# Patient Record
Sex: Female | Born: 1995 | Race: White | Hispanic: No | Marital: Single | Smoking: Never smoker
Health system: Southern US, Community
[De-identification: ages and names within clinical notes are randomized; demographics above are authoritative.]

## PROBLEM LIST (undated history)

## (undated) ENCOUNTER — Inpatient Hospital Stay: Payer: Self-pay

## (undated) DIAGNOSIS — IMO0001 Reserved for inherently not codable concepts without codable children: Secondary | ICD-10-CM

## (undated) DIAGNOSIS — M419 Scoliosis, unspecified: Secondary | ICD-10-CM

## (undated) DIAGNOSIS — F329 Major depressive disorder, single episode, unspecified: Secondary | ICD-10-CM

## (undated) DIAGNOSIS — O10919 Unspecified pre-existing hypertension complicating pregnancy, unspecified trimester: Secondary | ICD-10-CM

## (undated) DIAGNOSIS — F419 Anxiety disorder, unspecified: Secondary | ICD-10-CM

## (undated) DIAGNOSIS — O139 Gestational [pregnancy-induced] hypertension without significant proteinuria, unspecified trimester: Secondary | ICD-10-CM

## (undated) DIAGNOSIS — D649 Anemia, unspecified: Secondary | ICD-10-CM

## (undated) DIAGNOSIS — K219 Gastro-esophageal reflux disease without esophagitis: Secondary | ICD-10-CM

## (undated) DIAGNOSIS — R112 Nausea with vomiting, unspecified: Secondary | ICD-10-CM

## (undated) DIAGNOSIS — I10 Essential (primary) hypertension: Secondary | ICD-10-CM

## (undated) DIAGNOSIS — Z464 Encounter for fitting and adjustment of orthodontic device: Secondary | ICD-10-CM

## (undated) DIAGNOSIS — F3340 Major depressive disorder, recurrent, in remission, unspecified: Secondary | ICD-10-CM

## (undated) HISTORY — DX: Unspecified pre-existing hypertension complicating pregnancy, unspecified trimester: O10.919

## (undated) HISTORY — DX: Gastro-esophageal reflux disease without esophagitis: K21.9

## (undated) HISTORY — DX: Major depressive disorder, single episode, unspecified: F32.9

## (undated) HISTORY — DX: Gestational (pregnancy-induced) hypertension without significant proteinuria, unspecified trimester: O13.9

## (undated) HISTORY — PX: TONSILLECTOMY: SUR1361

## (undated) HISTORY — DX: Major depressive disorder, recurrent, in remission, unspecified: F33.40

## (undated) HISTORY — DX: Anemia, unspecified: D64.9

## (undated) HISTORY — DX: Anxiety disorder, unspecified: F41.9

## (undated) HISTORY — PX: NO PAST SURGERIES: SHX2092

---
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
## 2017-01-05 ENCOUNTER — Encounter: Payer: Self-pay | Admitting: Obstetrics and Gynecology

## 2017-01-05 ENCOUNTER — Ambulatory Visit (INDEPENDENT_AMBULATORY_CARE_PROVIDER_SITE_OTHER): Payer: Medicaid Other | Admitting: Obstetrics and Gynecology

## 2017-01-05 VITALS — BP 112/74 | Ht 63.0 in | Wt 170.0 lb

## 2017-01-05 DIAGNOSIS — N926 Irregular menstruation, unspecified: Secondary | ICD-10-CM | POA: Diagnosis not present

## 2017-01-05 DIAGNOSIS — Z113 Encounter for screening for infections with a predominantly sexual mode of transmission: Secondary | ICD-10-CM

## 2017-01-05 DIAGNOSIS — N920 Excessive and frequent menstruation with regular cycle: Secondary | ICD-10-CM | POA: Diagnosis not present

## 2017-01-05 NOTE — Progress Notes (Signed)
    HPI:      Ms. Susan Klein is a 21 y.o. G0P0000 NP who LMP was Patient's last menstrual period was 12/24/2016., presents today for irregular menses last month. She was seen in the ED for heavy bleeding with serum beta HcG. Her menses are usually monthly, lasting 3-4 days, changing pads/tampons QID. She has mild dysmen with menses and occas BTB. Her LMP came a wk early and was heavy and with clots. It still lasted 4 days. She denies any persistent bleeding, pain, cramping. No dyspareunia.She stopped OCPs a yr ago and had her IUD removed 1/18. She had monthly menses on IUD. She would like to conceive and is taking PNVs. She denies any vag sx, urin sx, fevers.   History reviewed. No pertinent past medical history.  History reviewed. No pertinent surgical history.  History reviewed. No pertinent family history.   ROS:  Review of Systems  Constitutional: Negative for fever, malaise/fatigue and weight loss.  Gastrointestinal: Negative for blood in stool, constipation, diarrhea, nausea and vomiting.  Genitourinary: Negative for dysuria, flank pain, frequency, hematuria and urgency.  Musculoskeletal: Positive for back pain.  Skin: Negative for itching and rash.    Objective: BP 112/74   Ht 5\' 3"  (1.6 m)   Wt 170 lb (77.1 kg)   LMP 12/24/2016   BMI 30.11 kg/m    Physical Exam  Genitourinary: There is no rash, tenderness, lesion or Bartholin's cyst on the right labia. There is no rash, tenderness, lesion or Bartholin's cyst on the left labia. No erythema, tenderness or bleeding in the vagina. No vaginal discharge found. Right adnexum does not display mass and does not display tenderness. Left adnexum does not display mass and does not display tenderness. Cervix does not exhibit motion tenderness, discharge or friability. Uterus is not enlarged or tender.  Nursing note and vitals reviewed.    Assessment/Plan: Irregular menses - With LMP. Neg preg test in ED. Check nuswab. If neg,  reassurance. F/u is sx persist for labs/u/s. - Plan: Chlamydia/Gonococcus/Trichomonas, NAA  Screening for STD (sexually transmitted disease) - Plan: Chlamydia/Gonococcus/Trichomonas, NAA  Menorrhagia with regular cycle    F/U  Return if symptoms worsen or fail to improve.  Alicia B. Copland, PA-C 01/05/2017 12:00 PM

## 2017-01-07 ENCOUNTER — Encounter: Payer: Self-pay | Admitting: Obstetrics and Gynecology

## 2017-01-07 LAB — CHLAMYDIA/GONOCOCCUS/TRICHOMONAS, NAA
Chlamydia by NAA: NEGATIVE
Gonococcus by NAA: NEGATIVE
Trich vag by NAA: NEGATIVE

## 2017-02-26 ENCOUNTER — Ambulatory Visit (INDEPENDENT_AMBULATORY_CARE_PROVIDER_SITE_OTHER): Payer: Medicaid Other | Admitting: Advanced Practice Midwife

## 2017-02-26 ENCOUNTER — Encounter: Payer: Medicaid Other | Admitting: Advanced Practice Midwife

## 2017-02-26 ENCOUNTER — Encounter: Payer: Self-pay | Admitting: Advanced Practice Midwife

## 2017-02-26 VITALS — BP 114/70 | Wt 174.0 lb

## 2017-02-26 DIAGNOSIS — Z3400 Encounter for supervision of normal first pregnancy, unspecified trimester: Secondary | ICD-10-CM

## 2017-02-26 DIAGNOSIS — Z3491 Encounter for supervision of normal pregnancy, unspecified, first trimester: Secondary | ICD-10-CM

## 2017-02-26 DIAGNOSIS — Z113 Encounter for screening for infections with a predominantly sexual mode of transmission: Secondary | ICD-10-CM

## 2017-02-26 DIAGNOSIS — O219 Vomiting of pregnancy, unspecified: Secondary | ICD-10-CM

## 2017-02-26 MED ORDER — DOXYLAMINE-PYRIDOXINE 10-10 MG PO TBEC: 2.0000 | DELAYED_RELEASE_TABLET | Freq: Every day | ORAL | 3 refills | Status: DC

## 2017-02-26 NOTE — Progress Notes (Signed)
NOB today.  

## 2017-02-26 NOTE — Progress Notes (Signed)
New Obstetric Patient H&P    Chief Complaint: "Desires prenatal care"   History of Present Illness: Patient is a 21 y.o. G1P0000 Not Hispanic or Latino female, LMP 01/20/2017 presents with amenorrhea and positive home pregnancy test. Based on her  LMP, her EDD is Estimated Date of Delivery: 10/27/2017. and her EGA is [redacted]w[redacted]d. Cycles are 5. days, regular, and occur approximately every : 28 days. She has never had a PAP smear due to her age.    She had a urine pregnancy test which was positive 1 week(s)  ago. Her last menstrual period was different and lasted for  3 day(s). Since her LMP she claims she has experienced breast tenderness, fatigue, nausea. She denies vaginal bleeding. Her past medical history is noncontributory. This is her first pregnancy.  Since her LMP, she admits to the use of tobacco products  no She claims she has gained   7 pounds since the start of her pregnancy.  There are cats in the home in the home  yes If yes Indoor She admits close contact with children on a regular basis  yes  She has had chicken pox in the past no She has had Tuberculosis exposures, symptoms, or previously tested positive for TB   no Current or past history of domestic violence. no  Genetic Screening/Teratology Counseling: (Includes patient, baby's father, or anyone in either family with:)   1. Patient's age >/= 80 at [facility]  no 2. Thalassemia ([place], [place], Mediterranean, or Asian background): MCV<80  no 3. Neural tube defect (meningomyelocele, spina bifida, anencephaly)  no 4. Congenital heart defect  no  5. Down syndrome  no 6. Tay-Sachs (Jewish, [place]  no 7. Canavan's Disease  no 8. Sickle cell disease or trait (African)  no  9. Hemophilia or other blood disorders  no  10. Muscular dystrophy  no  11. Cystic fibrosis  no  12. Huntington's Chorea  no  13. Mental retardation/autism  no 14. Other inherited genetic or chromosomal disorder  no 15. Maternal metabolic disorder  (DM, PKU, etc)  no 16. Patient or FOB with a child with a birth defect not listed above no  16a. Patient or FOB with a birth defect themselves no 17. Recurrent pregnancy loss, or stillbirth  no  18. Any medications since LMP other than prenatal vitamins (include vitamins, supplements, OTC meds, drugs, alcohol)  no 19. Any other genetic/environmental exposure to discuss  no  Infection History:   1. Lives with someone with TB or TB exposed  no  2. Patient or partner has history of genital herpes  no 3. Rash or viral illness since LMP  no 4. History of STI (GC, CT, HPV, syphilis, HIV)  no 5. History of recent travel :  no  Other pertinent information:  She plans to try breastfeeding and she has nipple piercings     Review of Systems:10 point review of systems negative unless otherwise noted in HPI  Past Medical History:  History reviewed. No pertinent past medical history.  Past Surgical History:  History reviewed. No pertinent surgical history.  Gynecologic History: Patient's last menstrual period was 01/20/2017.  Obstetric History: G1P0000  Family History:  History reviewed. No pertinent family history.  Social History:  Social History   Social History  . Marital status: Single    Spouse name: N/A  . Number of children: N/A  . Years of education: N/A   Occupational History  . Not on file.  Social History Main Topics  . Smoking status: Former Games developermoker  . Smokeless tobacco: Never Used  . Alcohol use No  . Drug use: No  . Sexual activity: Yes    Birth control/ protection: None   Other Topics Concern  . Not on file   Social History Narrative  . No narrative on file    Allergies:  Allergies  Allergen Reactions  . Mucinex [Guaifenesin Er] Hives    Medications: Prior to Admission medications   Medication Sig Start Date End Date Taking? Authorizing Provider  Doxylamine-Pyridoxine (DICLEGIS) 10-10 MG TBEC Take 2 tablets by mouth at bedtime. If symptoms  persist, add one tablet in the morning and one in the afternoon 02/26/17   Tresea MallGledhill, Lenzi Marmo, CNM    Physical Exam Vitals: Blood pressure 114/70, weight 174 lb (78.9 kg), last menstrual period 01/20/2017.  General: NAD HEENT: normocephalic, anicteric Thyroid: no enlargement, no palpable nodules Pulmonary: No increased work of breathing, CTAB Cardiovascular: RRR, distal pulses 2+ Abdomen: NABS, soft, non-tender, non-distended.  Umbilicus without lesions.  No hepatomegaly, splenomegaly or masses palpable. No evidence of hernia  Genitourinary:  External: Normal external female genitalia.  Normal urethral meatus, normal  Bartholin's and Skene's glands.    Vagina: Normal vaginal mucosa, no evidence of prolapse.    Cervix: Grossly normal in appearance, no bleeding, no CMT  Uterus: Enlarged, mobile, normal contour.  Feels larger than [redacted] weeks gestation  Adnexa: ovaries non-enlarged, no adnexal masses  Rectal: deferred Extremities: no edema, erythema, or tenderness Neurologic: Grossly intact Psychiatric: mood appropriate, affect full   Assessment: 21 y.o. G1P0000 at Unknown presenting to initiate prenatal care  Plan: 1) Avoid alcoholic beverages. 2) Patient encouraged not to smoke.  3) Discontinue the use of all non-medicinal drugs and chemicals.  4) Take prenatal vitamins daily.  5) Nutrition, food safety (fish, cheese advisories, and high nitrite foods) and exercise discussed. 6) Hospital and practice style discussed with cross coverage system.  7) Genetic Screening, such as with 1st Trimester Screening, cell free fetal DNA, AFP testing, and Ultrasound, as well as with amniocentesis and CVS as appropriate, is discussed with patient. At the conclusion of today's visit patient undecided genetic testing 8) Patient is asked about travel to areas at risk for the [place] virus, and counseled to avoid travel and exposure to mosquitoes or sexual partners who may have themselves been exposed to the virus.  Testing is discussed, and will be ordered as appropriate.    Tresea MallJane Mylie Mccurley, CNM

## 2017-02-26 NOTE — Progress Notes (Signed)
Here for NOB. c/o nausea, no vomiting. No samples of Diclegis available- Rx sent. Dating scan in 1 week

## 2017-02-26 NOTE — Patient Instructions (Signed)

## 2017-02-27 LAB — RPR+RH+ABO+RUB AB+AB SCR+CB...
Antibody Screen: NEGATIVE
HIV Screen 4th Generation wRfx: NONREACTIVE
Hematocrit: 40.6 % (ref 34.0–46.6)
Hemoglobin: 13.7 g/dL (ref 11.1–15.9)
Hepatitis B Surface Ag: NEGATIVE
MCH: 29.8 pg (ref 26.6–33.0)
MCHC: 33.7 g/dL (ref 31.5–35.7)
MCV: 89 fL (ref 79–97)
Platelets: 261 10*3/uL (ref 150–379)
RBC: 4.59 x10E6/uL (ref 3.77–5.28)
RDW: 13.4 % (ref 12.3–15.4)
RPR Ser Ql: NONREACTIVE
Rh Factor: POSITIVE
Rubella Antibodies, IGG: 5.81 index (ref >0.99)
Varicella zoster IgG: 208 index (ref >165)
WBC: 8.8 10*3/uL (ref 3.4–10.8)

## 2017-02-28 LAB — URINE CULTURE: Organism ID, Bacteria: NO GROWTH

## 2017-02-28 LAB — GC/CHLAMYDIA PROBE AMP
Chlamydia trachomatis, NAA: NEGATIVE
Neisseria gonorrhoeae by PCR: NEGATIVE

## 2017-03-04 ENCOUNTER — Ambulatory Visit (INDEPENDENT_AMBULATORY_CARE_PROVIDER_SITE_OTHER): Payer: Medicaid Other

## 2017-03-04 DIAGNOSIS — Z3491 Encounter for supervision of normal pregnancy, unspecified, first trimester: Secondary | ICD-10-CM | POA: Diagnosis not present

## 2017-03-10 ENCOUNTER — Telehealth: Payer: Self-pay | Admitting: Advanced Practice Midwife

## 2017-03-10 NOTE — Telephone Encounter (Signed)
Patient's called and said she was seen 2 weeks ago and was prescribed medication, but the prescription is still pending. [facility] [phone]

## 2017-03-10 NOTE — Telephone Encounter (Signed)
Pt aware diclegis required prior authorization through [employer]. Medication approved. Pt to contact pharmacy this afternoon.

## 2017-03-12 ENCOUNTER — Encounter: Payer: Self-pay | Admitting: Obstetrics and Gynecology

## 2017-03-12 ENCOUNTER — Ambulatory Visit (INDEPENDENT_AMBULATORY_CARE_PROVIDER_SITE_OTHER): Payer: Medicaid Other | Admitting: Obstetrics and Gynecology

## 2017-03-12 ENCOUNTER — Other Ambulatory Visit: Payer: Medicaid Other

## 2017-03-12 VITALS — BP 116/74 | Wt 176.0 lb

## 2017-03-12 DIAGNOSIS — F3340 Major depressive disorder, recurrent, in remission, unspecified: Secondary | ICD-10-CM

## 2017-03-12 DIAGNOSIS — Z3491 Encounter for supervision of normal pregnancy, unspecified, first trimester: Secondary | ICD-10-CM

## 2017-03-12 DIAGNOSIS — Z3A01 Less than 8 weeks gestation of pregnancy: Secondary | ICD-10-CM

## 2017-03-12 DIAGNOSIS — F419 Anxiety disorder, unspecified: Secondary | ICD-10-CM | POA: Insufficient documentation

## 2017-03-12 DIAGNOSIS — F5105 Insomnia due to other mental disorder: Secondary | ICD-10-CM

## 2017-03-12 DIAGNOSIS — O0990 Supervision of high risk pregnancy, unspecified, unspecified trimester: Secondary | ICD-10-CM | POA: Insufficient documentation

## 2017-03-12 HISTORY — DX: Major depressive disorder, recurrent, in remission, unspecified: F33.40

## 2017-03-12 NOTE — Progress Notes (Signed)
Dating scan last week. No vb. No lof.

## 2017-03-12 NOTE — Progress Notes (Signed)
No vb. No lof. U/s last week confirms EDD. Instructed on proper use of diclegis for nausea.  Still considering 1st trimester screen

## 2017-04-09 ENCOUNTER — Ambulatory Visit (INDEPENDENT_AMBULATORY_CARE_PROVIDER_SITE_OTHER): Payer: Medicaid Other | Admitting: Obstetrics & Gynecology

## 2017-04-09 VITALS — BP 120/80 | Wt 172.0 lb

## 2017-04-09 DIAGNOSIS — Z3A11 11 weeks gestation of pregnancy: Secondary | ICD-10-CM

## 2017-04-09 DIAGNOSIS — Z3490 Encounter for supervision of normal pregnancy, unspecified, unspecified trimester: Secondary | ICD-10-CM

## 2017-04-09 NOTE — Progress Notes (Signed)
PNV, First screen desired and scheduled

## 2017-04-21 ENCOUNTER — Ambulatory Visit (INDEPENDENT_AMBULATORY_CARE_PROVIDER_SITE_OTHER): Payer: Medicaid Other

## 2017-04-21 ENCOUNTER — Ambulatory Visit (INDEPENDENT_AMBULATORY_CARE_PROVIDER_SITE_OTHER): Payer: Medicaid Other | Admitting: Advanced Practice Midwife

## 2017-04-21 VITALS — BP 128/68 | Wt 170.0 lb

## 2017-04-21 DIAGNOSIS — Z3A13 13 weeks gestation of pregnancy: Secondary | ICD-10-CM

## 2017-04-21 DIAGNOSIS — Z362 Encounter for other antenatal screening follow-up: Secondary | ICD-10-CM

## 2017-04-21 DIAGNOSIS — Z3A11 11 weeks gestation of pregnancy: Secondary | ICD-10-CM | POA: Diagnosis not present

## 2017-04-21 DIAGNOSIS — Z1379 Encounter for other screening for genetic and chromosomal anomalies: Secondary | ICD-10-CM

## 2017-04-21 NOTE — Progress Notes (Signed)
ROB headaches First trimester screen U/S today

## 2017-04-21 NOTE — Progress Notes (Signed)
1st trimester screen today. Has had some headaches- new for pregnancy. Reviewed triggers and safe meds. Patient will start with increased hydration, increased sleep and tylenol. No LOF, VB. RTC in 4 weeks.

## 2017-04-25 LAB — FIRST TRIMESTER SCREEN W/NT
CRL: 71.1 mm
DIA MoM: 1.25
DIA Value: 261.2 pg/mL
Gest Age-Collect: 13 weeks
Maternal Age At EDD: 19 yr
Nuchal Translucency MoM: 1.2
Nuchal Translucency: 2.1 mm
Number of Fetuses: 1
PAPP-A MoM: 1.54
PAPP-A Value: 1490.6 ng/mL
Test Results:: NEGATIVE
Weight: 170 [lb_av]
hCG MoM: 1.21
hCG Value: 90.2 IU/mL

## 2017-05-19 ENCOUNTER — Ambulatory Visit (INDEPENDENT_AMBULATORY_CARE_PROVIDER_SITE_OTHER): Payer: Medicaid Other | Admitting: Obstetrics and Gynecology

## 2017-05-19 VITALS — BP 156/78 | Wt 173.0 lb

## 2017-05-19 DIAGNOSIS — O162 Unspecified maternal hypertension, second trimester: Secondary | ICD-10-CM

## 2017-05-19 DIAGNOSIS — Z3490 Encounter for supervision of normal pregnancy, unspecified, unspecified trimester: Secondary | ICD-10-CM

## 2017-05-19 DIAGNOSIS — Z3A17 17 weeks gestation of pregnancy: Secondary | ICD-10-CM

## 2017-05-19 NOTE — Progress Notes (Signed)
ROB

## 2017-05-19 NOTE — Patient Instructions (Signed)
Start low dose ASA at >[redacted] weeks gestation as per USPTF recommendation "Low-Dose Aspirin Use for the Prevention of Morbidity and Mortality From Preeclampsia: Preventive Medicine"  furthermore endorsed by ACOG, WHO, and NIH based on evidence level B for the prevention of preeclampsia  In women deemed high risk  (diabetes, renal disease, chronic hypertension, history of preeclampsia in prior gestation, autoimmune diseases, or multifetal gestations)

## 2017-05-19 NOTE — Progress Notes (Signed)
BP elevated on recheck as well no headaches.  Will check baseline preeclampsia labs, does have a history of elevated BP's preceeding pregnancy.  Repeat BP check in 1 week.  Given onset prior to 20 weeks patient reports prior history of elevated BP I would classify this as chronic HTN

## 2017-05-20 ENCOUNTER — Encounter: Payer: Self-pay | Admitting: Obstetrics and Gynecology

## 2017-05-20 LAB — COMPREHENSIVE METABOLIC PANEL
ALT: 12 IU/L (ref 0–32)
AST: 15 IU/L (ref 0–40)
Albumin/Globulin Ratio: 1.6 (ref 1.2–2.2)
Albumin: 3.9 g/dL (ref 3.5–5.5)
Alkaline Phosphatase: 58 IU/L (ref 43–101)
BUN/Creatinine Ratio: 14 (ref 9–23)
BUN: 6 mg/dL (ref 6–20)
Bilirubin Total: <0.2 mg/dL (ref 0.0–1.2)
CO2: 19 mmol/L — ABNORMAL LOW (ref 20–29)
Calcium: 9.2 mg/dL (ref 8.7–10.2)
Chloride: 104 mmol/L (ref 96–106)
Creatinine, Ser: 0.44 mg/dL — ABNORMAL LOW (ref 0.57–1.00)
GFR calc Af Amer: 170 mL/min/{1.73_m2} (ref >59)
GFR calc non Af Amer: 148 mL/min/{1.73_m2} (ref >59)
Globulin, Total: 2.4 g/dL (ref 1.5–4.5)
Glucose: 95 mg/dL (ref 65–99)
Potassium: 4.1 mmol/L (ref 3.5–5.2)
Sodium: 138 mmol/L (ref 134–144)
Total Protein: 6.3 g/dL (ref 6.0–8.5)

## 2017-05-20 LAB — PROTEIN / CREATININE RATIO, URINE
Creatinine, Urine: 145 mg/dL
Protein, Ur: 12.6 mg/dL
Protein/Creat Ratio: 87 mg/g creat (ref 0–200)

## 2017-05-20 LAB — CBC
Hematocrit: 35.7 % (ref 34.0–46.6)
Hemoglobin: 12.2 g/dL (ref 11.1–15.9)
MCH: 30.3 pg (ref 26.6–33.0)
MCHC: 34.2 g/dL (ref 31.5–35.7)
MCV: 89 fL (ref 79–97)
Platelets: 224 10*3/uL (ref 150–379)
RBC: 4.03 x10E6/uL (ref 3.77–5.28)
RDW: 13.8 % (ref 12.3–15.4)
WBC: 12.6 10*3/uL — ABNORMAL HIGH (ref 3.4–10.8)

## 2017-05-20 LAB — TSH: TSH: 0.865 u[IU]/mL (ref 0.450–4.500)

## 2017-05-28 ENCOUNTER — Ambulatory Visit (INDEPENDENT_AMBULATORY_CARE_PROVIDER_SITE_OTHER): Payer: Medicaid Other | Admitting: Advanced Practice Midwife

## 2017-05-28 VITALS — BP 138/84 | Wt 174.0 lb

## 2017-05-28 DIAGNOSIS — Z3A18 18 weeks gestation of pregnancy: Secondary | ICD-10-CM

## 2017-05-28 DIAGNOSIS — Z3490 Encounter for supervision of normal pregnancy, unspecified, unspecified trimester: Secondary | ICD-10-CM

## 2017-05-28 NOTE — Progress Notes (Signed)
BP Ok today. Discussed plan of care going forward and the possibility of adding antihypertensive if BP increases. Denies HA, change of vision. No Ctx's, LOF, VB. Anatomy scan in 2 weeks.

## 2017-05-28 NOTE — Progress Notes (Signed)
No vb. No lof.  

## 2017-06-06 ENCOUNTER — Observation Stay
Admission: EM | Admit: 2017-06-06 | Discharge: 2017-06-06 | Disposition: A | Discharge status: Home / Self Care | Payer: Medicaid Other | Attending: Certified Nurse Midwife | Admitting: Certified Nurse Midwife

## 2017-06-06 DIAGNOSIS — F419 Anxiety disorder, unspecified: Secondary | ICD-10-CM | POA: Insufficient documentation

## 2017-06-06 DIAGNOSIS — Z3A19 19 weeks gestation of pregnancy: Secondary | ICD-10-CM | POA: Diagnosis not present

## 2017-06-06 DIAGNOSIS — O162 Unspecified maternal hypertension, second trimester: Secondary | ICD-10-CM | POA: Diagnosis not present

## 2017-06-06 DIAGNOSIS — Z888 Allergy status to other drugs, medicaments and biological substances status: Secondary | ICD-10-CM | POA: Diagnosis not present

## 2017-06-06 DIAGNOSIS — O99342 Other mental disorders complicating pregnancy, second trimester: Secondary | ICD-10-CM | POA: Insufficient documentation

## 2017-06-06 DIAGNOSIS — F329 Major depressive disorder, single episode, unspecified: Secondary | ICD-10-CM | POA: Diagnosis not present

## 2017-06-06 DIAGNOSIS — O26892 Other specified pregnancy related conditions, second trimester: Secondary | ICD-10-CM | POA: Diagnosis not present

## 2017-06-06 NOTE — Final Progress Note (Signed)
Physician Final Progress Note  Patient ID: Susan Klein MRN: [id number] DOB/AGE: October 28, 1995 21 y.o.  Admit date: 06/06/2017 Admitting provider: Herold Harms, MD Discharge date: 06/06/2017   Admission Diagnoses: Elevated blood pressure in pregnancy in second trimester  Discharge Diagnoses:  Active Problems:   Elevated blood pressure complicating pregnancy in second trimester, antepartum Chronic hypertension  Consults: None  Significant Findings/ Diagnostic Studies: 21 year old G1 P0 with EDC=10/27/2017 (by LMP c/w [redacted]w[redacted]d [place]) presented from home at 19wk4d with c/o elevated blood pressure 138/92 and swelling in lower extremities. Face looked flush at home and she used her mother in law's BP cuff to take BP. Last two prenatal visits blood pressures have been elevated to 138/84 and 156/78. PIH labs on 05/19/2017 were normal with a PC ratio of 87.  Had a blood pressure of 167/60 in March 2018 when seen in the ER for vaginal bleeding. Has not been treated for hypertension in the past. Had a headache yesterday that responded to rest. No nausea/vomiting, visual changes, RUQ pain. Feeling some FM.Was at the beach all last week.  [facility]atal care at [facility] also remarkable for anxiety and depression.  Exam:  Patient Vitals for the past 24 hrs:  BP Temp Temp src Pulse Resp  06/06/17 2123 134/72 - - 94 -  06/06/17 2039 (!) 120/56 98.1 F (36.7 C) Oral 94 20   General: WF, appears nervous, but in NAD Heart: RRR without murmur Lungs: no respiratory difficulties/ CTAB Abd: FHTs WNL Ext: sunburned lower extremities, trace edema Neuro: alert, oriented, DTRs +1  A: IUP at 19wk4d with chronic hypertension  P: DC home Advised of diagnosis and will monitor closely during pregnancy for signs of worsening blood pressure and symptoms of preeclampsia Has appointment on 23 August for anatomy scan and followup.   Procedures: none  Discharge Condition: good  Disposition: 01-Home or Self Care  Diet:  Regular diet  Discharge Activity: Activity as tolerated  Discharge Instructions    Discharge patient    Complete by:  As directed    Discharge disposition:  01-Home or Self Care   Discharge patient date:  06/06/2017     Allergies as of 06/06/2017      Reactions   Mucinex [guaifenesin Er] Hives      Medication List    TAKE these medications   PRENATAL VITAMINS PO Take 1 tablet by mouth daily.        Total time spent taking care of this patient: 15 minutes  Signed: Farrel Conners 06/06/2017, 9:47 PM

## 2017-06-08 ENCOUNTER — Telehealth: Payer: Self-pay

## 2017-06-08 NOTE — Telephone Encounter (Signed)
Pt called after hour nurse c/o elevated BP of 138/92, pulse 100.  Pt was adv to go to L&D which she did and was sent home.  Pt has appt on the 23rd,  Saw CLG in L&D.

## 2017-06-11 ENCOUNTER — Ambulatory Visit (INDEPENDENT_AMBULATORY_CARE_PROVIDER_SITE_OTHER): Payer: Medicaid Other | Admitting: Advanced Practice Midwife

## 2017-06-11 ENCOUNTER — Ambulatory Visit (INDEPENDENT_AMBULATORY_CARE_PROVIDER_SITE_OTHER): Payer: Medicaid Other

## 2017-06-11 VITALS — BP 132/60 | Wt 178.0 lb

## 2017-06-11 DIAGNOSIS — Z3A20 20 weeks gestation of pregnancy: Secondary | ICD-10-CM

## 2017-06-11 DIAGNOSIS — Z362 Encounter for other antenatal screening follow-up: Secondary | ICD-10-CM

## 2017-06-11 DIAGNOSIS — IMO0002 Reserved for concepts with insufficient information to code with codable children: Secondary | ICD-10-CM

## 2017-06-11 DIAGNOSIS — Z3490 Encounter for supervision of normal pregnancy, unspecified, unspecified trimester: Secondary | ICD-10-CM

## 2017-06-11 DIAGNOSIS — Z048 Encounter for examination and observation for other specified reasons: Secondary | ICD-10-CM

## 2017-06-11 DIAGNOSIS — Z0489 Encounter for examination and observation for other specified reasons: Secondary | ICD-10-CM

## 2017-06-11 NOTE — Progress Notes (Signed)
Anatomy scan today incomplete for cardiac due to transverse position. F/U scan in 4 weeks. No ctx's, LOF, VB. Doing well, no complaints today.

## 2017-06-11 NOTE — Progress Notes (Signed)
Anatomy scan today

## 2017-07-09 ENCOUNTER — Ambulatory Visit (INDEPENDENT_AMBULATORY_CARE_PROVIDER_SITE_OTHER): Payer: Medicaid Other | Admitting: Certified Nurse Midwife

## 2017-07-09 ENCOUNTER — Ambulatory Visit (INDEPENDENT_AMBULATORY_CARE_PROVIDER_SITE_OTHER): Payer: Medicaid Other

## 2017-07-09 VITALS — BP 130/62 | Wt 180.0 lb

## 2017-07-09 DIAGNOSIS — O0990 Supervision of high risk pregnancy, unspecified, unspecified trimester: Secondary | ICD-10-CM

## 2017-07-09 DIAGNOSIS — IMO0002 Reserved for concepts with insufficient information to code with codable children: Secondary | ICD-10-CM

## 2017-07-09 DIAGNOSIS — Z3A24 24 weeks gestation of pregnancy: Secondary | ICD-10-CM

## 2017-07-09 DIAGNOSIS — Z048 Encounter for examination and observation for other specified reasons: Secondary | ICD-10-CM | POA: Diagnosis not present

## 2017-07-09 DIAGNOSIS — Z131 Encounter for screening for diabetes mellitus: Secondary | ICD-10-CM

## 2017-07-09 DIAGNOSIS — Z0489 Encounter for examination and observation for other specified reasons: Secondary | ICD-10-CM

## 2017-07-09 NOTE — Progress Notes (Signed)
Follow up anatomy scan today. Pt reports no problems.

## 2017-07-09 NOTE — Progress Notes (Signed)
21 yo G1 P0 now 24wk2d gestation presents for ROB and follow up anatomy scan Anatomy scan now complete and normal with anterior placenta Wants to breast feed-[facility] CBE classes [facility] -diagnosed this pregnancy(no meds)-BP 130/62 ROB and 28 week labs in 4 weeks

## 2017-08-06 ENCOUNTER — Other Ambulatory Visit: Payer: Medicaid Other

## 2017-08-06 ENCOUNTER — Ambulatory Visit (INDEPENDENT_AMBULATORY_CARE_PROVIDER_SITE_OTHER): Payer: Medicaid Other | Admitting: Certified Nurse Midwife

## 2017-08-06 VITALS — BP 138/68 | Wt 181.0 lb

## 2017-08-06 DIAGNOSIS — Z23 Encounter for immunization: Secondary | ICD-10-CM

## 2017-08-06 DIAGNOSIS — O0990 Supervision of high risk pregnancy, unspecified, unspecified trimester: Secondary | ICD-10-CM

## 2017-08-06 DIAGNOSIS — O10919 Unspecified pre-existing hypertension complicating pregnancy, unspecified trimester: Secondary | ICD-10-CM

## 2017-08-06 DIAGNOSIS — Z3A28 28 weeks gestation of pregnancy: Secondary | ICD-10-CM

## 2017-08-06 DIAGNOSIS — Z131 Encounter for screening for diabetes mellitus: Secondary | ICD-10-CM

## 2017-08-06 NOTE — Addendum Note (Signed)
Addended by: Reather LittlerUTHUS, Cortez Flippen D on: 08/06/2017 04:34 PM   Modules accepted: Orders

## 2017-08-06 NOTE — Progress Notes (Signed)
ROB and 28 week labs: [age] year old G1 at 28wk 2days. O POS blood type. [facility]not [facility] 138/68 today Doing well. Baby moving OK. Discussed trying to feel baby move 10x/day. Cord blood banking options explained. Desires to breast feed Contraceptive options explained ROB and growth scan in 2 weeks. Susan Klein, CNM

## 2017-08-06 NOTE — Progress Notes (Signed)
Pt reports no problems. 28 weeks labs today. Flu shot today.

## 2017-08-07 ENCOUNTER — Emergency Department
Admission: EM | Admit: 2017-08-07 | Discharge: 2017-08-07 | Disposition: A | Discharge status: Still In Hospital | Payer: No Typology Code available for payment source | Source: Home / Self Care | Attending: Emergency Medicine | Admitting: Emergency Medicine

## 2017-08-07 ENCOUNTER — Encounter: Payer: Self-pay | Admitting: Intensive Care

## 2017-08-07 ENCOUNTER — Observation Stay
Admission: EM | Admit: 2017-08-07 | Discharge: 2017-08-07 | Disposition: A | Discharge status: Home / Self Care | Payer: No Typology Code available for payment source | Attending: Obstetrics and Gynecology | Admitting: Obstetrics and Gynecology

## 2017-08-07 DIAGNOSIS — O163 Unspecified maternal hypertension, third trimester: Secondary | ICD-10-CM | POA: Diagnosis not present

## 2017-08-07 DIAGNOSIS — I10 Essential (primary) hypertension: Secondary | ICD-10-CM | POA: Diagnosis not present

## 2017-08-07 DIAGNOSIS — Z87891 Personal history of nicotine dependence: Secondary | ICD-10-CM | POA: Insufficient documentation

## 2017-08-07 DIAGNOSIS — Z3A28 28 weeks gestation of pregnancy: Secondary | ICD-10-CM | POA: Insufficient documentation

## 2017-08-07 DIAGNOSIS — O26893 Other specified pregnancy related conditions, third trimester: Secondary | ICD-10-CM | POA: Diagnosis not present

## 2017-08-07 DIAGNOSIS — R1031 Right lower quadrant pain: Secondary | ICD-10-CM | POA: Diagnosis not present

## 2017-08-07 HISTORY — DX: Essential (primary) hypertension: I10

## 2017-08-07 LAB — 28 WEEK RH+PANEL
Basophils Absolute: 0 10*3/uL (ref 0.0–0.2)
Basos: 0 %
EOS (ABSOLUTE): 0.1 10*3/uL (ref 0.0–0.4)
Eos: 1 %
Gestational Diabetes Screen: 88 mg/dL (ref 65–139)
HIV Screen 4th Generation wRfx: NONREACTIVE
Hematocrit: 35.5 % (ref 34.0–46.6)
Hemoglobin: 11.8 g/dL (ref 11.1–15.9)
Immature Grans (Abs): 0.1 10*3/uL (ref 0.0–0.1)
Immature Granulocytes: 1 %
Lymphocytes Absolute: 1.9 10*3/uL (ref 0.7–3.1)
Lymphs: 18 %
MCH: 29.6 pg (ref 26.6–33.0)
MCHC: 33.2 g/dL (ref 31.5–35.7)
MCV: 89 fL (ref 79–97)
Monocytes Absolute: 0.9 10*3/uL (ref 0.1–0.9)
Monocytes: 8 %
Neutrophils Absolute: 7.9 10*3/uL — ABNORMAL HIGH (ref 1.4–7.0)
Neutrophils: 72 %
Platelets: 239 10*3/uL (ref 150–379)
RBC: 3.99 x10E6/uL (ref 3.77–5.28)
RDW: 13.2 % (ref 12.3–15.4)
RPR Ser Ql: NONREACTIVE
WBC: 10.9 10*3/uL — ABNORMAL HIGH (ref 3.4–10.8)

## 2017-08-07 MED ORDER — ACETAMINOPHEN 325 MG PO TABS
650.0000 mg | ORAL_TABLET | Freq: Once | ORAL | Status: AC
  Administered 2017-08-07: 650 mg via ORAL
  Filled 2017-08-07: qty 2

## 2017-08-07 MED ORDER — ACETAMINOPHEN 325 MG PO TABS
650.0000 mg | ORAL_TABLET | ORAL | Status: DC | PRN
  Filled 2017-08-07: qty 2

## 2017-08-07 NOTE — Discharge Summary (Signed)
See Final Progress Note for 08/07/2017.  Marcelyn BruinsJacelyn Schmid, CNM 08/07/2017  7:38 PM

## 2017-08-07 NOTE — Discharge Instructions (Signed)
Discharge teaching with preterm labor precautions provided to pt per CNM and RN. Discussed in detail s/s to monitor for and report if noted. Advised pt to rest, stay hydrated and keep next OB appt. Monitor Blood Pressure and if elevated or higher than 160/100, recheck in about 15 -30 mins and write the number, call OB provider and report immediately if blood pressure remains high. Pt and s/o verbalized understanding.
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
## 2017-08-20 ENCOUNTER — Ambulatory Visit (INDEPENDENT_AMBULATORY_CARE_PROVIDER_SITE_OTHER): Payer: Medicaid Other | Admitting: Certified Nurse Midwife

## 2017-08-20 ENCOUNTER — Ambulatory Visit (INDEPENDENT_AMBULATORY_CARE_PROVIDER_SITE_OTHER): Payer: Medicaid Other

## 2017-08-20 VITALS — BP 130/66 | Wt 180.0 lb

## 2017-08-20 DIAGNOSIS — Z3A30 30 weeks gestation of pregnancy: Secondary | ICD-10-CM

## 2017-08-20 DIAGNOSIS — O0990 Supervision of high risk pregnancy, unspecified, unspecified trimester: Secondary | ICD-10-CM | POA: Diagnosis not present

## 2017-08-20 DIAGNOSIS — O10919 Unspecified pre-existing hypertension complicating pregnancy, unspecified trimester: Secondary | ICD-10-CM

## 2017-08-20 NOTE — Progress Notes (Signed)
Pt reports no problems. Growth scan today. Pt to get Tdap at [facility].

## 2017-09-03 ENCOUNTER — Ambulatory Visit (INDEPENDENT_AMBULATORY_CARE_PROVIDER_SITE_OTHER): Payer: Medicaid Other | Admitting: Certified Nurse Midwife

## 2017-09-03 VITALS — BP 126/62 | Wt 184.0 lb

## 2017-09-03 DIAGNOSIS — O0990 Supervision of high risk pregnancy, unspecified, unspecified trimester: Secondary | ICD-10-CM | POA: Diagnosis not present

## 2017-09-03 DIAGNOSIS — O10913 Unspecified pre-existing hypertension complicating pregnancy, third trimester: Secondary | ICD-10-CM

## 2017-09-03 DIAGNOSIS — O368130 Decreased fetal movements, third trimester, not applicable or unspecified: Secondary | ICD-10-CM

## 2017-09-03 DIAGNOSIS — Z3A32 32 weeks gestation of pregnancy: Secondary | ICD-10-CM

## 2017-09-03 NOTE — Progress Notes (Signed)
Pt reports no problems.   
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
## 2017-09-15 ENCOUNTER — Telehealth: Payer: Self-pay

## 2017-09-15 NOTE — Progress Notes (Signed)
ROB at [facility]30[facility]. [facility] BP 130/66 today, negative proteinuria 28 week labs OK Growth scan today: CGA 31 weeks, EFW 3#9oz (56.2%), AFI 17.75 cm Good FM Needs to get TDAP at [facility] ROB in 2 weeks Farrel Connersolleen Vaishali Baise, CNM

## 2017-09-15 NOTE — Telephone Encounter (Signed)
Spoke w/pt. She has nasal congestion, sore throat & headache x3 days. She has not taken anything. Advised of safe OTC meds she can use for this. Pt is allergic to Mucinex, so avoided those. Recommended Tylenol Sinus, Sudafed, Saline Nasal Spray, Robitussin for cough if needed. Pt has ROB apt on 09/21/17. Will f/u then or contact us prior if s&s worsen.

## 2017-09-15 NOTE — Telephone Encounter (Signed)
Pt states she is 34 wks & is getting sick. Inquiring if she needs to be seen. Cb#[phone]

## 2017-09-21 ENCOUNTER — Other Ambulatory Visit: Payer: Medicaid Other

## 2017-09-21 ENCOUNTER — Encounter: Payer: Medicaid Other | Admitting: Certified Nurse Midwife

## 2017-09-23 ENCOUNTER — Encounter: Payer: Medicaid Other | Admitting: Advanced Practice Midwife

## 2017-09-23 ENCOUNTER — Other Ambulatory Visit: Payer: Medicaid Other

## 2017-09-24 ENCOUNTER — Ambulatory Visit (INDEPENDENT_AMBULATORY_CARE_PROVIDER_SITE_OTHER): Payer: Medicaid Other | Admitting: Obstetrics and Gynecology

## 2017-09-24 ENCOUNTER — Ambulatory Visit (INDEPENDENT_AMBULATORY_CARE_PROVIDER_SITE_OTHER): Payer: Medicaid Other

## 2017-09-24 VITALS — BP 122/62 | Wt 185.0 lb

## 2017-09-24 DIAGNOSIS — O0990 Supervision of high risk pregnancy, unspecified, unspecified trimester: Secondary | ICD-10-CM

## 2017-09-24 DIAGNOSIS — O10913 Unspecified pre-existing hypertension complicating pregnancy, third trimester: Secondary | ICD-10-CM

## 2017-09-24 DIAGNOSIS — O10919 Unspecified pre-existing hypertension complicating pregnancy, unspecified trimester: Secondary | ICD-10-CM

## 2017-09-24 DIAGNOSIS — Z3A35 35 weeks gestation of pregnancy: Secondary | ICD-10-CM

## 2017-09-24 NOTE — Progress Notes (Signed)
Pt thinks she may have had one episode of spotting yesterday but she also shaved and unsure if that was related, also has intermittent downward shooting pain in pelvic area.  Growth scan today.

## 2017-09-25 NOTE — Progress Notes (Signed)
Routine Prenatal Care Visit  Subjective  Susan Klein is a 21 y.o. G1P0000 at 24108w3d being seen today for ongoing prenatal care.  She is currently monitored for the following issues for this high-risk pregnancy and has Supervision of high risk pregnancy, antepartum; Major depressive disorder, recurrent, in remission (HCC); Insomnia secondary to anxiety; Elevated blood pressure affecting pregnancy in second trimester, antepartum; Elevated blood pressure complicating pregnancy in second trimester, antepartum; Chronic hypertension during pregnancy, antepartum; and Indication for care in labor and delivery, antepartum on their problem list.  ----------------------------------------------------------------------------------- Patient reports small spotting after shaving yesterday, nothing since. Cautioned on shaving perineum and infetion risks. Growth scan today showed infant in the 28th%.   Contractions: Not present. Vag. Bleeding: None.  Movement: Present. Denies leaking of fluid.  ----------------------------------------------------------------------------------- The following portions of the patient's history were reviewed and updated as appropriate: allergies, current medications, past family history, past medical history, past social history, past surgical history and problem list. Problem list updated.   Objective  Blood pressure 122/62, weight 185 lb (83.9 kg), last menstrual period 01/20/2017. Pregravid weight 167 lb (75.8 kg) Total Weight Gain 18 lb (8.165 kg) Urinalysis: Urine Protein: Negative Urine Glucose: Negative  Fetal Status: Fetal Heart Rate (bpm): 135 Fundal Height: 34 cm Movement: Present  Presentation: Vertex  General:  Alert, oriented and cooperative. Patient is in no acute distress.  Skin: Skin is warm and dry. No rash noted.   Cardiovascular: Normal heart rate noted  Respiratory: Normal respiratory effort, no problems with respiration noted  Abdomen: Soft, gravid,  appropriate for gestational age. Pain/Pressure: Absent     Pelvic:  Cervical exam deferred        Extremities: Normal range of motion.     ental Status: Normal mood and affect. Normal behavior. Normal judgment and thought content.     Assessment   21 y.o. G1P0000 at 9108w3d by  10/27/2017, by Last Menstrual Period presenting for routine prenatal visit  Plan   pregnancy Problems (from 02/26/17 to present)    Problem Noted Resolved   Supervision of high risk pregnancy, antepartum 03/12/2017 by Conard NovakJackson, Stephen D, MD No   Overview Addendum 09/24/2017  5:19 PM by Natale MilchSchuman, Markevious Ehmke R, MD    [facility] Prenatal Labs  Dating LMP c/w 6 weeks Blood type: O/Positive/-- (05/10 1003)   Genetic Screen 1 Screen: desired [  ]        NIPS: Antibody:Negative (05/10 1003)  Anatomic US Normal, anterior placenta Rubella: 5.81 (05/10 1003) Varicella: I  GTT Early:               Third trimester: 88 RPR: Non Reactive (05/10 1003)   Rhogam  HBsAg: Negative (05/10 1003)   TDaP vaccine                       Flu Shot: HIV: Non Reactive (05/10 1003)   Baby Food       Breast                         GBS:   Contraception  Pap:  CBB     CS/VBAC    Support Person               Major depressive disorder, recurrent, in remission (HCC) 03/12/2017 by Conard NovakJackson, Stephen D, MD No   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 by Conard NovakJackson, Stephen D, MD No       Preterm labor  symptoms and general obstetric precautions including but not limited to vaginal bleeding, contractions, leaking of fluid and fetal movement were reviewed in detail with the patient. Please refer to After Visit Summary for other counseling recommendations.   Return in about 1 week (around 10/01/2017) for ROB.

## 2017-10-01 ENCOUNTER — Encounter: Payer: Self-pay | Admitting: Obstetrics and Gynecology

## 2017-10-01 ENCOUNTER — Ambulatory Visit (INDEPENDENT_AMBULATORY_CARE_PROVIDER_SITE_OTHER): Payer: Medicaid Other | Admitting: Obstetrics and Gynecology

## 2017-10-01 VITALS — BP 124/70 | Wt 190.0 lb

## 2017-10-01 DIAGNOSIS — O10919 Unspecified pre-existing hypertension complicating pregnancy, unspecified trimester: Secondary | ICD-10-CM

## 2017-10-01 DIAGNOSIS — F3340 Major depressive disorder, recurrent, in remission, unspecified: Secondary | ICD-10-CM

## 2017-10-01 DIAGNOSIS — Z3A36 36 weeks gestation of pregnancy: Secondary | ICD-10-CM

## 2017-10-01 DIAGNOSIS — O0990 Supervision of high risk pregnancy, unspecified, unspecified trimester: Secondary | ICD-10-CM

## 2017-10-01 NOTE — Progress Notes (Signed)
Routine Prenatal Care Visit  Subjective  Susan Klein is a 21 y.o. G1P0000 at 3171w2d being seen today for ongoing prenatal care.  She is currently monitored for the following issues for this high-risk pregnancy and has Supervision of high risk pregnancy, antepartum; Major depressive disorder, recurrent, in remission (HCC); Insomnia secondary to anxiety; Elevated blood pressure affecting pregnancy in second trimester, antepartum; Elevated blood pressure complicating pregnancy in second trimester, antepartum; Chronic hypertension during pregnancy, antepartum; and Indication for care in labor and delivery, antepartum on their problem list.  ----------------------------------------------------------------------------------- Patient reports no complaints.   Contractions: Not present. Vag. Bleeding: None.   . Denies leaking of fluid.  ----------------------------------------------------------------------------------- The following portions of the patient's history were reviewed and updated as appropriate: allergies, current medications, past family history, past medical history, past social history, past surgical history and problem list. Problem list updated.   Objective  Blood pressure 124/70, weight 190 lb (86.2 kg), last menstrual period 01/20/2017. Pregravid weight 167 lb (75.8 kg) Total Weight Gain 23 lb (10.4 kg) Urinalysis: Urine Protein: Negative Urine Glucose: Negative  Fetal Status:           General:  Alert, oriented and cooperative. Patient is in no acute distress.  Skin: Skin is warm and dry. No rash noted.   Cardiovascular: Normal heart rate noted  Respiratory: Normal respiratory effort, no problems with respiration noted  Abdomen: Soft, gravid, appropriate for gestational age.       Pelvic:  Cervical exam deferred        Extremities: Normal range of motion.     Mental Status: Normal mood and affect. Normal behavior. Normal judgment and thought content.   Assessment   21  y.o. G1P0000 at 2871w2d by  10/27/2017, by Last Menstrual Period presenting for routine prenatal visit  Plan   pregnancy Problems (from 02/26/17 to present)    Problem Noted Resolved   Chronic hypertension during pregnancy, antepartum 08/06/2017 by Farrel ConnersGutierrez, Colleen, CNM No   Overview Signed 10/01/2017  3:46 PM by Conard NovakJackson, Anthonie Lotito D, MD    [ ]  if no issues, on no meds, deliver by 7019w0d-[redacted]w[redacted]d per ACOG      Supervision of high risk pregnancy, antepartum 03/12/2017 by Conard NovakJackson, Romy Mcgue D, MD No   Overview Addendum 09/24/2017  5:19 PM by Natale MilchSchuman, Christanna R, MD    [facility] Prenatal Labs  Dating LMP c/w 6 weeks Blood type: O/Positive/-- (05/10 1003)   Genetic Screen 1 Screen: desired [  ]        NIPS: Antibody:Negative (05/10 1003)  Anatomic US Normal, anterior placenta Rubella: 5.81 (05/10 1003) Varicella: I  GTT Early:               Third trimester: 88 RPR: Non Reactive (05/10 1003)   Rhogam  HBsAg: Negative (05/10 1003)   TDaP vaccine                       Flu Shot: HIV: Non Reactive (05/10 1003)   Baby Food       Breast                         GBS:   Contraception  Pap:  CBB     CS/VBAC    Support Person               Major depressive disorder, recurrent, in remission (HCC) 03/12/2017 by Conard NovakJackson, Kaoru Rezendes D, MD No   Insomnia secondary to  anxiety 03/12/2017 by Conard NovakJackson, Iesha Summerhill D, MD No     Preterm labor symptoms and general obstetric precautions including but not limited to vaginal bleeding, contractions, leaking of fluid and fetal movement were reviewed in detail with the patient. Please refer to After Visit Summary for other counseling recommendations.   Return in about 1 week (around 10/08/2017) for Routine Prenatal Appointment.  - discussed diagnosis of CHTN prior to 20 weeks.  Normotensive since.  But, would give consideration to delivery by EDD.   -GBS/Aptima today  Thomasene MohairStephen Kullen Tomasetti, MD  10/01/2017 3:58 PM

## 2017-10-03 LAB — STREP GP B NAA: Strep Gp B NAA: NEGATIVE

## 2017-10-04 LAB — GC/CHLAMYDIA PROBE AMP
Chlamydia trachomatis, NAA: NEGATIVE
Neisseria gonorrhoeae by PCR: NEGATIVE

## 2017-10-09 ENCOUNTER — Encounter: Payer: Self-pay | Admitting: Obstetrics and Gynecology

## 2017-10-09 ENCOUNTER — Ambulatory Visit (INDEPENDENT_AMBULATORY_CARE_PROVIDER_SITE_OTHER): Payer: Medicaid Other | Admitting: Obstetrics and Gynecology

## 2017-10-09 VITALS — BP 126/80 | Wt 190.0 lb

## 2017-10-09 DIAGNOSIS — O10919 Unspecified pre-existing hypertension complicating pregnancy, unspecified trimester: Secondary | ICD-10-CM

## 2017-10-09 DIAGNOSIS — F3340 Major depressive disorder, recurrent, in remission, unspecified: Secondary | ICD-10-CM

## 2017-10-09 DIAGNOSIS — Z3A37 37 weeks gestation of pregnancy: Secondary | ICD-10-CM

## 2017-10-09 DIAGNOSIS — O0990 Supervision of high risk pregnancy, unspecified, unspecified trimester: Secondary | ICD-10-CM

## 2017-10-09 NOTE — Progress Notes (Signed)
Routine Prenatal Care Visit  Subjective  Susan Klein is a 21 y.o. G1P0000 at 10467w3d being seen today for ongoing prenatal care.  She is currently monitored for the following issues for this high-risk pregnancy and has Supervision of high risk pregnancy, antepartum; Major depressive disorder, recurrent, in remission (HCC); Insomnia secondary to anxiety; Elevated blood pressure affecting pregnancy in second trimester, antepartum; Elevated blood pressure complicating pregnancy in second trimester, antepartum; Chronic hypertension during pregnancy, antepartum; and Indication for care in labor and delivery, antepartum on their problem list.  ----------------------------------------------------------------------------------- Patient reports mild nausea with some cramping. Thinks she lost her mucus plug.   Contractions: Not present. Vag. Bleeding: None.  Movement: Present. Denies leaking of fluid.  ----------------------------------------------------------------------------------- The following portions of the patient's history were reviewed and updated as appropriate: allergies, current medications, past family history, past medical history, past social history, past surgical history and problem list. Problem list updated.   Objective  Blood pressure 126/80, weight 190 lb (86.2 kg), last menstrual period 01/20/2017. Pregravid weight 167 lb (75.8 kg) Total Weight Gain 23 lb (10.4 kg) Urinalysis: Urine Protein: Negative Urine Glucose: Negative  Fetal Status: Fetal Heart Rate (bpm): 145 Fundal Height: 37 cm Movement: Present  Presentation: Vertex  General:  Alert, oriented and cooperative. Patient is in no acute distress.  Skin: Skin is warm and dry. No rash noted.   Cardiovascular: Normal heart rate noted  Respiratory: Normal respiratory effort, no problems with respiration noted  Abdomen: Soft, gravid, appropriate for gestational age. Pain/Pressure: Absent     Pelvic:  Cervical exam deferred         Extremities: Normal range of motion.     Mental Status: Normal mood and affect. Normal behavior. Normal judgment and thought content.   Assessment   21 y.o. G1P0000 at 2967w3d by  10/27/2017, by Last Menstrual Period presenting for routine prenatal visit  Plan   pregnancy Problems (from 02/26/17 to present)    Problem Noted Resolved   Chronic hypertension during pregnancy, antepartum 08/06/2017 by Farrel ConnersGutierrez, Colleen, CNM No   Overview Signed 10/01/2017  3:46 PM by Conard NovakJackson, Marija Calamari D, MD    [ ]  if no issues, on no meds, deliver by 4166w0d-[redacted]w[redacted]d per ACOG      Supervision of high risk pregnancy, antepartum 03/12/2017 by Conard NovakJackson, Manette Doto D, MD No   Overview Addendum 10/08/2017  2:17 PM by Conard NovakJackson, Kieley Akter D, MD    Clinic [facility] Prenatal Labs  Dating LMP c/w 6 weeks Blood type: O/Positive/-- (05/10 1003)   Genetic Screen 1 Screen: desired [  ]        NIPS: Antibody:Negative (05/10 1003)  Anatomic US Normal, anterior placenta Rubella: 5.81 (05/10 1003) Varicella: I  GTT Early:               Third trimester: 88 RPR: Non Reactive (10/18 1510)   Rhogam  HBsAg: Negative (05/10 1003)   TDaP vaccine                       Flu Shot: HIV:   Neg  Baby Food       Breast                         ZOX:[id number]:Negative (12/13 1605)  Contraception  Pap:  CBB     CS/VBAC    Support Person               Major depressive disorder, recurrent, in remission (  HCC) 03/12/2017 by Conard NovakJackson, Jene Oravec D, MD No   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 by Conard NovakJackson, Marabeth Melland D, MD No       Term labor symptoms and general obstetric precautions including but not limited to vaginal bleeding, contractions, leaking of fluid and fetal movement were reviewed in detail with the patient. Please refer to After Visit Summary for other counseling recommendations.   Return in about 1 week (around 10/16/2017) for Routine Prenatal Appointment.   Arrange IOL by 7686w0d next visit. Pre-e precautions given.   Thomasene MohairStephen Spirit Wernli, MD   10/09/2017 3:56 PM

## 2017-10-16 ENCOUNTER — Encounter: Payer: Self-pay | Admitting: Obstetrics and Gynecology

## 2017-10-16 ENCOUNTER — Ambulatory Visit (INDEPENDENT_AMBULATORY_CARE_PROVIDER_SITE_OTHER): Payer: Medicaid Other | Admitting: Obstetrics and Gynecology

## 2017-10-16 VITALS — BP 124/70 | Wt 192.0 lb

## 2017-10-16 DIAGNOSIS — O0990 Supervision of high risk pregnancy, unspecified, unspecified trimester: Secondary | ICD-10-CM

## 2017-10-16 DIAGNOSIS — F3340 Major depressive disorder, recurrent, in remission, unspecified: Secondary | ICD-10-CM

## 2017-10-16 DIAGNOSIS — Z3A38 38 weeks gestation of pregnancy: Secondary | ICD-10-CM

## 2017-10-16 DIAGNOSIS — O10919 Unspecified pre-existing hypertension complicating pregnancy, unspecified trimester: Secondary | ICD-10-CM

## 2017-10-16 NOTE — Progress Notes (Signed)
Routine Prenatal Care Visit  Subjective  Susan Klein is a 21 y.o. G1P0000 at 3584w3d being seen today for ongoing prenatal care.  She is currently monitored for the following issues for this high-risk pregnancy and has Supervision of high risk pregnancy, antepartum; Major depressive disorder, recurrent, in remission (HCC); Insomnia secondary to anxiety; Elevated blood pressure affecting pregnancy in second trimester, antepartum; Elevated blood pressure complicating pregnancy in second trimester, antepartum; Chronic hypertension during pregnancy, antepartum; and Indication for care in labor and delivery, antepartum on their problem list.  ----------------------------------------------------------------------------------- Patient reports no complaints.   Contractions: Not present. Vag. Bleeding: None.  Movement: Present. Denies leaking of fluid.  ----------------------------------------------------------------------------------- The following portions of the patient's history were reviewed and updated as appropriate: allergies, current medications, past family history, past medical history, past social history, past surgical history and problem list. Problem list updated.   Objective  Blood pressure 124/70, weight 192 lb (87.1 kg), last menstrual period 01/20/2017. Pregravid weight 167 lb (75.8 kg) Total Weight Gain 25 lb (11.3 kg) Urinalysis: Urine Protein: Negative Urine Glucose: Negative  Fetal Status: Fetal Heart Rate (bpm): 140 Fundal Height: 38 cm Movement: Present  Presentation: Vertex  General:  Alert, oriented and cooperative. Patient is in no acute distress.  Skin: Skin is warm and dry. No rash noted.   Cardiovascular: Normal heart rate noted  Respiratory: Normal respiratory effort, no problems with respiration noted  Abdomen: Soft, gravid, appropriate for gestational age. Pain/Pressure: Absent     Pelvic:  Cervical exam deferred        Extremities: Normal range of motion.      Mental Status: Normal mood and affect. Normal behavior. Normal judgment and thought content.   Assessment   21 y.o. G1P0000 at 4684w3d by  10/27/2017, by Last Menstrual Period presenting for routine prenatal visit  Plan   pregnancy Problems (from 02/26/17 to present)    Problem Noted Resolved   Chronic hypertension during pregnancy, antepartum 08/06/2017 by Farrel ConnersGutierrez, Colleen, CNM No   Overview Signed 10/01/2017  3:46 PM by Conard NovakJackson, Abram Sax D, MD    [ ]  if no issues, on no meds, deliver by 4746w0d-[redacted]w[redacted]d per ACOG      Supervision of high risk pregnancy, antepartum 03/12/2017 by Conard NovakJackson, Bryanda Mikel D, MD No   Overview Addendum 10/08/2017  2:17 PM by Conard NovakJackson, Mccormick Macon D, MD    Clinic [facility] Prenatal Labs  Dating LMP c/w 6 weeks Blood type: O/Positive/-- (05/10 1003)   Genetic Screen 1 Screen: desired [  ]        NIPS: Antibody:Negative (05/10 1003)  Anatomic US Normal, anterior placenta Rubella: 5.81 (05/10 1003) Varicella: I  GTT Early:               Third trimester: 88 RPR: Non Reactive (10/18 1510)   Rhogam  HBsAg: Negative (05/10 1003)   TDaP vaccine                       Flu Shot: HIV:   Neg  Baby Food       Breast                         ZOX:WRUEAVWUGBS:Negative (12/13 1605)  Contraception  Pap:  CBB     CS/VBAC    Support Person               Major depressive disorder, recurrent, in remission (HCC) 03/12/2017 by Conard NovakJackson, Crosby Oriordan D, MD No  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 by Conard NovakJackson, Kalai Baca D, MD No       Term labor symptoms and general obstetric precautions including but not limited to vaginal bleeding, contractions, leaking of fluid and fetal movement were reviewed in detail with the patient. Please refer to After Visit Summary for other counseling recommendations.   Return in about 5 days (around 10/21/2017) for Routine Prenatal Appointment.  - with diagnosis of CHTN, consider IOL by 292w6d per ACOG.  However, BPs have been very normal since 7/31 diagnosis. One visit to L&D in  October showed some mild elevation.  But, patient is asymptomatic and normotensive.  Consideration could go to giving her a little longer to go into spontaneous labor.  Will re-assess next week and make a plan from there.   Thomasene MohairStephen Priscillia Fouch, MD  10/16/2017 5:11 PM

## 2017-10-20 NOTE — L&D Delivery Note (Signed)
Obstetrical Delivery Note   Date of Delivery:   10/22/2017 Primary OB:   [facility] Gestational Age/EDD: 1753w2d (Dated by LMP) Antepartum complications: chronic hypertension, anxiety, depression  Delivered By:   Farrel Connersolleen Jaisa Defino, CNM  Delivery Type:   spontaneous vaginal delivery  Procedure Details:   CTSP with increasing rectal pressure. C/C/+2 on exam. Mother pushed to deliver a viable female infant in OA with nuchal cord x1 and left arm cord x1 reduced during delivery. Baby placed on mother's abdomen and dried. After delayed cord clamping, cord was cut by FOB. Baby placed skin to skin on mother's chest. Small right labial laceration repaired with 3-0 chromic. No vaginal or cervical lacerations seen. EBL 350 ml Anesthesia:    epidural Intrapartum complications: chronic hypertension with worsening blood pressures requiring IV labetalol x 1 GBS:    negative Laceration:    labial Episiotomy:    none Placenta:    Via active 3rd stage. To pathology: no Estimated Blood Loss:  350 ml Baby:    Liveborn female, Apgars 8/9,/[name]/ 7#2.3oz    Farrel Connersolleen Vyncent Overby, CNM

## 2017-10-21 ENCOUNTER — Inpatient Hospital Stay
Admission: EM | Admit: 2017-10-21 | Discharge: 2017-10-24 | DRG: 806 | Disposition: A | Discharge status: Home / Self Care | Payer: Medicaid Other | Attending: Certified Nurse Midwife | Admitting: Certified Nurse Midwife

## 2017-10-21 ENCOUNTER — Ambulatory Visit (INDEPENDENT_AMBULATORY_CARE_PROVIDER_SITE_OTHER): Payer: Self-pay | Admitting: Obstetrics and Gynecology

## 2017-10-21 ENCOUNTER — Other Ambulatory Visit: Payer: Self-pay

## 2017-10-21 ENCOUNTER — Telehealth: Payer: Self-pay

## 2017-10-21 VITALS — BP 166/80 | Wt 195.0 lb

## 2017-10-21 DIAGNOSIS — F419 Anxiety disorder, unspecified: Secondary | ICD-10-CM | POA: Diagnosis present

## 2017-10-21 DIAGNOSIS — O1002 Pre-existing essential hypertension complicating childbirth: Secondary | ICD-10-CM | POA: Diagnosis present

## 2017-10-21 DIAGNOSIS — F3340 Major depressive disorder, recurrent, in remission, unspecified: Secondary | ICD-10-CM

## 2017-10-21 DIAGNOSIS — Z87891 Personal history of nicotine dependence: Secondary | ICD-10-CM

## 2017-10-21 DIAGNOSIS — O99344 Other mental disorders complicating childbirth: Secondary | ICD-10-CM | POA: Diagnosis present

## 2017-10-21 DIAGNOSIS — Z3A39 39 weeks gestation of pregnancy: Secondary | ICD-10-CM

## 2017-10-21 DIAGNOSIS — D62 Acute posthemorrhagic anemia: Secondary | ICD-10-CM | POA: Diagnosis not present

## 2017-10-21 DIAGNOSIS — O10919 Unspecified pre-existing hypertension complicating pregnancy, unspecified trimester: Secondary | ICD-10-CM

## 2017-10-21 DIAGNOSIS — O114 Pre-existing hypertension with pre-eclampsia, complicating childbirth: Secondary | ICD-10-CM | POA: Diagnosis not present

## 2017-10-21 DIAGNOSIS — F329 Major depressive disorder, single episode, unspecified: Secondary | ICD-10-CM | POA: Diagnosis present

## 2017-10-21 DIAGNOSIS — O139 Gestational [pregnancy-induced] hypertension without significant proteinuria, unspecified trimester: Secondary | ICD-10-CM | POA: Diagnosis present

## 2017-10-21 DIAGNOSIS — O0990 Supervision of high risk pregnancy, unspecified, unspecified trimester: Secondary | ICD-10-CM

## 2017-10-21 DIAGNOSIS — F5105 Insomnia due to other mental disorder: Secondary | ICD-10-CM | POA: Diagnosis present

## 2017-10-21 DIAGNOSIS — O163 Unspecified maternal hypertension, third trimester: Secondary | ICD-10-CM

## 2017-10-21 DIAGNOSIS — O9081 Anemia of the puerperium: Secondary | ICD-10-CM | POA: Diagnosis not present

## 2017-10-21 LAB — COMPREHENSIVE METABOLIC PANEL
ALT: 13 U/L — ABNORMAL LOW (ref 14–54)
AST: 24 U/L (ref 15–41)
Albumin: 2.8 g/dL — ABNORMAL LOW (ref 3.5–5.0)
Alkaline Phosphatase: 154 U/L — ABNORMAL HIGH (ref 38–126)
Anion gap: 9 (ref 5–15)
BUN: 8 mg/dL (ref 6–20)
CO2: 18 mmol/L — ABNORMAL LOW (ref 22–32)
Calcium: 8.6 mg/dL — ABNORMAL LOW (ref 8.9–10.3)
Chloride: 108 mmol/L (ref 101–111)
Creatinine, Ser: 0.52 mg/dL (ref 0.44–1.00)
GFR calc Af Amer: >60 mL/min (ref >60)
GFR calc non Af Amer: >60 mL/min (ref >60)
Glucose, Bld: 67 mg/dL (ref 65–99)
Potassium: 3.8 mmol/L (ref 3.5–5.1)
Sodium: 135 mmol/L (ref 135–145)
Total Bilirubin: 0.2 mg/dL — ABNORMAL LOW (ref 0.3–1.2)
Total Protein: 6 g/dL — ABNORMAL LOW (ref 6.5–8.1)

## 2017-10-21 LAB — CBC
HCT: 34.7 % — ABNORMAL LOW (ref 35.0–47.0)
Hemoglobin: 11.5 g/dL — ABNORMAL LOW (ref 12.0–16.0)
MCH: 28.4 pg (ref 26.0–34.0)
MCHC: 33.2 g/dL (ref 32.0–36.0)
MCV: 85.4 fL (ref 80.0–100.0)
Platelets: 187 10*3/uL (ref 150–440)
RBC: 4.06 MIL/uL (ref 3.80–5.20)
RDW: 14.7 % — ABNORMAL HIGH (ref 11.5–14.5)
WBC: 10.2 10*3/uL (ref 3.6–11.0)

## 2017-10-21 LAB — TYPE AND SCREEN
ABO/RH(D): O POS
Antibody Screen: NEGATIVE

## 2017-10-21 LAB — PROTEIN / CREATININE RATIO, URINE
Creatinine, Urine: 103 mg/dL
Protein Creatinine Ratio: 0.15 mg/mg{creat} (ref 0.00–0.15)
Total Protein, Urine: 15 mg/dL

## 2017-10-21 MED ORDER — ACETAMINOPHEN 325 MG PO TABS: 650.0000 mg | ORAL_TABLET | ORAL | Status: DC | PRN

## 2017-10-21 MED ORDER — LACTATED RINGERS IV SOLN
INTRAVENOUS | Status: DC
  Administered 2017-10-21 – 2017-10-22 (×2): via INTRAVENOUS
  Administered 2017-10-22: 1000 mL via INTRAVENOUS

## 2017-10-21 MED ORDER — MISOPROSTOL 200 MCG PO TABS
ORAL_TABLET | ORAL | Status: AC
  Filled 2017-10-21: qty 4

## 2017-10-21 MED ORDER — AMMONIA AROMATIC IN INHA
RESPIRATORY_TRACT | Status: AC
  Filled 2017-10-21: qty 10

## 2017-10-21 MED ORDER — OXYTOCIN BOLUS FROM INFUSION: 500.0000 mL | Freq: Once | INTRAVENOUS | Status: DC

## 2017-10-21 MED ORDER — LACTATED RINGERS IV SOLN: 500.0000 mL | INTRAVENOUS | Status: DC | PRN

## 2017-10-21 MED ORDER — LABETALOL HCL 5 MG/ML IV SOLN
40.0000 mg | Freq: Once | INTRAVENOUS | Status: DC | PRN
  Filled 2017-10-21: qty 8

## 2017-10-21 MED ORDER — FENTANYL CITRATE (PF) 100 MCG/2ML IJ SOLN: 50.0000 ug | INTRAMUSCULAR | Status: DC | PRN

## 2017-10-21 MED ORDER — LIDOCAINE HCL (PF) 1 % IJ SOLN
INTRAMUSCULAR | Status: AC
  Filled 2017-10-21: qty 30

## 2017-10-21 MED ORDER — ONDANSETRON HCL 4 MG/2ML IJ SOLN
4.0000 mg | Freq: Four times a day (QID) | INTRAMUSCULAR | Status: DC | PRN
  Administered 2017-10-22: 4 mg via INTRAVENOUS
  Filled 2017-10-21: qty 2

## 2017-10-21 MED ORDER — OXYTOCIN 10 UNIT/ML IJ SOLN
INTRAMUSCULAR | Status: AC
  Filled 2017-10-21: qty 2

## 2017-10-21 MED ORDER — LABETALOL HCL 5 MG/ML IV SOLN
20.0000 mg | Freq: Once | INTRAVENOUS | Status: AC | PRN
  Administered 2017-10-21: 20 mg via INTRAVENOUS
  Filled 2017-10-21: qty 4

## 2017-10-21 MED ORDER — LABETALOL HCL 5 MG/ML IV SOLN
80.0000 mg | Freq: Once | INTRAVENOUS | Status: DC | PRN
  Filled 2017-10-21: qty 16

## 2017-10-21 MED ORDER — OXYTOCIN 40 UNITS IN LACTATED RINGERS INFUSION - SIMPLE MED
2.5000 [IU]/h | INTRAVENOUS | Status: DC
  Filled 2017-10-21 (×2): qty 1000

## 2017-10-21 MED ORDER — OXYTOCIN 40 UNITS IN LACTATED RINGERS INFUSION - SIMPLE MED
1.0000 m[IU]/min | INTRAVENOUS | Status: DC
  Administered 2017-10-21: 2 m[IU]/min via INTRAVENOUS

## 2017-10-21 MED ORDER — TERBUTALINE SULFATE 1 MG/ML IJ SOLN: 0.2500 mg | Freq: Once | INTRAMUSCULAR | Status: DC | PRN
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
## 2017-10-21 NOTE — OB Triage Note (Signed)
7961w1d, [facility]P0[facility] pressure and possible induction d/t elevated bp. Reports positive fetal movement. Pt reporting "spotting a little bit because she just swept my membranes." Denies pain. Initial blood pressure 155/88. Monitors applied assessing.

## 2017-10-21 NOTE — Progress Notes (Signed)
Routine Prenatal Care Visit  Subjective  Susan Klein is a 22 y.o. G1P0000 at [redacted]w[redacted]d being seen today for ongoing prenatal care.  She is currently monitored for the following issues for this high-risk pregnancy and has Supervision of high risk pregnancy, antepartum; Major depressive disorder, recurrent, in remission (HCC); Insomnia secondary to anxiety; Elevated blood pressure affecting pregnancy in second trimester, antepartum; Elevated blood pressure complicating pregnancy in second trimester, antepartum; Chronic hypertension during pregnancy, antepartum; and Indication for care in labor and delivery, antepartum on their problem list.  ----------------------------------------------------------------------------------- Patient reports no complaints.  Denies headache, denies vision changes, denies right upper quadrant pain. Repeat BP was 160/70.  Contractions: Not present. Vag. Bleeding: None.  Movement: Present. Denies leaking of fluid.  ----------------------------------------------------------------------------------- The following portions of the patient's history were reviewed and updated as appropriate: allergies, current medications, past family history, past medical history, past social history, past surgical history and problem list. Problem list updated.   Objective  Blood pressure (!) 166/80, weight 195 lb (88.5 kg), last menstrual period 01/20/2017. Pregravid weight 167 lb (75.8 kg) Total Weight Gain 28 lb (12.7 kg) Urinalysis: Urine Protein: 1+ Urine Glucose: Negative  Fetal Status: Fetal Heart Rate (bpm): 140 Fundal Height: 39 cm Movement: Present  Presentation: Vertex  General:  Alert, oriented and cooperative. Patient is in no acute distress.  Skin: Skin is warm and dry. No rash noted.   Cardiovascular: Normal heart rate noted  Respiratory: Normal respiratory effort, no problems with respiration noted  Abdomen: Soft, gravid, appropriate for gestational age. Pain/Pressure:  Absent     Pelvic:  Cervical exam performed Dilation: 2.5 Effacement (%): 50 Station: -3  Extremities: Normal range of motion.     ental Status: Normal mood and affect. Normal behavior. Normal judgment and thought content.     Assessment   22 y.o. G1P0000 at [redacted]w[redacted]d by  10/27/2017, by Last Menstrual Period presenting for routine prenatal visit  Plan   pregnancy Problems (from 02/26/17 to present)    Problem Noted Resolved   Chronic hypertension during pregnancy, antepartum 08/06/2017 by Farrel Conners, CNM No   Overview Signed 10/01/2017  3:46 PM by Conard Novak, MD    [ ]  if no issues, on no meds, deliver by [redacted]w[redacted]d-[redacted]w[redacted]d per ACOG      Supervision of high risk pregnancy, antepartum 03/12/2017 by Conard Novak, MD No   Overview Addendum 10/08/2017  2:17 PM by Conard Novak, MD    Clinic [facility] Prenatal Labs  Dating LMP c/w 6 weeks Blood type: O/Positive/-- (05/10 1003)   Genetic Screen 1 Screen: desired [  ]        NIPS: Antibody:Negative (05/10 1003)  Anatomic [place] Normal, anterior placenta Rubella: 5.81 (05/10 1003) Varicella: I  GTT Early:               Third trimester: 88 RPR: Non Reactive (10/18 1510)   Rhogam  HBsAg: Negative (05/10 1003)   TDaP vaccine                       Flu Shot: HIV:   Neg  Baby Food       Breast                         WUJ:[id number] (12/13 1605)  Contraception  Pap:  CBB     CS/VBAC    Support Person  Major depressive disorder, recurrent, in remission (HCC) 03/12/2017 by Conard NovakJackson, Stephen D, MD No   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 by Conard NovakJackson, Stephen D, MD No       Term labor symptoms and general obstetric precautions including but not limited to vaginal bleeding, contractions, leaking of fluid and fetal movement were reviewed in detail with the patient. Please refer to After Visit Summary for other counseling recommendations.   Patient had two elevated BP in severe range. Will induce for elevated BP. Asked  patient to proceed directly to the hospital within the hour. Rule out pre-eclampsia labs.   Discussed preeclampsia warning signs and risks with patient.   Return in about 8 days (around 10/29/2017) for postpartum bp check.   Adelene Idlerhristanna Schuman MD  10/21/17 5:06 PM

## 2017-10-22 ENCOUNTER — Inpatient Hospital Stay: Payer: Medicaid Other | Admitting: Anesthesiology

## 2017-10-22 ENCOUNTER — Encounter: Payer: Self-pay | Admitting: Anesthesiology

## 2017-10-22 DIAGNOSIS — Z3A39 39 weeks gestation of pregnancy: Secondary | ICD-10-CM

## 2017-10-22 DIAGNOSIS — O114 Pre-existing hypertension with pre-eclampsia, complicating childbirth: Secondary | ICD-10-CM

## 2017-10-22 MED ORDER — PRENATAL MULTIVITAMIN CH
1.0000 | ORAL_TABLET | Freq: Every day | ORAL | Status: DC
  Administered 2017-10-23 – 2017-10-24 (×2): 1 via ORAL
  Filled 2017-10-22 (×2): qty 1

## 2017-10-22 MED ORDER — FENTANYL 2.5 MCG/ML W/ROPIVACAINE 0.15% IN NS 100 ML EPIDURAL (ARMC)
12.0000 mL/h | EPIDURAL | Status: DC
  Administered 2017-10-22: 12 mL/h via EPIDURAL
  Filled 2017-10-22: qty 100

## 2017-10-22 MED ORDER — PHENYLEPHRINE 40 MCG/ML (10ML) SYRINGE FOR IV PUSH (FOR BLOOD PRESSURE SUPPORT)
80.0000 ug | PREFILLED_SYRINGE | INTRAVENOUS | Status: DC | PRN
  Filled 2017-10-22: qty 5

## 2017-10-22 MED ORDER — FENTANYL 2.5 MCG/ML W/ROPIVACAINE 0.15% IN NS 100 ML EPIDURAL (ARMC)
EPIDURAL | Status: DC | PRN
  Administered 2017-10-22: 12 mL/h via EPIDURAL

## 2017-10-22 MED ORDER — SODIUM CHLORIDE 0.9 % IV SOLN
INTRAVENOUS | Status: DC | PRN
  Administered 2017-10-22 (×2): 5 mL via EPIDURAL

## 2017-10-22 MED ORDER — BENZOCAINE-MENTHOL 20-0.5 % EX AERO
1.0000 "application " | INHALATION_SPRAY | CUTANEOUS | Status: DC | PRN
  Administered 2017-10-22: 1 via TOPICAL
  Filled 2017-10-22: qty 56

## 2017-10-22 MED ORDER — FENTANYL 2.5 MCG/ML W/ROPIVACAINE 0.15% IN NS 100 ML EPIDURAL (ARMC)
EPIDURAL | Status: AC
  Filled 2017-10-22: qty 100

## 2017-10-22 MED ORDER — FERROUS SULFATE 325 (65 FE) MG PO TABS
325.0000 mg | ORAL_TABLET | Freq: Every day | ORAL | Status: DC
  Administered 2017-10-23 – 2017-10-24 (×2): 325 mg via ORAL
  Filled 2017-10-22 (×2): qty 1

## 2017-10-22 MED ORDER — ONDANSETRON HCL 4 MG PO TABS: 4.0000 mg | ORAL_TABLET | ORAL | Status: DC | PRN

## 2017-10-22 MED ORDER — LACTATED RINGERS IV SOLN: 500.0000 mL | Freq: Once | INTRAVENOUS | Status: DC

## 2017-10-22 MED ORDER — MISOPROSTOL 200 MCG PO TABS
ORAL_TABLET | ORAL | Status: AC
  Filled 2017-10-22: qty 4

## 2017-10-22 MED ORDER — LIDOCAINE HCL (PF) 1 % IJ SOLN
INTRAMUSCULAR | Status: DC | PRN
  Administered 2017-10-22 (×2): 1 mL via INTRADERMAL

## 2017-10-22 MED ORDER — WITCH HAZEL-GLYCERIN EX PADS: 1.0000 "application " | MEDICATED_PAD | CUTANEOUS | Status: DC | PRN

## 2017-10-22 MED ORDER — EPHEDRINE 5 MG/ML INJ
10.0000 mg | INTRAVENOUS | Status: DC | PRN
Start: 2017-10-22 — End: 2017-10-22
  Filled 2017-10-22: qty 2

## 2017-10-22 MED ORDER — EPHEDRINE 5 MG/ML INJ
10.0000 mg | INTRAVENOUS | Status: DC | PRN
  Filled 2017-10-22: qty 2

## 2017-10-22 MED ORDER — LIDOCAINE HCL (PF) 1 % IJ SOLN
INTRAMUSCULAR | Status: AC
  Filled 2017-10-22: qty 30

## 2017-10-22 MED ORDER — COCONUT OIL OIL
1.0000 "application " | TOPICAL_OIL | Status: DC | PRN
  Administered 2017-10-22: 1 via TOPICAL
  Filled 2017-10-22: qty 120

## 2017-10-22 MED ORDER — SIMETHICONE 80 MG PO CHEW: 80.0000 mg | CHEWABLE_TABLET | ORAL | Status: DC | PRN

## 2017-10-22 MED ORDER — ONDANSETRON HCL 4 MG/2ML IJ SOLN: 4.0000 mg | INTRAMUSCULAR | Status: DC | PRN

## 2017-10-22 MED ORDER — DIPHENHYDRAMINE HCL 50 MG/ML IJ SOLN: 12.5000 mg | INTRAMUSCULAR | Status: DC | PRN

## 2017-10-22 MED ORDER — SENNOSIDES-DOCUSATE SODIUM 8.6-50 MG PO TABS
2.0000 | ORAL_TABLET | ORAL | Status: DC
  Administered 2017-10-23 – 2017-10-24 (×2): 2 via ORAL
  Filled 2017-10-22 (×2): qty 2

## 2017-10-22 MED ORDER — AMMONIA AROMATIC IN INHA
RESPIRATORY_TRACT | Status: AC
  Filled 2017-10-22: qty 10

## 2017-10-22 MED ORDER — DIBUCAINE 1 % RE OINT: 1.0000 "application " | TOPICAL_OINTMENT | RECTAL | Status: DC | PRN

## 2017-10-22 MED ORDER — LIDOCAINE-EPINEPHRINE (PF) 1.5 %-1:200000 IJ SOLN
INTRAMUSCULAR | Status: DC | PRN
  Administered 2017-10-22: 3 mL via EPIDURAL

## 2017-10-22 MED ORDER — IBUPROFEN 600 MG PO TABS
600.0000 mg | ORAL_TABLET | Freq: Four times a day (QID) | ORAL | Status: DC
  Administered 2017-10-22 – 2017-10-24 (×8): 600 mg via ORAL
  Filled 2017-10-22 (×8): qty 1

## 2017-10-22 MED ORDER — OXYTOCIN 10 UNIT/ML IJ SOLN
INTRAMUSCULAR | Status: AC
  Filled 2017-10-22: qty 2

## 2017-10-22 MED ORDER — TETANUS-DIPHTH-ACELL PERTUSSIS 5-2.5-18.5 LF-MCG/0.5 IM SUSP
0.5000 mL | Freq: Once | INTRAMUSCULAR | Status: AC
  Administered 2017-10-24: 0.5 mL via INTRAMUSCULAR
  Filled 2017-10-22: qty 0.5
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
## 2017-10-22 NOTE — Progress Notes (Signed)
  Labor Progress Note   22 y.o. G1P0000 @ 4770w2d , admitted for  Pregnancy, Labor Management. Induction of labor for chronic hypertension with elevated blood pressures.  Subjective:  Resting comfortably with epidural. Family at bedside.  Objective:  BP 123/62   Pulse 88   Temp 98.5 F (36.9 C) (Oral)   Resp 18   Ht 5\' 3"  (1.6 m)   Wt 195 lb (88.5 kg)   LMP 01/20/2017   SpO2 98%   BMI 34.54 kg/m  Abd: moderate Extr: trace to 1+ bilateral pedal edema SVE: CERVIX:4 cm dilated, 80% effaced, -2 station, cervical position middle (RN exam) MEMBRANES: intact  EFM: FHR: 140 bpm, variability: moderate,  accelerations:  Present,  decelerations:  Absent Toco: Frequency: Every 2-3 minutes, Duration: 60-80 seconds and Intensity: moderate Labs: I have reviewed the patient's lab results.   Assessment & Plan:  G1P0000 @ 2870w2d, admitted for  Pregnancy and Labor/Delivery Management, induction of labor for chronic hypertension with elevated blood pressures.  1. Pain management: epidural. 2. FWB: FHT category I.  3. ID: GBS negative 4. Labor management: continue Pitocin induction, monitor blood pressure and treat as appropriate.  Marcelyn BruinsJacelyn Brendi Mccarroll, CNM 10/22/2017  6:19 AM

## 2017-10-22 NOTE — Progress Notes (Signed)
22 year old G1 P0 admitted for IOL for chronic hypertension with elevated blood pressures.  S: Resting well after epidural. Nurse states that patient received one dose of IV labetalol since admission, but that blood pressures have been normal since epidural.  O: 130/58, 129/65, 111/50. 98.6-98-18  General: appears comfortable and in NAD FHR: 145 with moderate variability, positive scalp stimulation with FHR acceleration to 180. Occasional late decel?-not repetitive.  Toco: contractions q2-3 min apart on 20 miu/min Pitocin  Cervix: 5-6cm/70%/-1 to -2  A: Progressing in induction CHTN-normotensive  P: AROM for moderate clear AF IUPC inserted Pitocin decreased to 10 miu/min- Baby had some variable decelerations after AROM while on back and after turning to her right side. Turning her to left resolved the variables. Currently having some early decelerations with baseline of 140. Will continue to monitor FWB, and if FHR strip reassuring, will titrate Pitocin to get adequate pattern   Farrel Connersolleen Yaniv Lage, CNM

## 2017-10-22 NOTE — Discharge Instructions (Signed)
°Vaginal Delivery, Care After °Refer to this sheet in the next few weeks. These discharge instructions provide you with information on caring for yourself after delivery. Your caregiver may also give you specific instructions. Your treatment has been planned according to the most current medical practices available, but problems sometimes occur. Call your caregiver if you have any problems or questions after you go home. °HOME CARE INSTRUCTIONS °1. Take over-the-counter or prescription medicines only as directed by your caregiver or pharmacist. °2. Do not drink alcohol, especially if you are breastfeeding or taking medicine to relieve pain. °3. Do not smoke tobacco. °4. Continue to use good perineal care. Good perineal care includes: °1. Wiping your perineum from back to front °2. Keeping your perineum clean. °3. You can do sitz baths twice a day, to help keep this area clean °5. Do not use tampons, douche or have sex for 6 weeks °6. Shower only and avoid sitting in submerged water, aside from sitz baths °7. Wear a well-fitting bra that provides breast support. °8. Eat healthy foods. °9. Drink enough fluids to keep your urine clear or pale yellow. °10. Eat high-fiber foods such as whole grain cereals and breads, brown rice, beans, and fresh fruits and vegetables every day. These foods may help prevent or relieve constipation. °11. Avoid constipation with high fiber foods or medications, such as miralax or metamucil °12. Follow your caregiver's recommendations regarding resumption of activities such as climbing stairs, driving, lifting, exercising, or traveling. °13. Talk to your caregiver about resuming sexual activities. Resumption of sexual activities after 6 weeks is dependent upon your risk of infection, your rate of healing, and your comfort and desire to resume sexual activity. °14. Try to have someone help you with your household activities and your newborn for at least a few days after you leave the  hospital. °15. Rest as much as possible. Try to rest or take a nap when your newborn is sleeping. °16. Increase your activities gradually. °17. Keep all of your scheduled postpartum appointments. It is very important to keep your scheduled follow-up appointments. At these appointments, your caregiver will be checking to make sure that you are healing physically and emotionally. °SEEK MEDICAL CARE IF:  °· You are passing large clots from your vagina. Save any clots to show your caregiver. °· You have a foul smelling discharge from your vagina. °· You have trouble urinating. °· You are urinating frequently. °· You have pain when you urinate. °· You have a change in your bowel movements. °· You have increasing redness, pain, or swelling near your vaginal incision (episiotomy) or vaginal tear. °· You have pus draining from your episiotomy or vaginal tear. °· Your episiotomy or vaginal tear is separating. °· You have painful, hard, or reddened breasts. °· You have a severe headache. °· You have blurred vision or see spots. °· You feel sad or depressed. °· You have thoughts of hurting yourself or your newborn. °· You have questions about your care, the care of your newborn, or medicines. °· You are dizzy or light-headed. °· You have a rash. °· You have nausea or vomiting. °· You were breastfeeding and have not had a menstrual period within 12 weeks after you stopped breastfeeding. °· You are not breastfeeding and have not had a menstrual period by the 12th week after delivery. °· You have a fever of 100.5 or more °SEEK IMMEDIATE MEDICAL CARE IF:  °· You have persistent pain. °· You have chest pain. °· You have shortness   of breath. °· You faint. °· You have leg pain. °· You have stomach pain. °· Your vaginal bleeding saturates two or more sanitary pads in 1 hour. °MAKE SURE YOU:  °· Understand these instructions. °· Will watch your condition. °· Will get help right away if you are not doing well or get worse. °Document  Released: 10/03/2000 Document Revised: 02/20/2014 Document Reviewed: 06/02/2012 °ExitCare® Patient Information ©2015 [employer]. This information is not intended to replace advice given to you by your health care provider. Make sure you discuss any questions you have with your health care provider. ° °Sitz Bath °A sitz bath is a warm water bath taken in the sitting position. The water covers only the hips and butt (buttocks). We recommend using one that fits in the toilet, to help with ease of use and cleanliness. It may be used for either healing or cleaning purposes. Sitz baths are also used to relieve pain, itching, or muscle tightening (spasms). The water may contain medicine. Moist heat will help you heal and relax.  °HOME CARE  °Take 3 to 4 sitz baths a day. °18. Fill the bathtub half-full with warm water. °19. Sit in the water and open the drain a little. °20. Turn on the warm water to keep the tub half-full. Keep the water running constantly. °21. Soak in the water for 15 to 20 minutes. °22. After the sitz bath, pat the affected area dry. °GET HELP RIGHT AWAY IF: °You get worse instead of better. Stop the sitz baths if you get worse. °MAKE SURE YOU: °· Understand these instructions. °· Will watch your condition. °· Will get help right away if you are not doing well or get worse. °Document Released: 11/13/2004 Document Revised: 06/30/2012 Document Reviewed: 02/03/2011 °ExitCare® Patient Information ©2015 [employer]. This information is not intended to replace advice given to you by your health care provider. Make sure you discuss any questions you have with your health care provider. ° ° °

## 2017-10-22 NOTE — Anesthesia Procedure Notes (Signed)
Epidural Patient location during procedure: OB Start time: 10/22/2017 4:22 AM End time: 10/22/2017 4:29 AM  Staffing Anesthesiologist: Piscitello, Cleda MccreedyJoseph K, MD Performed: anesthesiologist   Preanesthetic Checklist Completed: patient identified, site marked, surgical consent, pre-op evaluation, timeout performed, IV checked, risks and benefits discussed and monitors and equipment checked  Epidural Patient position: sitting Prep: Betadine Patient monitoring: heart rate, continuous pulse ox and blood pressure Approach: midline Location: L4-L5 Injection technique: LOR saline  Needle:  Needle type: Tuohy  Needle gauge: 17 G Needle length: 9 cm and 9 Needle insertion depth: 7 cm Catheter type: closed end flexible Catheter size: 19 Gauge Catheter at skin depth: 12 cm Test dose: negative and 1.5% lidocaine with Epi 1:200 K  Assessment Sensory level: T10 Events: blood not aspirated, injection not painful, no injection resistance, negative IV test and no paresthesia  Additional Notes Patient reports history of scoliosis  2 attempts Pt. Evaluated and documentation done after procedure finished. Patient identified. Risks/Benefits/Options discussed with patient including but not limited to bleeding, infection, nerve damage, paralysis, failed block, incomplete pain control, headache, blood pressure changes, nausea, vomiting, reactions to medication both or allergic, itching and postpartum back pain. Confirmed with bedside nurse the patient's most recent platelet count. Confirmed with patient that they are not currently taking any anticoagulation, have any bleeding history or any family history of bleeding disorders. Patient expressed understanding and wished to proceed. All questions were answered. Sterile technique was used throughout the entire procedure. Please see nursing notes for vital signs. Test dose was given through epidural catheter and negative prior to continuing to dose epidural or  start infusion. Warning signs of high block given to the patient including shortness of breath, tingling/numbness in hands, complete motor block, or any concerning symptoms with instructions to call for help. Patient was given instructions on fall risk and not to get out of bed. All questions and concerns addressed with instructions to call with any issues or inadequate analgesia.   Patient tolerated the insertion well without immediate complications.Reason for block:procedure for pain

## 2017-10-22 NOTE — Discharge Summary (Signed)
4Physician Obstetric Discharge Summary  Patient ID: Susan Klein MRN: [id number] DOB/AGE: 08-Apr-1996 21 y.o.   Date of Admission: 10/21/2017 Date of Delivery: 10/22/2017 Date of Discharge:   Admitting Diagnosis: Induction of labor at 7465w2d  Secondary Diagnosis: Chronic hypertension with worsening blood pressures  Mode of Delivery: normal spontaneous vaginal delivery 10/22/2017      Discharge Diagnosis: Term intrauterine pregnancy delivered at 39wk2d   Intrapartum Procedures: Atificial rupture of membranes, epidural, placement of intrauterine catheter and Pitocin induction, and repair of right labial superficial laceration   Post partum procedures: none  Complications: none   Brief Hospital Course  Susan Klein is a G1P1001 who had a SVD on 10/22/2017 after a Pitocin induction for worsening blood pressures/ chronic hypertension;  for further details of this delivery, please refer to the delivery note.  Patient had an uncomplicated postpartum course.  By time of discharge on PPD#2, her pain was controlled on oral pain medications; she had appropriate lochia and was ambulating, voiding without difficulty and tolerating regular diet.  She was deemed stable for discharge to home.    Labs: CBC Latest Ref Rng & Units 10/23/2017 10/21/2017 08/06/2017  WBC 3.6 - 11.0 K/uL 10.4 10.2 10.9(H)  Hemoglobin 12.0 - 16.0 g/dL 8.2(N9.6(L) 11.5(L) 11.8  Hematocrit 35.0 - 47.0 % 30.2(L) 34.7(L) 35.5  Platelets 150 - 440 K/uL 146(L) 187 239   O POS  Physical exam:  Blood pressure 138/84, pulse 91, temperature 97.6 F (36.4 C), temperature source Oral, resp. rate 18, height 5\' 3"  (1.6 m), weight 195 lb (88.5 kg), last menstrual period 01/20/2017, SpO2 99 %, unknown if currently breastfeeding. General: alert and no distress Lochia: appropriate Abdomen: soft, NT Uterine Fundus: firm Extremities: No evidence of DVT seen on physical exam. No lower extremity edema.  Discharge Instructions: Per After Visit  Summary. Activity: Advance as tolerated. Pelvic rest for 6 weeks.  Also refer to Discharge Instructions Diet: Regular Medications: Allergies as of 10/24/2017      Reactions   Mucinex [guaifenesin Er] Hives      Medication List    TAKE these medications   PRENATAL VITAMINS PO Take 1 tablet by mouth daily.      Outpatient follow up:  Follow-up Information    Schuman, Jaquelyn Bitterhristanna R, MD. Go in 1 week(s).   Specialty:  Obstetrics and Gynecology Contact information: [address] [place] [address] [phone]          Postpartum contraception: no method  Discharged Condition: good  Discharged to: home   Newborn Data: [name]/ 7#2.3oz Disposition:home with mother  Apgars: APGAR (1 MIN): 8   APGAR (5 MINS): 9   APGAR (10 MINS):    Baby Feeding: Breast  Natale Milchhristanna R Schuman, MD 10/24/2017 1:26 PM

## 2017-10-23 ENCOUNTER — Encounter: Payer: Self-pay | Admitting: Certified Nurse Midwife

## 2017-10-23 LAB — CBC
HCT: 30.2 % — ABNORMAL LOW (ref 35.0–47.0)
Hemoglobin: 9.6 g/dL — ABNORMAL LOW (ref 12.0–16.0)
MCH: 27.7 pg (ref 26.0–34.0)
MCHC: 32 g/dL (ref 32.0–36.0)
MCV: 86.5 fL (ref 80.0–100.0)
Platelets: 146 10*3/uL — ABNORMAL LOW (ref 150–440)
RBC: 3.49 MIL/uL — ABNORMAL LOW (ref 3.80–5.20)
RDW: 15.2 % — ABNORMAL HIGH (ref 11.5–14.5)
WBC: 10.4 10*3/uL (ref 3.6–11.0)

## 2017-10-23 LAB — RPR: RPR Ser Ql: NONREACTIVE

## 2017-10-23 NOTE — Plan of Care (Signed)
Vs stable; up ad lib now; taking scheduled motrin for pain control; breastfeeding and needs some assistance

## 2017-10-23 NOTE — Progress Notes (Signed)
Patient ID: Susan Klein, female   DOB: 12/27/1995, 22 y.o.   MRN: [id number] Obstetric Postpartum Daily Progress Note Subjective:  22 y.o. G1P1001 postpartum day #1 status post vaginal delivery.  She is ambulating, is tolerating po, is voiding spontaneously.  Her pain is well controlled on PO pain medications. Her lochia is less than menses.  Denies HA, visual changes, and RUQ pain.   Medications SCHEDULED MEDICATIONS  . ferrous sulfate  325 mg Oral Q breakfast  . ibuprofen  600 mg Oral Q6H  . prenatal multivitamin  1 tablet Oral Q1200  . senna-docusate  2 tablet Oral Q24H  . Tdap  0.5 mL Intramuscular Once    MEDICATION INFUSIONS    PRN MEDICATIONS  benzocaine-Menthol, coconut oil, witch hazel-glycerin **AND** dibucaine, ondansetron **OR** ondansetron (ZOFRAN) IV, simethicone    Objective:   Vitals:   10/22/17 2156 10/23/17 0109 10/23/17 0433 10/23/17 0851  BP: 134/62 119/61 117/65 (!) 112/56  Pulse: (!) 104 98 88 92  Resp: 16 17 17    Temp: 98.7 F (37.1 C) 98 F (36.7 C) 98.4 F (36.9 C) 97.8 F (36.6 C)  TempSrc: Oral Oral Oral Oral  SpO2: 98% 99% 100% 99%  Weight:      Height:        Current Vital Signs 24h Vital Sign Ranges  T 97.8 F (36.6 C) Temp  Avg: 98.5 F (36.9 C)  Min: 97.8 F (36.6 C)  Max: 99.5 F (37.5 C)  BP (!) 112/56 BP  Min: 112/56  Max: 148/82  HR 92 Pulse  Avg: 92  Min: 74  Max: 104  RR 17 Resp  Avg: 16.3  Min: 16  Max: 17  SaO2 99 % Not Delivered SpO2  Avg: 98.6 %  Min: 98 %  Max: 100 %       24 Hour I/O Current Shift I/O  Time Ins Outs 01/03 0701 - 01/04 0700 In: 2328 [I.V.:2328] Out: 1750 [Urine:1400] No intake/output data recorded.  General: NAD Pulmonary: no increased work of breathing Abdomen: non-distended, non-tender, fundus firm at level of umbilicus Extremities: no edema, no erythema, no tenderness  Labs:  Recent Labs  Lab 10/21/17 1809 10/23/17 0524  WBC 10.2 10.4  HGB 11.5* 9.6*  HCT 34.7* 30.2*  PLT 187 146*      Assessment:   22 y.o. G1P1001 postpartum day # 1 status post SVD, also with superimposed preeclampsia not requiring medication right now  Plan:   1) Acute blood loss anemia - hemodynamically stable and asymptomatic - po ferrous sulfate  2) O POS / Rubella 5.81 (05/10 1003)/ Varicella Immune  3) TDAP status did not receive during pregnancy, but would like prior to discharge (ordered) Flu vaccine: patient states she received during pregnancy  4) breast /Contraception = undecided ("I don't want anything right now")  5) Disposition: home tomorrow  Thomasene MohairStephen Wednesday Ericsson, MD 10/23/2017 11:49 AM

## 2017-10-23 NOTE — Lactation Note (Signed)
This note was copied from a baby's chart. Lactation Consultation Note  Patient Name: Susan Concha NorwayHailey Myers ZOXWR'UToday's Date: 10/23/2017 Reason for consult: Initial assessment;Primapara   Maternal Data Formula Feeding for Exclusion: No Has patient been taught Hand Expression?: Yes Does the patient have breastfeeding experience prior to this delivery?: No  Feeding Feeding Type: Breast Fed Length of feed: 45 min(both breasts per mom)  LATCH Score Latch: Grasps breast easily, tongue down, lips flanged, rhythmical sucking.  Audible Swallowing: Spontaneous and intermittent  Type of Nipple: Everted at rest and after stimulation  Comfort (Breast/Nipple): Filling, red/small blisters or bruises, mild/mod discomfort  Hold (Positioning): No assistance needed to correctly position infant at breast.  LATCH Score: 9  Interventions Interventions: Assisted with latch;Hand express;Adjust position;Support pillows;Coconut oil  Lactation Tools Discussed/Used WIC Program: Yes   Consult Status Consult Status: PRN    Dyann KiefMarsha D Kemontae Dunklee 10/23/2017, 5:52 PM

## 2017-10-23 NOTE — Anesthesia Postprocedure Evaluation (Signed)
Anesthesia Post Note  Patient: Susan Klein  Procedure(s) Performed: AN AD HOC LABOR EPIDURAL  Patient location during evaluation: Mother Baby Anesthesia Type: Epidural Level of consciousness: awake, awake and alert and oriented Pain management: pain level controlled Vital Signs Assessment: post-procedure vital signs reviewed and stable Respiratory status: spontaneous breathing Cardiovascular status: blood pressure returned to baseline Postop Assessment: no headache, no backache, no apparent nausea or vomiting and adequate PO intake Anesthetic complications: no     Last Vitals:  Vitals:   10/23/17 0109 10/23/17 0433  BP: 119/61 117/65  Pulse: 98 88  Resp: 17 17  Temp: 36.7 C 36.9 C  SpO2: 99% 100%    Last Pain:  Vitals:   10/23/17 0433  TempSrc: Oral  PainSc:                  Susan Caldwelleana Brenlee Klein

## 2017-10-24 NOTE — Progress Notes (Signed)
Discharge instructions given. Patient verbalizes understanding of teaching. Patient discharged home at 1350.

## 2017-10-24 NOTE — Final Progress Note (Signed)
Admit Date: 10/21/2017 Today's Date: 10/24/2017  Post Partum Day 2  Subjective:  no complaints, up ad lib, voiding, tolerating PO and + flatus  Objective: Temp:  [97.4 F (36.3 C)-98.6 F (37 C)] 97.6 F (36.4 C) (01/05 0910) Pulse Rate:  [79-91] 91 (01/05 0910) Resp:  [18] 18 (01/05 0349) BP: (136-140)/(76-88) 138/84 (01/05 0910) SpO2:  [99 %-100 %] 99 % (01/05 0910)  Physical Exam:  General: alert, cooperative, appears stated age and no distress Lochia: appropriate Uterine Fundus: firm Incision: none DVT Evaluation: No evidence of DVT seen on physical exam.  Recent Labs    10/21/17 1809 10/23/17 0524  HGB 11.5* 9.6*  HCT 34.7* 30.2*    Assessment/Plan: Discharge home, Breastfeeding and Infant doing well  TdaP received today Rubella and Varicella Immune Rh positive. Declines contraception at this time. Discussed iron rich diet. Patient has iron supplements at home which she can start taking.    LOS: 3 days   [facility] Klein [facility] 10/24/2017, 1:22 PM

## 2017-10-24 NOTE — Progress Notes (Signed)
Patient repeated educated on importance of safe sleeping.  Each time RN entered room mom was sleeping with baby in bed with her.  Baby was taken, swaddled and put into open crib.  Mom stated that baby screams if she is not being held.

## 2017-10-29 ENCOUNTER — Encounter: Payer: Self-pay | Admitting: Advanced Practice Midwife

## 2017-10-29 ENCOUNTER — Ambulatory Visit (INDEPENDENT_AMBULATORY_CARE_PROVIDER_SITE_OTHER): Payer: Medicaid Other | Admitting: Advanced Practice Midwife

## 2017-10-29 VITALS — BP 140/80 | HR 99 | Ht 63.0 in | Wt 170.0 lb

## 2017-10-29 DIAGNOSIS — Z0130 Encounter for examination of blood pressure without abnormal findings: Secondary | ICD-10-CM

## 2017-10-29 NOTE — Progress Notes (Signed)
S: The patient is in the office today for a postpartum blood pressure check. She was induced at 39 weeks for worsening blood pressures/chronic hypertension. She has never taken antihypertensive medication. She has been feeling well since her delivery on 10/22/2017. She denies headache, visual changes or epigastric pain. She is breastfeeding and says that is going well. Her baby Victory DakinRiley is now back to birth weight. Discussion of first line treatment for hypertension- healthy lifestyle diet, exercise and add medications for uncontrolled high BP.   O: BP 140/80   Pulse 99   Ht 5\' 3"  (1.6 m)   Wt 170 lb (77.1 kg)   Breastfeeding? Yes   BMI 30.11 kg/m  Repeat BP is 136/78 Constitutional: Well nourished, well developed female in no acute distress.  HEENT: normal Skin: Warm and dry.  Cardiovascular: Regular rate and rhythm.   Psych: Alert and Oriented x3. No memory deficits. Normal mood and affect.   A: 22 yo female day 7 postpartum chronic hypertension in office for BP check  P: Normo tensive to mild range elevated blood pressure No medication Rx at this time Increase healthy lifestyle diet, exercise Continue breastfeeding Return to clinic for 6 week postpartum visit  Tresea MallJane Jamilah Jean, CNM

## 2017-12-03 ENCOUNTER — Encounter: Payer: Self-pay | Admitting: Certified Nurse Midwife

## 2017-12-03 ENCOUNTER — Ambulatory Visit (INDEPENDENT_AMBULATORY_CARE_PROVIDER_SITE_OTHER): Payer: Medicaid Other | Admitting: Certified Nurse Midwife

## 2017-12-03 DIAGNOSIS — Z3049 Encounter for surveillance of other contraceptives: Secondary | ICD-10-CM | POA: Diagnosis not present

## 2017-12-03 DIAGNOSIS — Z30430 Encounter for insertion of intrauterine contraceptive device: Secondary | ICD-10-CM | POA: Diagnosis not present

## 2017-12-03 DIAGNOSIS — F419 Anxiety disorder, unspecified: Secondary | ICD-10-CM

## 2017-12-03 DIAGNOSIS — F329 Major depressive disorder, single episode, unspecified: Secondary | ICD-10-CM

## 2017-12-03 DIAGNOSIS — F32A Depression, unspecified: Secondary | ICD-10-CM

## 2017-12-03 DIAGNOSIS — Z30014 Encounter for initial prescription of intrauterine contraceptive device: Secondary | ICD-10-CM
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
## 2017-12-12 ENCOUNTER — Encounter: Payer: Self-pay | Admitting: Certified Nurse Midwife

## 2017-12-25 ENCOUNTER — Telehealth: Payer: Self-pay

## 2017-12-25 NOTE — Telephone Encounter (Signed)
Pt had IUD placed 3 wks ago. States she was told she would spot for a day. Pt states she has been bleeding for 3 weeks. Cb#[phone]

## 2017-12-25 NOTE — Telephone Encounter (Signed)
Spoke w/pt. She is using a panty liner. Only a few days she has had to use a tampon. Notified normal to have irregular spotting, d/c, bleeding for up to 3 mos after placement. Pt can discuss further at f/u apt w/[facility] on 01/01/18 or contact us sooner if s&s worsen.

## 2017-12-25 NOTE — Telephone Encounter (Signed)
Pt returning call
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
## 2018-01-01 ENCOUNTER — Encounter: Payer: Self-pay | Admitting: Certified Nurse Midwife

## 2018-01-01 ENCOUNTER — Ambulatory Visit (INDEPENDENT_AMBULATORY_CARE_PROVIDER_SITE_OTHER): Payer: Medicaid Other | Admitting: Certified Nurse Midwife

## 2018-01-01 VITALS — BP 118/76 | HR 66 | Ht 63.0 in | Wt 164.0 lb

## 2018-01-01 DIAGNOSIS — Z975 Presence of (intrauterine) contraceptive device: Secondary | ICD-10-CM

## 2018-01-01 DIAGNOSIS — Z30431 Encounter for routine checking of intrauterine contraceptive device: Secondary | ICD-10-CM | POA: Diagnosis not present
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
## 2018-11-25 ENCOUNTER — Encounter: Payer: Self-pay | Admitting: Family Medicine

## 2018-11-25 ENCOUNTER — Ambulatory Visit (INDEPENDENT_AMBULATORY_CARE_PROVIDER_SITE_OTHER): Payer: Medicaid Other | Admitting: Family Medicine

## 2018-11-25 ENCOUNTER — Other Ambulatory Visit (HOSPITAL_COMMUNITY)
Admission: RE | Admit: 2018-11-25 | Discharge: 2018-11-25 | Disposition: A | Discharge status: Home / Self Care | Payer: Medicaid Other | Source: Ambulatory Visit | Attending: Family Medicine | Admitting: Family Medicine

## 2018-11-25 VITALS — BP 132/86 | HR 97 | Temp 98.7°F | Resp 16 | Ht 64.0 in | Wt 177.4 lb

## 2018-11-25 DIAGNOSIS — Z113 Encounter for screening for infections with a predominantly sexual mode of transmission: Secondary | ICD-10-CM | POA: Diagnosis not present

## 2018-11-25 DIAGNOSIS — E6609 Other obesity due to excess calories: Secondary | ICD-10-CM

## 2018-11-25 DIAGNOSIS — J358 Other chronic diseases of tonsils and adenoids: Secondary | ICD-10-CM

## 2018-11-25 DIAGNOSIS — Z6830 Body mass index (BMI) 30.0-30.9, adult: Secondary | ICD-10-CM

## 2018-11-25 DIAGNOSIS — K581 Irritable bowel syndrome with constipation: Secondary | ICD-10-CM

## 2018-11-25 DIAGNOSIS — F331 Major depressive disorder, recurrent, moderate: Secondary | ICD-10-CM | POA: Diagnosis not present

## 2018-11-25 MED ORDER — LINACLOTIDE 290 MCG PO CAPS: 290.0000 ug | ORAL_CAPSULE | Freq: Every day | ORAL | 1 refills | Status: DC

## 2018-11-25 MED ORDER — SERTRALINE HCL 25 MG PO TABS
ORAL_TABLET | ORAL | 1 refills | Status: DC
Start: 2018-11-25 — End: 2019-01-06
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
## 2018-11-25 NOTE — Patient Instructions (Signed)

## 2018-11-27 LAB — CERVICOVAGINAL ANCILLARY ONLY
Chlamydia: NEGATIVE
Neisseria Gonorrhea: NEGATIVE
Trichomonas: NEGATIVE

## 2018-12-16 ENCOUNTER — Other Ambulatory Visit (HOSPITAL_COMMUNITY)
Admission: RE | Admit: 2018-12-16 | Discharge: 2018-12-16 | Disposition: A | Discharge status: Home / Self Care | Payer: Medicaid Other | Source: Ambulatory Visit | Attending: Certified Nurse Midwife | Admitting: Certified Nurse Midwife

## 2018-12-16 ENCOUNTER — Ambulatory Visit (INDEPENDENT_AMBULATORY_CARE_PROVIDER_SITE_OTHER): Payer: Medicaid Other | Admitting: Certified Nurse Midwife

## 2018-12-16 ENCOUNTER — Encounter: Payer: Self-pay | Admitting: Certified Nurse Midwife

## 2018-12-16 VITALS — BP 132/70 | HR 111 | Ht 63.0 in | Wt 176.0 lb

## 2018-12-16 DIAGNOSIS — N941 Unspecified dyspareunia: Secondary | ICD-10-CM

## 2018-12-16 DIAGNOSIS — N898 Other specified noninflammatory disorders of vagina: Secondary | ICD-10-CM

## 2018-12-16 DIAGNOSIS — Z0000 Encounter for general adult medical examination without abnormal findings: Secondary | ICD-10-CM

## 2018-12-16 DIAGNOSIS — Z113 Encounter for screening for infections with a predominantly sexual mode of transmission: Secondary | ICD-10-CM | POA: Diagnosis present

## 2018-12-16 DIAGNOSIS — R1031 Right lower quadrant pain: Secondary | ICD-10-CM

## 2018-12-16 DIAGNOSIS — Z01419 Encounter for gynecological examination (general) (routine) without abnormal findings: Secondary | ICD-10-CM

## 2018-12-16 LAB — POCT WET PREP (WET MOUNT): Trichomonas Wet Prep HPF POC: ABSENT
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
## 2018-12-20 ENCOUNTER — Encounter: Payer: Self-pay | Admitting: *Deleted

## 2018-12-20 ENCOUNTER — Other Ambulatory Visit: Payer: Self-pay

## 2018-12-21 LAB — CERVICOVAGINAL ANCILLARY ONLY
Chlamydia: NEGATIVE
Neisseria Gonorrhea: NEGATIVE

## 2018-12-23 ENCOUNTER — Ambulatory Visit (INDEPENDENT_AMBULATORY_CARE_PROVIDER_SITE_OTHER): Payer: Medicaid Other

## 2018-12-23 ENCOUNTER — Ambulatory Visit (INDEPENDENT_AMBULATORY_CARE_PROVIDER_SITE_OTHER): Payer: Medicaid Other | Admitting: Certified Nurse Midwife

## 2018-12-23 ENCOUNTER — Encounter: Payer: Self-pay | Admitting: Certified Nurse Midwife

## 2018-12-23 VITALS — BP 112/62 | HR 100 | Ht 63.0 in | Wt 173.0 lb

## 2018-12-23 DIAGNOSIS — N941 Unspecified dyspareunia: Secondary | ICD-10-CM

## 2018-12-23 DIAGNOSIS — N898 Other specified noninflammatory disorders of vagina: Secondary | ICD-10-CM | POA: Diagnosis not present

## 2018-12-23 DIAGNOSIS — R1031 Right lower quadrant pain: Secondary | ICD-10-CM

## 2018-12-23 HISTORY — DX: Unspecified dyspareunia: N94.10
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
## 2018-12-28 ENCOUNTER — Ambulatory Visit
Admission: RE | Admit: 2018-12-28 | Discharge: 2018-12-28 | Disposition: A | Discharge status: Home / Self Care | Payer: Medicaid Other | Attending: Otolaryngology | Admitting: Otolaryngology

## 2018-12-28 ENCOUNTER — Encounter
Admission: RE | Disposition: A | Discharge status: Home / Self Care | Payer: Self-pay | Source: Home / Self Care | Attending: Otolaryngology

## 2018-12-28 ENCOUNTER — Ambulatory Visit: Payer: Medicaid Other | Admitting: Anesthesiology

## 2018-12-28 DIAGNOSIS — R599 Enlarged lymph nodes, unspecified: Secondary | ICD-10-CM | POA: Insufficient documentation

## 2018-12-28 DIAGNOSIS — J3501 Chronic tonsillitis: Secondary | ICD-10-CM | POA: Insufficient documentation

## 2018-12-28 HISTORY — DX: Encounter for fitting and adjustment of orthodontic device: Z46.4

## 2018-12-28 HISTORY — DX: Reserved for inherently not codable concepts without codable children: IMO0001

## 2018-12-28 HISTORY — DX: Scoliosis, unspecified: M41.9

## 2018-12-28 HISTORY — PX: TONSILLECTOMY AND ADENOIDECTOMY: SHX28

## 2018-12-28 LAB — POCT PREGNANCY, URINE: Preg Test, Ur: NEGATIVE

## 2018-12-28 SURGERY — TONSILLECTOMY AND ADENOIDECTOMY
Anesthesia: General | Site: Throat | Laterality: Bilateral

## 2018-12-28 MED ORDER — DEXAMETHASONE SODIUM PHOSPHATE 4 MG/ML IJ SOLN
INTRAMUSCULAR | Status: DC | PRN
  Administered 2018-12-28: 10 mg via INTRAVENOUS

## 2018-12-28 MED ORDER — OXYCODONE HCL 5 MG PO TABS
5.0000 mg | ORAL_TABLET | Freq: Once | ORAL | Status: AC | PRN
  Administered 2018-12-28: 5 mg via ORAL

## 2018-12-28 MED ORDER — GLYCOPYRROLATE 0.2 MG/ML IJ SOLN
INTRAMUSCULAR | Status: DC | PRN
  Administered 2018-12-28: .1 mg via INTRAVENOUS

## 2018-12-28 MED ORDER — SCOPOLAMINE 1 MG/3DAYS TD PT72
1.0000 | MEDICATED_PATCH | TRANSDERMAL | Status: DC
  Administered 2018-12-28: 1.5 mg via TRANSDERMAL

## 2018-12-28 MED ORDER — HYDROCODONE-ACETAMINOPHEN 7.5-325 MG/15ML PO SOLN: ORAL | 0 refills | Status: DC

## 2018-12-28 MED ORDER — PREDNISOLONE SODIUM PHOSPHATE 15 MG/5ML PO SOLN: ORAL | 0 refills | Status: DC

## 2018-12-28 MED ORDER — ACETAMINOPHEN 10 MG/ML IV SOLN
1000.0000 mg | Freq: Once | INTRAVENOUS | Status: AC
  Administered 2018-12-28: 1000 mg via INTRAVENOUS

## 2018-12-28 MED ORDER — ONDANSETRON HCL 4 MG/2ML IJ SOLN
INTRAMUSCULAR | Status: DC | PRN
  Administered 2018-12-28: 4 mg via INTRAVENOUS

## 2018-12-28 MED ORDER — LACTATED RINGERS IV SOLN
INTRAVENOUS | Status: DC
  Administered 2018-12-28: 07:00:00 via INTRAVENOUS

## 2018-12-28 MED ORDER — OXYCODONE HCL 5 MG/5ML PO SOLN: 5.0000 mg | Freq: Once | ORAL | Status: AC | PRN

## 2018-12-28 MED ORDER — PROPOFOL 10 MG/ML IV BOLUS
INTRAVENOUS | Status: DC | PRN
  Administered 2018-12-28: 150 mg via INTRAVENOUS
  Administered 2018-12-28: 50 mg via INTRAVENOUS

## 2018-12-28 MED ORDER — MIDAZOLAM HCL 5 MG/5ML IJ SOLN
INTRAMUSCULAR | Status: DC | PRN
  Administered 2018-12-28: 2 mg via INTRAVENOUS

## 2018-12-28 MED ORDER — SUCCINYLCHOLINE CHLORIDE 20 MG/ML IJ SOLN
INTRAMUSCULAR | Status: DC | PRN
  Administered 2018-12-28: 100 mg via INTRAVENOUS

## 2018-12-28 MED ORDER — BUPIVACAINE HCL (PF) 0.25 % IJ SOLN
INTRAMUSCULAR | Status: DC | PRN
  Administered 2018-12-28: 3.5 mL

## 2018-12-28 MED ORDER — ONDANSETRON HCL 4 MG/2ML IJ SOLN
4.0000 mg | Freq: Once | INTRAMUSCULAR | Status: AC | PRN
  Administered 2018-12-28: 4 mg via INTRAVENOUS

## 2018-12-28 MED ORDER — FENTANYL CITRATE (PF) 100 MCG/2ML IJ SOLN: 25.0000 ug | INTRAMUSCULAR | Status: DC | PRN

## 2018-12-28 MED ORDER — LIDOCAINE HCL (CARDIAC) PF 100 MG/5ML IV SOSY
PREFILLED_SYRINGE | INTRAVENOUS | Status: DC | PRN
  Administered 2018-12-28: 40 mg via INTRAVENOUS

## 2018-12-28 MED ORDER — FENTANYL CITRATE (PF) 100 MCG/2ML IJ SOLN
25.0000 ug | INTRAMUSCULAR | Status: DC | PRN
  Administered 2018-12-28: 100 ug via INTRAVENOUS

## 2018-12-28 MED ORDER — OXYMETAZOLINE HCL 0.05 % NA SOLN
NASAL | Status: DC | PRN
  Administered 2018-12-28: 1 via TOPICAL

## 2018-12-28 SURGICAL SUPPLY — 14 items
BLADE BOVIE TIP EXT 4 (BLADE) ×2 IMPLANT
CANISTER SUCT 1200ML W/VALVE (MISCELLANEOUS) ×2 IMPLANT
CATH ROBINSON RED A/P 10FR (CATHETERS) ×2 IMPLANT
COAG SUCT 10F 3.5MM HAND CTRL (MISCELLANEOUS) ×2 IMPLANT
ELECT REM PT RETURN 9FT ADLT (ELECTROSURGICAL) ×2
ELECTRODE REM PT RTRN 9FT ADLT (ELECTROSURGICAL) ×1 IMPLANT
GLOVE BIO SURGEON STRL SZ7.5 (GLOVE) ×2 IMPLANT
KIT TURNOVER KIT A (KITS) ×2 IMPLANT
NS IRRIG 500ML POUR BTL (IV SOLUTION) ×2 IMPLANT
PACK TONSIL AND ADENOID CUSTOM (PACKS) ×2 IMPLANT
PENCIL SMOKE EVACUATOR (MISCELLANEOUS) ×2 IMPLANT
SLEEVE SUCTION 125 (MISCELLANEOUS) ×2 IMPLANT
SOL ANTI-FOG 6CC FOG-OUT (MISCELLANEOUS) ×1 IMPLANT
SOL FOG-OUT ANTI-FOG 6CC (MISCELLANEOUS) ×1

## 2018-12-28 NOTE — Anesthesia Procedure Notes (Signed)
Procedure Name: Intubation Date/Time: 12/28/2018 8:04 AM Performed by: Jimmy Picket, CRNA Pre-anesthesia Checklist: Patient identified, Emergency Drugs available, Suction available, Patient being monitored and Timeout performed Patient Re-evaluated:Patient Re-evaluated prior to induction Oxygen Delivery Method: Circle system utilized Preoxygenation: Pre-oxygenation with 100% oxygen Induction Type: IV induction Ventilation: Mask ventilation without difficulty Laryngoscope Size: Miller and 2 Grade View: Grade I Tube type: Oral Rae Tube size: 7.0 mm Number of attempts: 1 Placement Confirmation: ETT inserted through vocal cords under direct vision,  positive ETCO2 and breath sounds checked- equal and bilateral Tube secured with: Tape Dental Injury: Teeth and Oropharynx as per pre-operative assessment

## 2018-12-28 NOTE — H&P (Signed)
History and physical reviewed and will be scanned in later. No change in medical status reported by the patient or family, appears stable for surgery. All questions regarding the procedure answered, and patient (or family if a child) expressed understanding of the procedure. ? ?Susan Klein S Susan Klein ?@TODAY@ ?

## 2018-12-28 NOTE — Anesthesia Postprocedure Evaluation (Signed)
Anesthesia Post Note  Patient: Susan Klein  Procedure(s) Performed: TONSILLECTOMY (Bilateral Throat)  Patient location during evaluation: PACU Anesthesia Type: General Level of consciousness: awake and alert Pain management: pain level controlled Vital Signs Assessment: post-procedure vital signs reviewed and stable Respiratory status: spontaneous breathing, nonlabored ventilation, respiratory function stable and patient connected to nasal cannula oxygen Cardiovascular status: blood pressure returned to baseline and stable Postop Assessment: no apparent nausea or vomiting Anesthetic complications: no    Scarlette Slice

## 2018-12-28 NOTE — Transfer of Care (Signed)
Immediate Anesthesia Transfer of Care Note  Patient: Susan Klein  Procedure(s) Performed: TONSILLECTOMY (Bilateral Throat)  Patient Location: PACU  Anesthesia Type: General  Level of Consciousness: awake, alert  and patient cooperative  Airway and Oxygen Therapy: Patient Spontanous Breathing and Patient connected to supplemental oxygen  Post-op Assessment: Post-op Vital signs reviewed, Patient's Cardiovascular Status Stable, Respiratory Function Stable, Patent Airway and No signs of Nausea or vomiting  Post-op Vital Signs: Reviewed and stable  Complications: No apparent anesthesia complications

## 2018-12-28 NOTE — Op Note (Signed)
12/28/2018  8:29 AM    Susan Klein  [id number]   Pre-Op Diagnosis:  Tonsillolithiasis, Chronic Tonsillitis  Post-op Diagnosis: Tonsillolithiasis, Chronic Tonsillitis  Procedure: Tonsillectomy  Surgeon:  Sandi Mealy., MD  Anesthesia:  General endotracheal  EBL:  Less than 25 cc  Complications:  None  Findings: 2+ cryptic tonsils. No significant adenoid tissue  Procedure: The patient was taken to the Operating Room and placed in the supine position.  After induction of general endotracheal anesthesia, the table was turned 90 degrees and the patient was draped in the usual fashion  with the eyes protected.  A mouth gag was inserted into the oral cavity to open the mouth, and examination of the oropharynx showed the uvula was non-bifid. The palate was palpated, and there was no evidence of submucous cleft. Examination of the nasopharynx showed no obstructing adenoids. The right tonsil was grasped with an Allis clamp and resected from the tonsillar fossa in the usual fashion with the Bovie. The left tonsil was resected in the same fashion. The Bovie was used to obtain hemostasis. Each tonsillar fossa was then carefully injected with 0.25% marcaine , avoiding intravascular injection. The nose and throat were irrigated and suctioned to remove any  blood clot. The mouth gag was  removed with no evidence of active bleeding.  The patient was then returned to the anesthesiologist for awakening, and was taken to the Recovery Room in stable condition.  Cultures:  None.  Specimens:  Tonsils.  Disposition:   PACU to home  Plan: Soft, bland diet and push fluids. Take pain medications and steroids as prescribed. No strenuous activity for 2 weeks. Follow-up in 3 weeks.  Sandi Mealy 12/28/2018 8:29 AM

## 2018-12-28 NOTE — Anesthesia Preprocedure Evaluation (Signed)
Anesthesia Evaluation  Patient identified by MRN, date of birth, ID band Patient awake    Reviewed: Allergy & Precautions, H&P , NPO status , Patient's Chart, lab work & pertinent test results, reviewed documented beta blocker date and time   Airway Mallampati: II  TM Distance: >3 FB Neck ROM: full    Dental no notable dental hx.    Pulmonary neg pulmonary ROS, former smoker,    Pulmonary exam normal breath sounds clear to auscultation       Cardiovascular Exercise Tolerance: Good hypertension,  Rhythm:regular Rate:Normal     Neuro/Psych negative neurological ROS  negative psych ROS   GI/Hepatic Neg liver ROS, GERD  ,  Endo/Other  negative endocrine ROS  Renal/GU negative Renal ROS  negative genitourinary   Musculoskeletal   Abdominal   Peds  Hematology negative hematology ROS (+)   Anesthesia Other Findings   Reproductive/Obstetrics negative OB ROS                             Anesthesia Physical Anesthesia Plan  ASA: II  Anesthesia Plan: General   Post-op Pain Management:    Induction:   PONV Risk Score and Plan:   Airway Management Planned:   Additional Equipment:   Intra-op Plan:   Post-operative Plan:   Informed Consent: I have reviewed the patients History and Physical, chart, labs and discussed the procedure including the risks, benefits and alternatives for the proposed anesthesia with the patient or authorized representative who has indicated his/her understanding and acceptance.     Dental Advisory Given  Plan Discussed with: CRNA  Anesthesia Plan Comments:         Anesthesia Quick Evaluation

## 2018-12-28 NOTE — Discharge Instructions (Signed)
T & A INSTRUCTION SHEET - [facility]  Bud Face, MD Cammy Copa, MD  P. SCOTT BENNETT Linus Salmons, MD  [address] [place], [place] [address] TEL. [phone] [address] [place] [address] [phone]  INFORMATION SHEET FOR A TONSILLECTOMY AND ADENDOIDECTOMY  About Your Tonsils and Adenoids  The tonsils and adenoids are normal body tissues that are part of our immune system.  They normally help to protect [place] against diseases that may enter our mouth and nose.  However, sometimes the tonsils and/or adenoids become too large and obstruct our breathing, especially at night.    If either of these things happen it helps to remove the tonsils and adenoids in order to become healthier. The operation to remove the tonsils and adenoids is called a tonsillectomy and adenoidectomy.  The Location of Your Tonsils and Adenoids  The tonsils are located in the back of the throat on both side and sit in a cradle of muscles. The adenoids are located in the roof of the mouth, behind the nose, and closely associated with the opening of the Eustachian tube to the ear.  Surgery on Tonsils and Adenoids  A tonsillectomy and adenoidectomy is a short operation which takes about thirty minutes.  This includes being put to sleep and being awakened.  Tonsillectomies and adenoidectomies are performed at [facility] and may require observation period in the recovery room prior to going home.  Following the Operation for a Tonsillectomy  A cautery machine is used to control bleeding.  Bleeding from a tonsillectomy and adenoidectomy is minimal and postoperatively the risk of bleeding is approximately four percent, although this rarely life threatening.    After your tonsillectomy and adenoidectomy post-op care at home: SLEEP WITH HEAD ELEVATED  1. Our patients are able to go home the same day.  You may be given  prescriptions for pain medications and antibiotics, if indicated. 2. It is extremely important to remember that fluid intake is of utmost importance after a tonsillectomy.  The amount that you drink must be maintained in the postoperative period.  A good indication of whether a child is getting enough fluid is whether his/her urine output is constant.  As long as children are urinating or wetting their diaper every 6 - 8 hours this is usually enough fluid intake.   3. Although rare, this is a risk of some bleeding in the first ten days after surgery.  This is usually occurs between day five and nine postoperatively.  This risk of bleeding is approximately four percent.  If you or your child should have any bleeding you should remain calm and notify our office or go directly to the Emergency Room at [facility] where they will contact us. Our doctors are available seven days a week for notification.  We recommend sitting up quietly in a chair, place an ice pack on the front of the neck and spitting out the blood gently until we are able to contact you.  Adults should gargle gently with ice water and this may help stop the bleeding.  If the bleeding does not stop after a short time, i.e. 10 to 15 minutes, or seems to be increasing again, please contact us or go to the hospital.   4. It is common for the pain to be worse at 5 - 7 days postoperatively.  This occurs because the scab is peeling off and the  mucous membrane (skin of the throat) is growing back where the tonsils were.   5. It is common for a low-grade fever, less than 102, during the first week after a tonsillectomy and adenoidectomy.  It is usually due to not drinking enough liquids, and we suggest your use liquid Tylenol or the pain medicine with Tylenol prescribed in order to keep your temperature below 102.  Please follow the directions on the back of the bottle. 6. Do not take aspirin or any products that contain aspirin such  as Bufferin, Anacin, Ecotrin, aspirin gum, Goodies, BC headache powders, etc., after a T&A because it can promote bleeding.  Please check with our office before administering any other medication that may been prescribed by other doctors during the two week post-operative period. 7. If you happen to look in the mirror or into your childs mouth you will see white/gray patches on the back of the throat.  This is what a scab looks like in the mouth and is normal after having a T&A.  It will disappear once the tonsil area heals completely. However, it may cause a noticeable odor, and this too will disappear with time.     8. You or your child may experience ear pain after having a T&A.  This is called referred pain and comes from the throat, but it is felt in the ears.  Ear pain is quite common and expected.  It will usually go away after ten days.  There is usually nothing wrong with the ears, and it is primarily due to the healing area stimulating the nerve to the ear that runs along the side of the throat.  Use either the prescribed pain medicine or Tylenol as needed.  9. The throat tissues after a tonsillectomy are obviously sensitive.  Smoking around children who have had a tonsillectomy significantly increases the risk of bleeding.  DO NOT SMOKE!    General Anesthesia, Adult, Care After This sheet gives you information about how to care for yourself after your procedure. Your health care provider may also give you more specific instructions. If you have problems or questions, contact your health care provider. What can I expect after the procedure? After the procedure, the following side effects are common:  Pain or discomfort at the IV site.  Nausea.  Vomiting.  Sore throat.  Trouble concentrating.  Feeling cold or chills.  Weak or tired.  Sleepiness and fatigue.  Soreness and body aches. These side effects can affect parts of the body that were not involved in surgery. Follow these  instructions at home:  For at least 24 hours after the procedure:  Have a responsible adult stay with you. It is important to have someone help care for you until you are awake and alert.  Rest as needed.  Do not: ? Participate in activities in which you could fall or become injured. ? Drive. ? Use heavy machinery. ? Drink alcohol. ? Take sleeping pills or medicines that cause drowsiness. ? Make important decisions or sign legal documents. ? Take care of children on your own. Eating and drinking  Follow any instructions from your health care provider about eating or drinking restrictions.  When you feel hungry, start by eating small amounts of foods that are soft and easy to digest (bland), such as toast. Gradually return to your regular diet.  Drink enough fluid to keep your urine pale yellow.  If you vomit, rehydrate by drinking water, juice, or clear broth. General instructions  If you have sleep apnea, surgery and certain medicines can increase your risk for breathing problems. Follow instructions from your health care provider about wearing your sleep device: ? Anytime you are sleeping, including during daytime naps. ? While taking prescription pain medicines, sleeping medicines, or medicines that make you drowsy.  Return to your normal activities as told by your health care provider. Ask your health care provider what activities are safe for you.  Take over-the-counter and prescription medicines only as told by your health care provider.  If you smoke, do not smoke without supervision.  Keep all follow-up visits as told by your health care provider. This is important. Contact a health care provider if:  You have nausea or vomiting that does not get better with medicine.  You cannot eat or drink without vomiting.  You have pain that does not get better with medicine.  You are unable to pass urine.  You develop a skin rash.  You have a fever.  You have redness  around your IV site that gets worse. Get help right away if:  You have difficulty breathing.  You have chest pain.  You have blood in your urine or stool, or you vomit blood. Summary  After the procedure, it is common to have a sore throat or nausea. It is also common to feel tired.  Have a responsible adult stay with you for the first 24 hours after general anesthesia. It is important to have someone help care for you until you are awake and alert.  When you feel hungry, start by eating small amounts of foods that are soft and easy to digest (bland), such as toast. Gradually return to your regular diet.  Drink enough fluid to keep your urine pale yellow.  Return to your normal activities as told by your health care provider. Ask your health care provider what activities are safe for you. This information is not intended to replace advice given to you by your health care provider. Make sure you discuss any questions you have with your health care provider. Document Released: 01/12/2001 Document Revised: 05/22/2017 Document Reviewed: 05/22/2017 Elsevier Interactive Patient Education  2019 [employer].

## 2018-12-29 ENCOUNTER — Encounter: Payer: Self-pay | Admitting: Otolaryngology

## 2018-12-30 LAB — SURGICAL PATHOLOGY

## 2019-01-06 ENCOUNTER — Other Ambulatory Visit: Payer: Self-pay

## 2019-01-06 ENCOUNTER — Ambulatory Visit (INDEPENDENT_AMBULATORY_CARE_PROVIDER_SITE_OTHER): Payer: Medicaid Other | Admitting: Family Medicine

## 2019-01-06 ENCOUNTER — Encounter: Payer: Self-pay | Admitting: Family Medicine

## 2019-01-06 VITALS — BP 120/70 | HR 100 | Temp 98.5°F | Resp 16 | Ht 63.0 in | Wt 170.3 lb

## 2019-01-06 DIAGNOSIS — F331 Major depressive disorder, recurrent, moderate: Secondary | ICD-10-CM | POA: Diagnosis not present

## 2019-01-06 DIAGNOSIS — K581 Irritable bowel syndrome with constipation: Secondary | ICD-10-CM | POA: Diagnosis not present

## 2019-01-06 MED ORDER — SERTRALINE HCL 25 MG PO TABS: 25.0000 mg | ORAL_TABLET | Freq: Every day | ORAL | 1 refills | Status: DC

## 2019-01-06 MED ORDER — PLECANATIDE 3 MG PO TABS: 3.0000 mg | ORAL_TABLET | Freq: Every day | ORAL | 1 refills | Status: DC

## 2019-01-06 NOTE — Patient Instructions (Signed)
Coronavirus (COVID-19) Are you at risk?  Are you at risk for the Coronavirus (COVID-19)?  To be considered HIGH RISK for Coronavirus (COVID-19), you have to meet the following criteria:  . Traveled to [place], [place], [place], [place] or [place]; or in the [place] to [place], [place], [place], or [place]; and have fever, cough, and shortness of breath within the last 2 weeks of travel OR . Been in close contact with a person diagnosed with COVID-19 within the last 2 weeks and have fever, cough, and shortness of breath . IF YOU DO NOT MEET THESE CRITERIA, YOU ARE CONSIDERED LOW RISK FOR COVID-19.  What to do if you are HIGH RISK for COVID-19?  . If you are having a medical emergency, call 911. . Seek medical care right away. Before you go to a doctor's office, urgent care or emergency department, call ahead and tell them about your recent travel, contact with someone diagnosed with COVID-19, and your symptoms. You should receive instructions from your physician's office regarding next steps of care.  . When you arrive at healthcare provider, tell the healthcare staff immediately you have returned from visiting [place], [place], [place], [place] or [place]; or traveled in the [place] to [place], [place], [place], or [place]; in the last two weeks or you have been in close contact with a person diagnosed with COVID-19 in the last 2 weeks.   . Tell the health care staff about your symptoms: fever, cough and shortness of breath. . After you have been seen by a medical provider, you will be either: o Tested for (COVID-19) and discharged home on quarantine except to seek medical care if symptoms worsen, and asked to  - Stay home and avoid contact with others until you get your results (4-5 days)  - Avoid travel on public transportation if possible (such as bus, train, or airplane) or o Sent to the Emergency Department by EMS for evaluation, COVID-19 testing, and possible  admission depending on your condition and test results.  What to do if you are LOW RISK for COVID-19?  Reduce your risk of any infection by using the same precautions used for avoiding the common cold or flu:  . Wash your hands often with soap and warm water for at least 20 seconds.  If soap and water are not readily available, use an alcohol-based hand sanitizer with at least 60% alcohol.  . If coughing or sneezing, cover your mouth and nose by coughing or sneezing into the elbow areas of your shirt or coat, into a tissue or into your sleeve (not your hands). . Avoid shaking hands with others and consider head nods or verbal greetings only. . Avoid touching your eyes, nose, or mouth with unwashed hands.  . Avoid close contact with people who are sick. . Avoid places or events with large numbers of people in one location, like concerts or sporting events. . Carefully consider travel plans you have or are making. . If you are planning any travel outside or inside the [place], visit the CDC's Travelers' Health webpage for the latest health notices. . If you have some symptoms but not all symptoms, continue to monitor at home and seek medical attention if your symptoms worsen. . If you are having a medical emergency, call 911.   ADDITIONAL HEALTHCARE OPTIONS FOR PATIENTS  [facility] / e-Visit: [email or web address]         [facility]: [phone]  [facility]: [phone]                   [facility]: [phone]    Here are some resources to help you if you feel you are in a mental health crisis:  National Suicide Prevention Lifeline - Call 8302480049  for help - Website with more resources: ARanked.fi  [employer] [facility] - Call (508)005-2361 for help. - Mobile Crisis Program available 24 hours a day, 365 days a year. - Available for  anyone of any age in [place] counties.  [facility] - Address: [address], [place] - Telephone: [phone]  - Hours of Operation: Sunday - Saturday - 8:00 a.m. - 8:00 p.m. - Medicaid, Medicare (Government Issued Only), BCBS, and [employer] - Pay - Crisis Management, Outpatient Individual & Group Therapy, Psychiatrists on-site to provide medication management, In-Home Psychiatric Care, and Peer Support Care.  [facility] - Call 9016154518 for help. - Mobile Crisis Program available 24 hours a day, 365 days a year. - Available for anyone of any age in [facility]

## 2019-01-06 NOTE — Progress Notes (Signed)
Name: Susan Klein   MRN: [id number]    DOB: 02-01-1996   Date:01/06/2019       Progress Note  Subjective  Chief Complaint  Chief Complaint  Patient presents with  . Follow-up    6 week F/U  . Irritable Bowel Syndrome    Taking Linzess-her stools are still not solid.  . Depression    Doing well.     HPI  Constipation: she has been on linzess for 6 weeks. She reports about 2-3 episodes of diarrhea a day. Stools are loose and watery. She denies bloody or dark or tary stools. She denies abdominal pain, cramping, nausea or vomiting. We will switch to trulance  Anxiety and Depression: currently on zoloft 25 mg daily in the morning. She reports feeling better overall. Has noticed that she has more patience with her daughter who is a toddler. Feels that her anxiety has improved, states she felt nervous before her recent tonsillectomy but has not had any recent panic attacks. She reports an increase in her appetite and also that she is still having some difficulty concentrating. She has difficulty sleeping but relates it to never feeling very tired.     Office Visit from 01/06/2019 in [facility]  PHQ-9 Total Score  10     Tonsil Stones: Had tonsillectomy done on 12/28/2018 and is doing well. No concerns.  Obesity: Diet contains plenty of greens, home cooked meals, some fruit. She does fry some foods.  Does some walking for exercise and also has a small child that she chases around. States she has cut out sodas and sugary drinks for the most part, she has also decreased her sugar intake.   Patient Active Problem List   Diagnosis Date Noted  . Moderate episode of recurrent major depressive disorder (HCC) 01/06/2019  . Dyspareunia in female 12/23/2018  . Gestational hypertension 10/21/2017  . Chronic hypertension during pregnancy, antepartum 08/06/2017  . 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 NO PAST  SURGERIES    . TONSILLECTOMY AND ADENOIDECTOMY Bilateral 12/28/2018   Procedure: TONSILLECTOMY;  Surgeon: Geanie Logan, MD;  Location: [facility];  Service: ENT;  Laterality: Bilateral;  Faxed over consent, physicians orders, and H&P/klp    Family History  Problem Relation Age of Onset  . Depression Mother   . Heart attack Mother   . Hypertension Mother   . Gestational diabetes Mother   . Heart attack Father   . Lung cancer Maternal Uncle   . Alzheimer's disease Paternal Grandmother     Social History   Socioeconomic History  . Marital status: Significant Other    Spouse name: Not on file  . Number of children: 1  . Years of education: Not on file  . Highest education level: Some college, no degree  Occupational History  . Occupation: stay at home mom  Social Needs  . Financial resource strain: Not hard at all  . Food insecurity:    Worry: Sometimes true    Inability: Sometimes true  . Transportation needs:    Medical: Yes    Non-medical: Yes  Tobacco Use  . Smoking status: Former Games developer  . Smokeless tobacco: Never Used  Substance and Sexual Activity  . Alcohol use: No  . Drug use: No  . Sexual activity: Yes    Partners: Male    Birth control/protection: I.U.D.  Lifestyle  . Physical activity:    Days per  week: 0 days    Minutes per session: 0 min  . Stress: To some extent  Relationships  . Social connections:    Talks on phone: More than three times a week    Gets together: Once a week    Attends religious service: Never    Active member of club or organization: No    Attends meetings of clubs or organizations: Never    Relationship status: Never married  . Intimate partner violence:    Fear of current or ex partner: No    Emotionally abused: No    Physically abused: No    Forced sexual activity: No  Other Topics Concern  . Not on file  Social History Narrative  . Not on file     Current Outpatient Medications:  .  Levonorgestrel  (LILETTA, 52 MG,) 19.5 MCG/DAY IUD IUD, 1 each by Intrauterine route once. , Disp: , Rfl:  .  Plecanatide (TRULANCE) 3 MG TABS, Take 3 mg by mouth daily., Disp: 90 tablet, Rfl: 1 .  sertraline (ZOLOFT) 25 MG tablet, Take 1 tablet (25 mg total) by mouth daily. Take 1/2 tab once daily x7 days, then increase to 1 tab once daily, Disp: 90 tablet, Rfl: 1  Allergies  Allergen Reactions  . Mucinex [Guaifenesin Er] Hives    I personally reviewed active problem list, medication list, allergies with the patient/caregiver today.   Review of Systems  Constitutional: Negative.   HENT: Negative.   Eyes: Negative.   Respiratory: Negative.   Cardiovascular: Negative.   Gastrointestinal: Positive for diarrhea. Negative for abdominal pain, blood in stool, constipation and vomiting.  Genitourinary: Negative.   Musculoskeletal: Negative.   Skin: Negative.   Neurological: Negative.   Endo/Heme/Allergies: Negative.   Psychiatric/Behavioral: Negative for depression and suicidal ideas. The patient is not nervous/anxious.     Objective  Vitals:   01/06/19 1251  BP: 120/70  Pulse: 100  Resp: 16  Temp: 98.5 F (36.9 C)  TempSrc: Oral  SpO2: 99%  Weight: 77.2 kg  Height: 5\' 3"  (1.6 m)    Body mass index is 30.17 kg/m.  Physical Exam Vitals signs and nursing note reviewed.  Constitutional:      Appearance: Normal appearance.  HENT:     Head: Normocephalic and atraumatic.     Right Ear: Tympanic membrane normal.     Left Ear: Tympanic membrane normal.     Nose: Nose normal.     Mouth/Throat:     Mouth: Mucous membranes are moist.  Eyes:     Pupils: Pupils are equal, round, and reactive to light.  Neck:     Musculoskeletal: Normal range of motion and neck supple.  Cardiovascular:     Rate and Rhythm: Normal rate and regular rhythm.     Pulses: Normal pulses.     Heart sounds: Normal heart sounds.  Pulmonary:     Effort: Pulmonary effort is normal.     Breath sounds: Normal breath  sounds.  Abdominal:     General: Abdomen is flat.     Palpations: Abdomen is soft.  Musculoskeletal: Normal range of motion.  Skin:    General: Skin is warm.  Neurological:     General: No focal deficit present.     Mental Status: She is alert and oriented to person, place, and time. Mental status is at baseline.  Psychiatric:        Mood and Affect: Mood normal.        Behavior: Behavior normal.  Thought Content: Thought content normal.        Judgment: Judgment normal.     No results found for this or any previous visit (from the past 72 hour(s)).   PHQ2/9: Depression screen [facility] 2/9 01/06/2019 11/25/2018  Decreased Interest 1 1  Down, Depressed, Hopeless 0 2  PHQ - 2 Score 1 3  Altered sleeping 3 3  Tired, decreased energy 0 3  Change in appetite 3 3  Feeling bad or failure about yourself  0 1  Trouble concentrating 1 1  Moving slowly or fidgety/restless 2 0  Suicidal thoughts 0 0  PHQ-9 Score 10 14  Difficult doing work/chores Very difficult Somewhat difficult   PHQ-2/9 Result is positive.    Fall Risk: Fall Risk  01/06/2019 11/25/2018  Falls in the past year? 1 0  Number falls in past yr: 1 0  Injury with Fall? 1 0  Comment Sprain her left ankle -    Functional Status Survey: Is the patient deaf or have difficulty hearing?: No Does the patient have difficulty seeing, even when wearing glasses/contacts?: No Does the patient have difficulty concentrating, remembering, or making decisions?: Yes Does the patient have difficulty walking or climbing stairs?: No Does the patient have difficulty dressing or bathing?: No Does the patient have difficulty doing errands alone such as visiting a doctor's office or shopping?: No   Assessment & Plan  1. Moderate episode of recurrent major depressive disorder (HCC) - She will message if she'd like to increase to  prior to next follow up, declines today. - sertraline (ZOLOFT) 25 MG tablet; Take 1 tablet (25 mg total)  by mouth daily. Take 1/2 tab once daily x7 days, then increase to 1 tab once daily  Dispense: 90 tablet; Refill: 1  2. Irritable bowel syndrome with constipation - STOP Linzess, trial Trulance - Plecanatide (TRULANCE) 3 MG TABS; Take 3 mg by mouth daily.  Dispense: 90 tablet; Refill: 1

## 2019-01-16 ENCOUNTER — Encounter: Payer: Self-pay | Admitting: Family Medicine

## 2019-01-25 ENCOUNTER — Other Ambulatory Visit: Payer: Self-pay | Admitting: Family Medicine

## 2019-01-25 DIAGNOSIS — K581 Irritable bowel syndrome with constipation: Secondary | ICD-10-CM

## 2019-01-25 MED ORDER — LUBIPROSTONE 8 MCG PO CAPS: 8.0000 ug | ORAL_CAPSULE | Freq: Two times a day (BID) | ORAL | 1 refills | Status: DC

## 2019-01-25 NOTE — Progress Notes (Unsigned)
Received PA denial of Trulance, Linzess caused diarrhea. Please call patient and let her know I'm sending in Amitiza.

## 2019-01-31 NOTE — Progress Notes (Signed)
Left message for patient

## 2019-02-04 ENCOUNTER — Encounter: Payer: Self-pay | Admitting: Family Medicine

## 2019-02-04 DIAGNOSIS — F331 Major depressive disorder, recurrent, moderate: Secondary | ICD-10-CM

## 2019-02-04 MED ORDER — SERTRALINE HCL 25 MG PO TABS: 50.0000 mg | ORAL_TABLET | Freq: Every day | ORAL | 1 refills | Status: DC

## 2019-02-14 ENCOUNTER — Telehealth: Payer: Medicaid Other | Admitting: Physician Assistant

## 2019-02-14 DIAGNOSIS — R112 Nausea with vomiting, unspecified: Secondary | ICD-10-CM

## 2019-02-14 MED ORDER — PROMETHAZINE HCL 25 MG PO TABS: 25.0000 mg | ORAL_TABLET | Freq: Two times a day (BID) | ORAL | 0 refills | Status: DC | PRN

## 2019-02-14 NOTE — Progress Notes (Signed)
We are sorry that you are not feeling well. Here is how we plan to help!  Based on what you have shared with me it looks like you have a Virus that is irritating your GI tract.  Vomiting is the forceful emptying of a portion of the stomach's content through the mouth.  Although nausea and vomiting can make you feel miserable, it's important to remember that these are not diseases, but rather symptoms of an underlying illness.  When we treat short term symptoms, we always caution that any symptoms that persist should be fully evaluated in a medical office.  I have prescribed a medication that will help alleviate your symptoms and allow you to stay hydrated:  Promethazine 25 mg take 1 tablet twice daily  If you think you may be pregnant please contact primary care physician or OBGYN.  Take home pregnancy test if you have missed your period.   HOME CARE:  Drink clear liquids.  This is very important! Dehydration (the lack of fluid) can lead to a serious complication.  Start off with 1 tablespoon every 5 minutes for 8 hours.  You may begin eating bland foods after 8 hours without vomiting.  Start with saltine crackers, white bread, rice, mashed potatoes, applesauce.  After 48 hours on a bland diet, you may resume a normal diet.  Try to go to sleep.  Sleep often empties the stomach and relieves the need to vomit.  GET HELP RIGHT AWAY IF:   Your symptoms do not improve or worsen within 2 days after treatment.  You have a fever for over 3 days.  You cannot keep down fluids after trying the medication.  MAKE SURE YOU:   Understand these instructions.  Will watch your condition.  Will get help right away if you are not doing well or get worse.   Thank you for choosing an e-visit. Your e-visit answers were reviewed by a board certified advanced clinical practitioner to complete your personal care plan. Depending upon the condition, your plan could have included both over the counter or  prescription medications. Please review your pharmacy choice. Be sure that the pharmacy you have chosen is open so that you can pick up your prescription now.  If there is a problem you may message your provider in MyChart to have the prescription routed to another pharmacy. Your safety is important to [place]. If you have drug allergies check your prescription carefully.  For the next 24 hours, you can use MyChart to ask questions about today's visit, request a non-urgent call back, or ask for a work or school excuse from your e-visit provider. You will get an e-mail in the next two days asking about your experience. I hope that your e-visit has been valuable and will speed your recovery.  Approximately 5 minutes spent documenting and reviewing patient's chart.  Jodell Cipro, PA-C

## 2019-02-24 ENCOUNTER — Encounter: Payer: Medicaid Other | Admitting: Family Medicine

## 2019-03-16 ENCOUNTER — Ambulatory Visit: Payer: Medicaid Other | Attending: Certified Nurse Midwife

## 2019-03-16 ENCOUNTER — Other Ambulatory Visit: Payer: Self-pay

## 2019-03-16 DIAGNOSIS — M62838 Other muscle spasm: Secondary | ICD-10-CM | POA: Diagnosis not present

## 2019-03-16 DIAGNOSIS — R293 Abnormal posture: Secondary | ICD-10-CM | POA: Diagnosis not present

## 2019-03-16 DIAGNOSIS — M4124 Other idiopathic scoliosis, thoracic region: Secondary | ICD-10-CM | POA: Diagnosis not present
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
## 2019-03-16 NOTE — Patient Instructions (Signed)
  When seated, you want to maintain pelvic neutral with the shoulders gently down and back and ears in line with your shoulders. A lumbar roll such as the one below or a home-made towel-roll can be used for this purpose. Even Olympic athletes can only maintain proper seated posture for about 10 minutes without support!    Pictured: The Original [employer] Lumbar Roll  

## 2019-03-21 ENCOUNTER — Encounter: Payer: Self-pay | Admitting: Family Medicine

## 2019-03-21 DIAGNOSIS — H5203 Hypermetropia, bilateral: Secondary | ICD-10-CM | POA: Diagnosis not present

## 2019-03-21 MED ORDER — SERTRALINE HCL 50 MG PO TABS: 50.0000 mg | ORAL_TABLET | Freq: Every day | ORAL | 0 refills | Status: DC

## 2019-03-23 ENCOUNTER — Other Ambulatory Visit: Payer: Self-pay

## 2019-03-23 ENCOUNTER — Ambulatory Visit: Payer: Medicaid Other | Attending: Certified Nurse Midwife

## 2019-03-23 ENCOUNTER — Encounter: Payer: Self-pay | Admitting: Family Medicine

## 2019-03-23 DIAGNOSIS — M62838 Other muscle spasm: Secondary | ICD-10-CM | POA: Diagnosis not present

## 2019-03-23 DIAGNOSIS — R293 Abnormal posture: Secondary | ICD-10-CM | POA: Diagnosis not present

## 2019-03-23 DIAGNOSIS — M4124 Other idiopathic scoliosis, thoracic region: Secondary | ICD-10-CM | POA: Diagnosis not present
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
## 2019-03-23 NOTE — Patient Instructions (Signed)
  Female version: Dr. Alonna Minium Premium Prostate Massager    *Can find at [facility] in [place] or on [facility]    Self Internal Trigger Point Relief    1) Wash your hands and prop yourself up in a way where you can easily reach the vagina. You may wish to have a small hand-held mirror near by.  2) lubricate the tool and vaginal opening using a hypoallergenic lubricant such as "slippery-stuff".   3) Slowly and gently insert the tool into the vagina using deep breaths to allow relaxation of the muscles around the tool.  4) Avoiding the "12 o-clock" region near the urethra, gently use the handle of the tool like a lever to press the angled tip of the tool onto the wall of the pelvic floor.   5) Move the tool to different areas of the pelvic floor and feel for areas that are tender called "trigger points". When you find one hold the tool still, applying just enough pressure to elicit mild discomfort and take deep belly breaths until the discomfort subsides or decreases by at least 50%.   6) Repeat the process for any trigger points you find spending between 3-10 minutes on this per night until you do not find any more trigger points or you are told otherwise by your therapist..  Do this every-other day until you do not have any more spasms. You can come back and do a re-check at any time in the future if you start having pan with intercourse again.   Shoulder Retraction and Downward Rotation   Rotate the shoulder blades back and down as if you had to hold a pencil between them, holding for 1 full second each time. Repeat this _10x3_ times _1_ times per day.      Start with Shoulder Retraction and Downward Rotation pictures above, holding the position while pulling the chin straight back as if trying to make a "double chin".  Breathe in forward and breathe out as you pull back, repeating this _10x3__ times _1___ times per day.     Flexors, Lunge  Hip Flexor Stretch:  Proposal Pose    Maintain pelvic tuck under, lift pubic bone toward navel. Engage posterior hip muscles (firm glute muscles of leg in back position) and shift forward until you feel stretch on front of leg that is down. To increase stretch, maintain balance and ease hips forward. You may use one hand on a chair for balance if needed. Hold for __5__ breaths. Repeat __2-3__ times each leg.  Do _1-2__ times per day.     Inhale as you come forward, exhale as you draw the imaginary bow and rotate through the torso. You should feel the stretch/discomfort in the back at the end of your rotation/exhale. Do 2x15 on each side.

## 2019-03-28 ENCOUNTER — Encounter: Payer: Self-pay | Admitting: Family Medicine

## 2019-03-28 ENCOUNTER — Ambulatory Visit (INDEPENDENT_AMBULATORY_CARE_PROVIDER_SITE_OTHER): Payer: Medicaid Other | Admitting: Family Medicine

## 2019-03-28 ENCOUNTER — Other Ambulatory Visit: Payer: Self-pay

## 2019-03-28 DIAGNOSIS — R110 Nausea: Secondary | ICD-10-CM | POA: Diagnosis not present

## 2019-03-28 DIAGNOSIS — R1013 Epigastric pain: Secondary | ICD-10-CM

## 2019-03-28 MED ORDER — ONDANSETRON HCL 4 MG PO TABS: 4.0000 mg | ORAL_TABLET | Freq: Three times a day (TID) | ORAL | 0 refills | Status: DC | PRN

## 2019-03-28 NOTE — Progress Notes (Signed)
Name: Susan Klein   MRN: [id number]    DOB: September 16, 1996   Date:03/28/2019       Progress Note  Subjective  Chief Complaint  Chief Complaint  Patient presents with  . Nausea    for 1 month    I connected with  Susan Klein  on 03/28/19 at  2:20 PM EDT by a video enabled telemedicine application and verified that I am speaking with the correct person using two identifiers.  I discussed the limitations of evaluation and management by telemedicine and the availability of in person appointments. The patient expressed understanding and agreed to proceed. Staff also discussed with the patient that there may be a patient responsible charge related to this service. Patient Location: Home Provider Location: Office Additional Individuals present: None  HPI  Pt presents with severe nausea for 1 month.  She did take pregnancy test and it was negative.  Denies vomiting, constipation, diarrhea, fevers/chills, urinary symptoms.  Some heartburn. Nausea is typically on empty stomach and in the afternoon and at night.  She is taking amitiza and is having BM's at least once daily and doing well on this medication.  Patient Active Problem List   Diagnosis Date Noted  . Moderate episode of recurrent major depressive disorder ([place]) 01/06/2019  . Irritable bowel syndrome with constipation 01/06/2019  . Dyspareunia in female 12/23/2018  . 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    Social History   Tobacco Use  . Smoking status: Former Research scientist (life sciences)  . Smokeless tobacco: Never Used  Substance Use Topics  . Alcohol use: No     Current Outpatient Medications:  .  Levonorgestrel (LILETTA, 52 MG,) 19.5 MCG/DAY IUD IUD, 1 each by Intrauterine route once. , Disp: , Rfl:  .  lubiprostone (AMITIZA) 8 MCG capsule, Take 1 capsule (8 mcg total) by mouth 2 (two) times daily with a meal., Disp: 180 capsule, Rfl: 1 .  Plecanatide (TRULANCE) 3 MG TABS, Take 3 mg by mouth daily. (Patient not taking: Reported on  03/16/2019), Disp: 90 tablet, Rfl: 1 .  promethazine (PHENERGAN) 25 MG tablet, Take 1 tablet (25 mg total) by mouth every 12 (twelve) hours as needed for nausea or vomiting. (Patient not taking: Reported on 03/16/2019), Disp: 20 tablet, Rfl: 0 .  sertraline (ZOLOFT) 50 MG tablet, Take 1 tablet (50 mg total) by mouth daily. (Patient not taking: Reported on 03/28/2019), Disp: 90 tablet, Rfl: 0  Allergies  Allergen Reactions  . Mucinex [Guaifenesin Er] Hives    I personally reviewed active problem list, medication list, allergies, notes from last encounter, lab results with the patient/caregiver today.  ROS  Ten systems reviewed and is negative except as mentioned in HPI  Objective  Virtual encounter, vitals not obtained.  There is no height or weight on file to calculate BMI.  Nursing Note and Vital Signs reviewed.  Physical Exam  Constitutional: Patient appears well-developed and well-nourished. No distress.  HENT: Head: Normocephalic and atraumatic.  Neck: Normal range of motion. Pulmonary/Chest: Effort normal. No respiratory distress. Speaking in complete sentences Neurological: Pt is alert and oriented to person, place, and time. Coordination, speech and gait are normal.  Psychiatric: Patient has a normal mood and affect. behavior is normal. Judgment and thought content normal. Abdominal: She self palpates abdomen and notes some tenderness in the central upper abdomen, no tenderness BUQ or BLQ.  No results found for this or any previous visit (from the past 72 hour(s)).  Assessment & Plan  1. Nausea -  Consider GERD treatment after testing if labs are non-contributory. - H. pylori breath test - CBC with Differential/Platelet - COMPLETE METABOLIC PANEL WITH GFR - ondansetron (ZOFRAN) 4 MG tablet; Take 1 tablet (4 mg total) by mouth every 8 (eight) hours as needed for nausea or vomiting.  Dispense: 20 tablet; Refill: 0  2. Epigastric pain - H. pylori breath test - CBC with  Differential/Platelet - COMPLETE METABOLIC PANEL WITH GFR - ondansetron (ZOFRAN) 4 MG tablet; Take 1 tablet (4 mg total) by mouth every 8 (eight) hours as needed for nausea or vomiting.  Dispense: 20 tablet; Refill: 0   -Red flags and when to present for emergency care or RTC including fever >101.75F, chest pain, shortness of breath, new/worsening/un-resolving symptoms, severe abdominal pain reviewed with patient at time of visit. Follow up and care instructions discussed and provided in AVS. - I discussed the assessment and treatment plan with the patient. The patient was provided an opportunity to ask questions and all were answered. The patient agreed with the plan and demonstrated an understanding of the instructions.  I provided 15 minutes of non-face-to-face time during this encounter.  Doren CustardEmily E Boyce, FNP

## 2019-03-29 NOTE — Therapy (Unsigned)
[place] MAIN [facility] SERVICES [address] [place], [place], [address] Phone: [phone]   Fax:  [phone]  March 29, 2019   @[email or web address] @  Physical Therapy Discharge Summary  Patient: Susan Klein  MRN: [id number]  Date of Birth: 07-Nov-1995   Diagnosis: No diagnosis found. Referring Provider (PT): Dalia Heading   The above patient had been seen in Physical Therapy 2 times of 10 treatments scheduled with 0 no shows and 8 cancellations.  The treatment consisted of Education, Manual treatment, and therapeutic exercises The patient is: Unknown due to inability to return for visit for follow-up due to lack of childcare  Subjective: Pt. Unable to return for remaining visits due to lack of child-care and hospital no child policy from [id number].  Discharge Findings: Pt. Responded well to all treatment and education given but we were not able to assess what improvement she has made due to Pt. Having to cancel remaining visits via phone.  Functional Status at Discharge: Independent  Unable to assess    Sincerely,  Susan Klein DPT, ATC Susan Klein, PT   CC [email or web address] @  [place] [address] [place], [place], [address] Phone: [phone]   Fax:  [phone]  Patient: Susan Klein  MRN: [id number]  Date of Birth: Jan 28, 1996

## 2019-03-30 DIAGNOSIS — R110 Nausea: Secondary | ICD-10-CM | POA: Diagnosis not present

## 2019-03-30 DIAGNOSIS — R1013 Epigastric pain: Secondary | ICD-10-CM | POA: Diagnosis not present

## 2019-03-31 DIAGNOSIS — R110 Nausea: Secondary | ICD-10-CM | POA: Diagnosis not present

## 2019-03-31 DIAGNOSIS — R1013 Epigastric pain: Secondary | ICD-10-CM | POA: Diagnosis not present

## 2019-03-31 LAB — CBC WITH DIFFERENTIAL/PLATELET
Absolute Monocytes: 400 cells/uL (ref 200–950)
Basophils Absolute: 30 cells/uL (ref 0–200)
Basophils Relative: 0.4 %
Eosinophils Absolute: 81 cells/uL (ref 15–500)
Eosinophils Relative: 1.1 %
HCT: 41.7 % (ref 35.0–45.0)
Hemoglobin: 14 g/dL (ref 11.7–15.5)
Lymphs Abs: 2324 cells/uL (ref 850–3900)
MCH: 30.5 pg (ref 27.0–33.0)
MCHC: 33.6 g/dL (ref 32.0–36.0)
MCV: 90.8 fL (ref 80.0–100.0)
MPV: 10.2 fL (ref 7.5–12.5)
Monocytes Relative: 5.4 %
Neutro Abs: 4566 cells/uL (ref 1500–7800)
Neutrophils Relative %: 61.7 %
Platelets: 226 10*3/uL (ref 140–400)
RBC: 4.59 10*6/uL (ref 3.80–5.10)
RDW: 12.3 % (ref 11.0–15.0)
Total Lymphocyte: 31.4 %
WBC: 7.4 10*3/uL (ref 3.8–10.8)

## 2019-03-31 LAB — COMPLETE METABOLIC PANEL WITH GFR
AG Ratio: 2.6 (calc) — ABNORMAL HIGH (ref 1.0–2.5)
ALT: 20 U/L (ref 6–29)
AST: 18 U/L (ref 10–30)
Albumin: 4.6 g/dL (ref 3.6–5.1)
Alkaline phosphatase (APISO): 71 U/L (ref 31–125)
BUN: 9 mg/dL (ref 7–25)
CO2: 23 mmol/L (ref 20–32)
Calcium: 9.1 mg/dL (ref 8.6–10.2)
Chloride: 105 mmol/L (ref 98–110)
Creat: 0.75 mg/dL (ref 0.50–1.10)
GFR, Est African American: 133 mL/min/{1.73_m2} (ref >=60)
GFR, Est Non African American: 115 mL/min/{1.73_m2} (ref >=60)
Globulin: 1.8 g/dL (calc) — ABNORMAL LOW (ref 1.9–3.7)
Glucose, Bld: 119 mg/dL — ABNORMAL HIGH (ref 65–99)
Potassium: 3.6 mmol/L (ref 3.5–5.3)
Sodium: 137 mmol/L (ref 135–146)
Total Bilirubin: 0.5 mg/dL (ref 0.2–1.2)
Total Protein: 6.4 g/dL (ref 6.1–8.1)

## 2019-04-01 LAB — H. PYLORI BREATH TEST: H. pylori Breath Test: NOT DETECTED

## 2019-04-02 ENCOUNTER — Encounter: Payer: Self-pay | Admitting: Family Medicine

## 2019-04-02 ENCOUNTER — Other Ambulatory Visit: Payer: Self-pay | Admitting: Family Medicine

## 2019-04-02 DIAGNOSIS — K219 Gastro-esophageal reflux disease without esophagitis: Secondary | ICD-10-CM

## 2019-04-02 MED ORDER — OMEPRAZOLE 20 MG PO CPDR: 20.0000 mg | DELAYED_RELEASE_CAPSULE | Freq: Two times a day (BID) | ORAL | 1 refills | Status: DC

## 2019-05-23 ENCOUNTER — Encounter: Payer: Self-pay | Admitting: Family Medicine

## 2019-05-24 ENCOUNTER — Encounter: Payer: Self-pay | Admitting: Family Medicine

## 2019-05-24 ENCOUNTER — Other Ambulatory Visit: Payer: Self-pay | Admitting: Family Medicine

## 2019-05-24 ENCOUNTER — Other Ambulatory Visit: Payer: Self-pay

## 2019-05-24 ENCOUNTER — Other Ambulatory Visit
Admission: RE | Admit: 2019-05-24 | Discharge: 2019-05-24 | Disposition: A | Discharge status: Home / Self Care | Payer: Medicaid Other | Source: Ambulatory Visit | Attending: Family Medicine | Admitting: Family Medicine

## 2019-05-24 ENCOUNTER — Ambulatory Visit (INDEPENDENT_AMBULATORY_CARE_PROVIDER_SITE_OTHER): Payer: Medicaid Other | Admitting: Family Medicine

## 2019-05-24 DIAGNOSIS — R110 Nausea: Secondary | ICD-10-CM | POA: Diagnosis not present

## 2019-05-24 DIAGNOSIS — R1031 Right lower quadrant pain: Secondary | ICD-10-CM

## 2019-05-24 DIAGNOSIS — R197 Diarrhea, unspecified: Secondary | ICD-10-CM | POA: Insufficient documentation

## 2019-05-24 LAB — CBC WITH DIFFERENTIAL/PLATELET
Abs Immature Granulocytes: 0.02 10*3/uL (ref 0.00–0.07)
Basophils Absolute: 0 10*3/uL (ref 0.0–0.1)
Basophils Relative: 0 %
Eosinophils Absolute: 0.1 10*3/uL (ref 0.0–0.5)
Eosinophils Relative: 1 %
HCT: 40.2 % (ref 36.0–46.0)
Hemoglobin: 13.4 g/dL (ref 12.0–15.0)
Immature Granulocytes: 0 %
Lymphocytes Relative: 32 %
Lymphs Abs: 2.4 10*3/uL (ref 0.7–4.0)
MCH: 30.2 pg (ref 26.0–34.0)
MCHC: 33.3 g/dL (ref 30.0–36.0)
MCV: 90.5 fL (ref 80.0–100.0)
Monocytes Absolute: 0.6 10*3/uL (ref 0.1–1.0)
Monocytes Relative: 7 %
Neutro Abs: 4.6 10*3/uL (ref 1.7–7.7)
Neutrophils Relative %: 60 %
Platelets: 226 10*3/uL (ref 150–400)
RBC: 4.44 MIL/uL (ref 3.87–5.11)
RDW: 12.1 % (ref 11.5–15.5)
WBC: 7.7 10*3/uL (ref 4.0–10.5)
nRBC: 0 % (ref 0.0–0.2)

## 2019-05-24 LAB — URINALYSIS, ROUTINE W REFLEX MICROSCOPIC
Bacteria, UA: NONE SEEN
Bilirubin Urine: NEGATIVE
Glucose, UA: NEGATIVE mg/dL
Ketones, ur: NEGATIVE mg/dL
Leukocytes,Ua: NEGATIVE
Nitrite: NEGATIVE
Protein, ur: NEGATIVE mg/dL
Specific Gravity, Urine: 1.025 (ref 1.005–1.030)
pH: 5 (ref 5.0–8.0)

## 2019-05-24 LAB — COMPREHENSIVE METABOLIC PANEL
ALT: 24 U/L (ref 0–44)
AST: 22 U/L (ref 15–41)
Albumin: 4.2 g/dL (ref 3.5–5.0)
Alkaline Phosphatase: 75 U/L (ref 38–126)
Anion gap: 8 (ref 5–15)
BUN: 11 mg/dL (ref 6–20)
CO2: 21 mmol/L — ABNORMAL LOW (ref 22–32)
Calcium: 9.2 mg/dL (ref 8.9–10.3)
Chloride: 109 mmol/L (ref 98–111)
Creatinine, Ser: 0.58 mg/dL (ref 0.44–1.00)
GFR calc Af Amer: >60 mL/min (ref >60)
GFR calc non Af Amer: >60 mL/min (ref >60)
Glucose, Bld: 105 mg/dL — ABNORMAL HIGH (ref 70–99)
Potassium: 3.6 mmol/L (ref 3.5–5.1)
Sodium: 138 mmol/L (ref 135–145)
Total Bilirubin: 0.4 mg/dL (ref 0.3–1.2)
Total Protein: 6.9 g/dL (ref 6.5–8.1)

## 2019-05-24 LAB — PREGNANCY, URINE: Preg Test, Ur: NEGATIVE

## 2019-05-24 MED ORDER — DICYCLOMINE HCL 10 MG PO CAPS: 10.0000 mg | ORAL_CAPSULE | Freq: Three times a day (TID) | ORAL | 0 refills | Status: DC | PRN

## 2019-05-24 NOTE — Progress Notes (Signed)
Name: Susan Klein   MRN: [id number]    DOB: [birth year]   Date:05/24/2019       Progress Note  Subjective  Chief Complaint  Chief Complaint  Patient presents with  . Abdominal Pain    RLQ for 5 days  . Diarrhea    for 3 days, no appetite    I connected with  Susan Klein  on 05/24/19 at 11:40 AM EDT by a video enabled telemedicine application and verified that I am speaking with the correct person using two identifiers.  I discussed the limitations of evaluation and management by telemedicine and the availability of in person appointments. The patient expressed understanding and agreed to proceed. Staff also discussed with the patient that there may be a patient responsible charge related to this service. Patient Location: Home Provider Location: Office  Additional Individuals present: None  HPI  She notes abdominal discomfort starting 05/20/2019, woke up 05/21/2019-05/22/2019 multiple times throughout the night with diarrhea, then developed nausea.  Did have an episode of chills, no fevers.  She notes her abdominal pain started at the umbilicus and has now migrated to the RLQ. Denies vomiting, dysuria, back/flank pain.  She did go to urgent care, but they called our clinic and she obtained an appointment with us.   Patient Active Problem List   Diagnosis Date Noted  . Moderate episode of recurrent major depressive disorder (HCC) 01/06/2019  . Irritable bowel syndrome with constipation 01/06/2019  . Dyspareunia in female 12/23/2018  . 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    Social History   Tobacco Use  . Smoking status: Former Games developermoker  . Smokeless tobacco: Never Used  Substance Use Topics  . Alcohol use: No     Current Outpatient Medications:  .  Levonorgestrel (LILETTA, 52 MG,) 19.5 MCG/DAY IUD IUD, 1 each by Intrauterine route once. , Disp: , Rfl:  .  lubiprostone (AMITIZA) 8 MCG capsule, Take 1 capsule (8 mcg total) by mouth 2 (two) times daily with a meal., Disp:  180 capsule, Rfl: 1 .  omeprazole (PRILOSEC) 20 MG capsule, Take 1 capsule (20 mg total) by mouth 2 (two) times daily before a meal., Disp: 30 capsule, Rfl: 1 .  ondansetron (ZOFRAN) 4 MG tablet, Take 1 tablet (4 mg total) by mouth every 8 (eight) hours as needed for nausea or vomiting., Disp: 20 tablet, Rfl: 0 .  sertraline (ZOLOFT) 50 MG tablet, Take 1 tablet (50 mg total) by mouth daily., Disp: 90 tablet, Rfl: 0  Allergies  Allergen Reactions  . Mucinex [Guaifenesin Er] Hives    I personally reviewed active problem list, medication list, allergies, health maintenance, notes from last encounter, lab results with the patient/caregiver today.  ROS  Ten systems reviewed and is negative except as mentioned in HPI  Objective  Virtual encounter, vitals not obtained.  There is no height or weight on file to calculate BMI.  Nursing Note and Vital Signs reviewed.  Physical Exam  Constitutional: Patient appears well-developed and well-nourished. No distress.  HENT: Head: Normocephalic and atraumatic.  Neck: Normal range of motion. Pulmonary/Chest: Effort normal. No respiratory distress. Speaking in complete sentences Neurological: Pt is alert and oriented to person, place, and time. Coordination, speech and gait are normal.  Psychiatric: Patient has a normal mood and affect. behavior is normal. Judgment and thought content normal. Abdominal: She notes increased pain with palpation on the RLQ, however I am not able to palpate to assess for rebound  No results found for  this or any previous visit (from the past 72 hour(s)).  Assessment & Plan  1. Nausea - CBC with Differential/Platelet; Future - Pregnancy, urine; Future - Comprehensive metabolic panel; Future - Urinalysis, Routine w reflex microscopic; Future  2. Diarrhea, unspecified type - CBC with Differential/Platelet; Future - Comprehensive metabolic panel; Future - Urinalysis, Routine w reflex microscopic; Future  3. RLQ  abdominal pain - Advised we will get STAT labs first, if elevated WBC, will order CT to eval for appendicitis. - CBC with Differential/Platelet; Future - Pregnancy, urine; Future - Comprehensive metabolic panel; Future - Urinalysis, Routine w reflex microscopic; Future  -Red flags and when to present for emergency care or RTC including fever >101.72F, chest pain, shortness of breath, new/worsening/un-resolving symptoms, reviewed with patient at time of visit. Follow up and care instructions discussed and provided in AVS. - I discussed the assessment and treatment plan with the patient. The patient was provided an opportunity to ask questions and all were answered. The patient agreed with the plan and demonstrated an understanding of the instructions.  I provided 16 minutes of non-face-to-face time during this encounter.  Hubbard Hartshorn, FNP

## 2019-05-25 ENCOUNTER — Ambulatory Visit: Payer: Medicaid Other | Admitting: Family Medicine

## 2019-06-01 ENCOUNTER — Ambulatory Visit (INDEPENDENT_AMBULATORY_CARE_PROVIDER_SITE_OTHER): Payer: Medicaid Other | Admitting: Family Medicine

## 2019-06-01 ENCOUNTER — Encounter: Payer: Self-pay | Admitting: Family Medicine

## 2019-06-01 ENCOUNTER — Ambulatory Visit
Admission: RE | Admit: 2019-06-01 | Discharge: 2019-06-01 | Disposition: A | Discharge status: Home / Self Care | Payer: Medicaid Other | Source: Ambulatory Visit | Attending: Family Medicine | Admitting: Family Medicine

## 2019-06-01 ENCOUNTER — Other Ambulatory Visit: Payer: Self-pay

## 2019-06-01 DIAGNOSIS — K921 Melena: Secondary | ICD-10-CM | POA: Diagnosis not present

## 2019-06-01 DIAGNOSIS — R197 Diarrhea, unspecified: Secondary | ICD-10-CM

## 2019-06-01 DIAGNOSIS — R1031 Right lower quadrant pain: Secondary | ICD-10-CM

## 2019-06-01 MED ORDER — IOHEXOL 300 MG/ML  SOLN
100.0000 mL | Freq: Once | INTRAMUSCULAR | Status: AC | PRN
  Administered 2019-06-01: 100 mL via INTRAVENOUS

## 2019-06-01 NOTE — Progress Notes (Signed)
Name: Susan LucksHailey N Egolf   MRN: [id number]    DOB: [birth year]   Date:06/01/2019       Progress Note  Subjective  Chief Complaint  Chief Complaint  Patient presents with  . Abdominal Pain    Still not better    I connected with  Susan Klein  on 06/01/19 at  9:00 AM EDT by a video enabled telemedicine application and verified that I am speaking with the correct person using two identifiers.  I discussed the limitations of evaluation and management by telemedicine and the availability of in person appointments. The patient expressed understanding and agreed to proceed. Staff also discussed with the patient that there may be a patient responsible charge related to this service. Patient Location: Home Provider Location: Home Additional Individuals present: None  HPI  Pt presents with concern for ongoing RLQ pain and diarrhea - was seen 05/24/2019 for the same and things have not worsened, nor have they improved. Diarrhea episodes occurring >6 times a day, sometimes watery, sometimes loose stool.  She notes 1 episode of a small amount of dark blood in her stool about 5 days ago and 1 episode yesterday.  She did stop taking her Amitiza and the diarrhea has still not slowed.  No fevers/chills. - Her nausea has improved, however she is having trouble eating because of pain.  She does have ongoing diarrhea, no constipation.  She never tried the Zofran, and did not want to risk constipation as this is a listed SE with the bentyl.    - CBC, CMP were normal 05/24/2019 when labs were checked, UA and urine preg negative 05/24/2019 as well, pt has IUD in place.   Patient Active Problem List   Diagnosis Date Noted  . Moderate episode of recurrent major depressive disorder (HCC) 01/06/2019  . Irritable bowel syndrome with constipation 01/06/2019  . Dyspareunia in female 12/23/2018  . 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    Social History   Tobacco Use  . Smoking status: Former Games developermoker  . Smokeless  tobacco: Never Used  Substance Use Topics  . Alcohol use: No     Current Outpatient Medications:  .  Levonorgestrel (LILETTA, 52 MG,) 19.5 MCG/DAY IUD IUD, 1 each by Intrauterine route once. , Disp: , Rfl:  .  lubiprostone (AMITIZA) 8 MCG capsule, Take 1 capsule (8 mcg total) by mouth 2 (two) times daily with a meal., Disp: 180 capsule, Rfl: 1 .  sertraline (ZOLOFT) 50 MG tablet, Take 1 tablet (50 mg total) by mouth daily., Disp: 90 tablet, Rfl: 0 .  dicyclomine (BENTYL) 10 MG capsule, Take 1 capsule (10 mg total) by mouth 3 (three) times daily as needed for spasms. (Patient not taking: Reported on 06/01/2019), Disp: 20 capsule, Rfl: 0 .  omeprazole (PRILOSEC) 20 MG capsule, Take 1 capsule (20 mg total) by mouth 2 (two) times daily before a meal. (Patient not taking: Reported on 06/01/2019), Disp: 30 capsule, Rfl: 1 .  ondansetron (ZOFRAN) 4 MG tablet, Take 1 tablet (4 mg total) by mouth every 8 (eight) hours as needed for nausea or vomiting. (Patient not taking: Reported on 06/01/2019), Disp: 20 tablet, Rfl: 0  Allergies  Allergen Reactions  . Mucinex [Guaifenesin Er] Hives    I personally reviewed active problem list, medication list, allergies, notes from last encounter, lab results with the patient/caregiver today.  ROS  Constitutional: Negative for fever or weight change.  Respiratory: Negative for cough and shortness of breath.   Cardiovascular: Negative for  chest pain or palpitations.  Gastrointestinal: Negative for abdominal pain, no bowel changes.  Musculoskeletal: Negative for gait problem or joint swelling.  Skin: Negative for rash.  Neurological: Negative for dizziness or headache.  No other specific complaints in a complete review of systems (except as listed in HPI above).  Objective  Virtual encounter, vitals not obtained.  There is no height or weight on file to calculate BMI.  Nursing Note and Vital Signs reviewed.  Physical Exam  Constitutional: Patient  appears well-developed and well-nourished. No distress.  HENT: Head: Normocephalic and atraumatic.  Neck: Normal range of motion. Pulmonary/Chest: Effort normal. No respiratory distress. Speaking in complete sentences Neurological: Pt is alert and oriented to person, place, and time. Coordination, speech and gait are normal.  Psychiatric: Patient has a normal mood and affect. behavior is normal. Judgment and thought content normal. Abdominal: Patient palpates abdomen, denies tenderness to RUQ, LUQ, or LLQ.  Endorses increased tenderness on the RLQ.  UTA for rebound.  No results found for this or any previous visit (from the past 72 hour(s)).  Assessment & Plan  1. RLQ abdominal pain - Concern for gastritis vs appendicitis (though low suspicion based on stable symptoms and normal WBC at last visit) vs abdominal abscess, atypical presentation for diverticulitis vs other infectious cause.   Will refer to GI depending on results of CT and stool pathogen results. - Gastrointestinal Pathogen Panel PCR - CT Abdomen Pelvis W Contrast; Future  2. Diarrhea of presumed infectious origin - Gastrointestinal Pathogen Panel PCR  3. Blood in stool, frank - Gastrointestinal Pathogen Panel PCR - CT Abdomen Pelvis W Contrast; Future  -Red flags and when to present for emergency care or RTC including fever >101.8F, chest pain, shortness of breath, new/worsening/un-resolving symptoms,  reviewed with patient at time of visit. Follow up and care instructions discussed and provided in AVS. - I discussed the assessment and treatment plan with the patient. The patient was provided an opportunity to ask questions and all were answered. The patient agreed with the plan and demonstrated an understanding of the instructions.  I provided 16 minutes of non-face-to-face time during this encounter.  Hubbard Hartshorn, FNP

## 2019-06-02 ENCOUNTER — Other Ambulatory Visit: Payer: Self-pay | Admitting: Family Medicine

## 2019-06-02 DIAGNOSIS — R1031 Right lower quadrant pain: Secondary | ICD-10-CM

## 2019-06-02 DIAGNOSIS — R197 Diarrhea, unspecified: Secondary | ICD-10-CM

## 2019-06-02 DIAGNOSIS — R110 Nausea: Secondary | ICD-10-CM

## 2019-06-02 DIAGNOSIS — R1013 Epigastric pain: Secondary | ICD-10-CM

## 2019-06-02 DIAGNOSIS — K219 Gastro-esophageal reflux disease without esophagitis: Secondary | ICD-10-CM

## 2019-06-02 DIAGNOSIS — K921 Melena: Secondary | ICD-10-CM

## 2019-06-14 DIAGNOSIS — R197 Diarrhea, unspecified: Secondary | ICD-10-CM | POA: Diagnosis not present

## 2019-06-14 DIAGNOSIS — R1031 Right lower quadrant pain: Secondary | ICD-10-CM | POA: Diagnosis not present

## 2019-06-14 DIAGNOSIS — K921 Melena: Secondary | ICD-10-CM | POA: Diagnosis not present

## 2019-06-15 LAB — GASTROINTESTINAL PATHOGEN PANEL PCR
C. difficile Tox A/B, PCR: NOT DETECTED
Campylobacter, PCR: NOT DETECTED
Cryptosporidium, PCR: NOT DETECTED
E coli (ETEC) LT/ST PCR: NOT DETECTED
E coli (STEC) stx1/stx2, PCR: NOT DETECTED
E coli 0157, PCR: NOT DETECTED
Giardia lamblia, PCR: NOT DETECTED
Norovirus, PCR: NOT DETECTED
Rotavirus A, PCR: NOT DETECTED
Salmonella, PCR: NOT DETECTED
Shigella, PCR: NOT DETECTED

## 2019-06-16 ENCOUNTER — Other Ambulatory Visit: Payer: Self-pay | Admitting: Family Medicine

## 2019-06-30 ENCOUNTER — Other Ambulatory Visit: Payer: Self-pay

## 2019-06-30 ENCOUNTER — Encounter: Payer: Self-pay | Admitting: Gastroenterology

## 2019-06-30 ENCOUNTER — Ambulatory Visit (INDEPENDENT_AMBULATORY_CARE_PROVIDER_SITE_OTHER): Payer: Medicaid Other | Admitting: Gastroenterology

## 2019-06-30 VITALS — BP 132/79 | HR 84 | Temp 98.6°F | Resp 17 | Ht 63.0 in | Wt 177.0 lb

## 2019-06-30 DIAGNOSIS — R109 Unspecified abdominal pain: Secondary | ICD-10-CM

## 2019-06-30 DIAGNOSIS — R110 Nausea: Secondary | ICD-10-CM | POA: Diagnosis not present
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
## 2019-07-08 ENCOUNTER — Ambulatory Visit: Payer: Medicaid Other | Admitting: Family Medicine

## 2019-07-19 ENCOUNTER — Encounter: Payer: Self-pay | Admitting: Family Medicine

## 2019-07-19 ENCOUNTER — Other Ambulatory Visit: Payer: Self-pay

## 2019-07-19 ENCOUNTER — Ambulatory Visit (INDEPENDENT_AMBULATORY_CARE_PROVIDER_SITE_OTHER): Payer: Medicaid Other | Admitting: Family Medicine

## 2019-07-19 VITALS — BP 132/76 | HR 94 | Temp 97.1°F | Resp 16 | Ht 64.0 in | Wt 178.6 lb

## 2019-07-19 DIAGNOSIS — F331 Major depressive disorder, recurrent, moderate: Secondary | ICD-10-CM

## 2019-07-19 DIAGNOSIS — F419 Anxiety disorder, unspecified: Secondary | ICD-10-CM | POA: Diagnosis not present

## 2019-07-19 DIAGNOSIS — R198 Other specified symptoms and signs involving the digestive system and abdomen: Secondary | ICD-10-CM

## 2019-07-19 DIAGNOSIS — K581 Irritable bowel syndrome with constipation: Secondary | ICD-10-CM

## 2019-07-19 DIAGNOSIS — F5105 Insomnia due to other mental disorder: Secondary | ICD-10-CM | POA: Diagnosis not present

## 2019-07-19 DIAGNOSIS — Z23 Encounter for immunization: Secondary | ICD-10-CM | POA: Diagnosis not present

## 2019-07-19 DIAGNOSIS — R350 Frequency of micturition: Secondary | ICD-10-CM | POA: Diagnosis not present

## 2019-07-19 DIAGNOSIS — K30 Functional dyspepsia: Secondary | ICD-10-CM

## 2019-07-19 DIAGNOSIS — M41129 Adolescent idiopathic scoliosis, site unspecified: Secondary | ICD-10-CM | POA: Diagnosis not present

## 2019-07-19 DIAGNOSIS — R358 Other polyuria: Secondary | ICD-10-CM

## 2019-07-19 DIAGNOSIS — E6609 Other obesity due to excess calories: Secondary | ICD-10-CM

## 2019-07-19 DIAGNOSIS — R632 Polyphagia: Secondary | ICD-10-CM | POA: Diagnosis not present

## 2019-07-19 DIAGNOSIS — Z6830 Body mass index (BMI) 30.0-30.9, adult: Secondary | ICD-10-CM

## 2019-07-19 LAB — POCT GLYCOSYLATED HEMOGLOBIN (HGB A1C): HbA1c, POC (controlled diabetic range): 4.9 % (ref 0.0–7.0)

## 2019-07-19 NOTE — Progress Notes (Signed)
Name: Susan Klein   MRN: [id number]    DOB: 08-24-1996   Date:07/19/2019       Progress Note  Subjective  Chief Complaint  Chief Complaint  Patient presents with  . Medication Refill    6 month F/U  . Irritable Bowel Syndrome  . Depression    HPI  RLQ pain, nausea, diarrhea/constipation: Saw Dr. Allegra LaiVanga 06/30/2019 for evaluation after extensive work up with our clinic - she advised likely functional GI disorder, PPI for nausea, bentyl for cramping.  Pt opted to wait on fecal calprotectin levels. Her symptoms have since improved and she is only taking Amitiza now.    Anxiety and Depression: currently on zoloft 50mg  daily in the morning. She reports feeling better overall, but she is having increased urge to eat more frequently - snacking more, overeating - especially midday after lunch/before dinner. She feels her anxiety has been well controlled. She reports an increase in her appetite and also that she is still having some difficulty concentrating. She is still having trouble sleeping. We will switch zoloft to nighttime dosing; will consider buspar and/or wellbutrin for insomnia and overeating.   Scoliosis: Finished PT; her back pain is still variable - has about 50/50 good days/bad days depending on what activities she does throughout the day.  Declines additional evaluation at this time. Continue home exercises.   Obesity: Doing some exercise at home; busy with her 41mo daughter, was hard to get outside a lot when it was very hot. Diet is balanced.  She notes some polyphagia - we will check A1C today - she has had elevated glucose recently in non-fasting labs; does endorse polydipsia and polyuria.    Patient Active Problem List   Diagnosis Date Noted  . Moderate episode of recurrent major depressive disorder (HCC) 01/06/2019  . Irritable bowel syndrome with constipation 01/06/2019  . Dyspareunia in female 12/23/2018  . 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 NO PAST SURGERIES    . TONSILLECTOMY AND ADENOIDECTOMY Bilateral 12/28/2018   Procedure: TONSILLECTOMY;  Surgeon: Geanie LoganBennett, Paul, MD;  [facility];  Service: ENT;  Laterality: Bilateral;  Faxed over consent, physicians orders, and H&P/klp    Family History  Problem Relation Age of Onset  . Depression Mother   . Heart attack Mother   . Hypertension Mother   . Gestational diabetes Mother   . Heart attack Father   . Lung cancer Maternal Uncle   . Alzheimer's disease Paternal Grandmother     Social History   Socioeconomic History  . Marital status: Significant Other    Spouse name: Not on file  . Number of children: 1  . Years of education: Not on file  . Highest education level: Some college, no degree  Occupational History  . Occupation: stay at home mom  Social Needs  . Financial resource strain: Not hard at all  . Food insecurity    Worry: Sometimes true    Inability: Sometimes true  . Transportation needs    Medical: Yes    Non-medical: Yes  Tobacco Use  . Smoking status: Former Games developermoker  . Smokeless tobacco: Never Used  Substance and Sexual Activity  . Alcohol use: No  . Drug use: No  . Sexual activity: Yes    Partners: Male    Birth control/protection: I.U.D.  Lifestyle  . Physical activity    Days per week: 0 days    Minutes  per session: 0 min  . Stress: To some extent  Relationships  . Social connections    Talks on phone: More than three times a week    Gets together: Once a week    Attends religious service: Never    Active member of club or organization: No    Attends meetings of clubs or organizations: Never    Relationship status: Never married  . Intimate partner violence    Fear of current or ex partner: No    Emotionally abused: No    Physically abused: No    Forced sexual activity: No  Other Topics Concern  . Not on file  Social History Narrative  . Not on file     Current  Outpatient Medications:  .  dicyclomine (BENTYL) 10 MG capsule, Take 1 capsule (10 mg total) by mouth 3 (three) times daily as needed for spasms., Disp: 20 capsule, Rfl: 0 .  Levonorgestrel (LILETTA, 52 MG,) 19.5 MCG/DAY IUD IUD, 1 each by Intrauterine route once. , Disp: , Rfl:  .  lubiprostone (AMITIZA) 8 MCG capsule, Take 1 capsule (8 mcg total) by mouth 2 (two) times daily with a meal., Disp: 180 capsule, Rfl: 1 .  ondansetron (ZOFRAN) 4 MG tablet, Take 1 tablet (4 mg total) by mouth every 8 (eight) hours as needed for nausea or vomiting., Disp: 20 tablet, Rfl: 0 .  sertraline (ZOLOFT) 50 MG tablet, TAKE 1 TABLET BY MOUTH EVERY DAY, Disp: 90 tablet, Rfl: 1  Allergies  Allergen Reactions  . Mucinex [Guaifenesin Er] Hives    I personally reviewed active problem list, medication list, allergies, notes from last encounter, lab results with the patient/caregiver today.   ROS  Constitutional: Negative for fever or weight change.  Respiratory: Negative for cough and shortness of breath.   Cardiovascular: Negative for chest pain or palpitations.  Gastrointestinal: Negative for abdominal pain, no bowel changes.  Musculoskeletal: Negative for gait problem or joint swelling.  Skin: Negative for rash.  Neurological: Negative for dizziness or headache.  No other specific complaints in a complete review of systems (except as listed in HPI above).  Objective  Vitals:   07/19/19 1318 07/19/19 1357  BP: (!) 146/62 132/76  Pulse: (!) 115 94  Resp: 16   Temp: (!) 97.1 F (36.2 C)   TempSrc: Temporal   SpO2: 98% 99%  Weight: 178 lb 9.6 oz (81 kg)   Height: 5\' 4"  (1.626 m)    Body mass index is 30.66 kg/m.  She notes elevated HR and HTN in the past - had HTN in pregnancy as well. Never took medication for this in the past.   Physical Exam  Constitutional: Patient appears well-developed and well-nourished. No distress.  HENT: Head: Normocephalic and atraumatic. Eyes: Conjunctivae and EOM  are normal. No scleral icterus. Neck: Normal range of motion. Neck supple. No JVD present. No thyromegaly present.  Cardiovascular: Normal rate, regular rhythm and normal heart sounds.  No murmur heard. No BLE edema. Pulmonary/Chest: Effort normal and breath sounds normal. No respiratory distress. Musculoskeletal: Normal range of motion, no joint effusions. No gross deformities Neurological: Pt is alert and oriented to person, place, and time. No cranial nerve deficit. Coordination, balance, strength, speech and gait are normal.  Skin: Skin is warm and dry. No rash noted. No erythema.  Psychiatric: Patient has a normal mood and affect. behavior is normal. Judgment and thought content normal.  Results for orders placed or performed in visit on 07/19/19 (from the past  72 hour(s))  POCT glycosylated hemoglobin (Hb A1C)     Status: Normal   Collection Time: 07/19/19  2:06 PM  Result Value Ref Range   Hemoglobin A1C     HbA1c POC (<> result, manual entry)     HbA1c, POC (prediabetic range)     HbA1c, POC (controlled diabetic range) 4.9 0.0 - 7.0 %    PHQ2/9: Depression screen [facility] 2/9 07/19/2019 06/01/2019 05/24/2019 03/28/2019 01/06/2019  Decreased Interest 0 0 0 0 1  Down, Depressed, Hopeless 1 0 0 0 0  PHQ - 2 Score 1 0 0 0 1  Altered sleeping 1 0 0 0 3  Tired, decreased energy 1 0 0 0 0  Change in appetite 3 2 3  0 3  Feeling bad or failure about yourself  0 0 0 0 0  Trouble concentrating 3 0 0 0 1  Moving slowly or fidgety/restless 0 0 0 0 2  Suicidal thoughts 0 0 0 0 0  PHQ-9 Score 9 2 3  0 10  Difficult doing work/chores Somewhat difficult Not difficult at all Not difficult at all Not difficult at all Very difficult   PHQ-2/9 Result is positive.    Fall Risk: Fall Risk  07/19/2019 06/01/2019 05/24/2019 03/28/2019 01/06/2019  Falls in the past year? 1 1 1 1 1   Number falls in past yr: 1 1 1 1 1   Injury with Fall? 1 1 1 1 1   Comment Strain her left ankle twice Sprained left ankle 2 times - -  Sprain her left ankle  Risk for fall due to : - - History of fall(s) - -  Follow up - - Falls evaluation completed Falls evaluation completed -   Assessment & Plan  1. Functional GI symptoms - Seeing GI; symptoms have improved  2. Irritable bowel syndrome with constipation - Symptoms improving on their own  3. Moderate episode of recurrent major depressive disorder (HCC) - Switch to nighttime Zoloft dosing first; will consider buspar or Wellbutrin in the future.  4. Insomnia secondary to anxiety - Switch to nighttime Zoloft dosing first; will consider buspar or Wellbutrin in the future.  5. Need for immunization against influenza - Flu Vaccine QUAD 36+ mos IM  6. Adolescent idiopathic scoliosis, unspecified spinal region - Doing home exercises.  7. Class 1 obesity due to excess calories without serious comorbidity with body mass index (BMI) of 30.0 to 30.9 in adult - Discussed importance of 150 minutes of physical activity weekly, eat two servings of fish weekly, eat one serving of tree nuts ( cashews, pistachios, pecans, almonds.Marland Kitchen) every other day, eat 6 servings of fruit/vegetables daily and drink plenty of water and avoid sweet beverages.   8. Polyphagia - POCT glycosylated hemoglobin (Hb A1C)  9. Frequency of urination and polyuria - Urine Culture - Urinalysis, Complete

## 2019-07-21 LAB — URINE CULTURE
MICRO NUMBER:: 935487
Result:: NO GROWTH
SPECIMEN QUALITY:: ADEQUATE

## 2019-07-21 LAB — URINALYSIS, COMPLETE
Bacteria, UA: NONE SEEN /HPF
Bilirubin Urine: NEGATIVE
Glucose, UA: NEGATIVE
Hgb urine dipstick: NEGATIVE
Hyaline Cast: NONE SEEN /LPF
Ketones, ur: NEGATIVE
Leukocytes,Ua: NEGATIVE
Nitrite: NEGATIVE
Protein, ur: NEGATIVE
RBC / HPF: NONE SEEN /HPF (ref 0–2)
Specific Gravity, Urine: 1.015 (ref 1.001–1.03)
Squamous Epithelial / HPF: NONE SEEN /HPF (ref <=5)
WBC, UA: NONE SEEN /HPF (ref 0–5)
pH: 6.5 (ref 5.0–8.0)

## 2019-08-08 ENCOUNTER — Ambulatory Visit (INDEPENDENT_AMBULATORY_CARE_PROVIDER_SITE_OTHER): Payer: Medicaid Other | Admitting: Family Medicine

## 2019-08-08 ENCOUNTER — Encounter: Payer: Self-pay | Admitting: Family Medicine

## 2019-08-08 ENCOUNTER — Other Ambulatory Visit: Payer: Self-pay

## 2019-08-08 DIAGNOSIS — R358 Other polyuria: Secondary | ICD-10-CM | POA: Diagnosis not present

## 2019-08-08 DIAGNOSIS — F5105 Insomnia due to other mental disorder: Secondary | ICD-10-CM

## 2019-08-08 DIAGNOSIS — K30 Functional dyspepsia: Secondary | ICD-10-CM

## 2019-08-08 DIAGNOSIS — R3589 Other polyuria: Secondary | ICD-10-CM

## 2019-08-08 DIAGNOSIS — R198 Other specified symptoms and signs involving the digestive system and abdomen: Secondary | ICD-10-CM

## 2019-08-08 DIAGNOSIS — R350 Frequency of micturition: Secondary | ICD-10-CM | POA: Diagnosis not present

## 2019-08-08 DIAGNOSIS — R632 Polyphagia: Secondary | ICD-10-CM

## 2019-08-08 DIAGNOSIS — F331 Major depressive disorder, recurrent, moderate: Secondary | ICD-10-CM | POA: Diagnosis not present

## 2019-08-08 DIAGNOSIS — F419 Anxiety disorder, unspecified: Secondary | ICD-10-CM

## 2019-08-08 NOTE — Progress Notes (Signed)
Name: Susan Klein   MRN: [id number]    DOB: 01-Apr-1996   Date:08/08/2019       Progress Note  Subjective  Chief Complaint  Chief Complaint  Patient presents with  . Follow-up    2 Week recheck    I connected with  Susan Klein on 08/08/19 at  1:20 PM EDT by telephone and verified that I am speaking with the correct person using two identifiers.  I discussed the limitations, risks, security and privacy concerns of performing an evaluation and management service by telephone and the availability of in person appointments. Staff also discussed with the patient that there may be a patient responsible charge related to this service. Patient Location: Home Provider Location: Office Additional Individuals present: None  HPI  PT presents to follow up on elevated HR and BP along with polyphagia, polydipsia, and polyuria.  She notes improvement in polyphagia and polydipsia and polyuria  She has been eating 2 meals a day instead of 1.  A1C was normal at last visit.  She notes that her functional GI symptoms have also improved lately - constipation is improving.  I suspect this was contributing to her sensation of polyuria.  We discussed additional testing for these symptoms, however I recommend holding off since she is improving.  She is to continue taking Zoloft 50mg  and Amitiza as directed by GI.  Elevated HR/BP: Had elevations initially at last visit; has not checked her BP or HR at home.  She also notes no palpitations, chest pain, or shortness of breath.  She is not drinking any caffeine.  We will recheck at next visit, she will call sooner if any issues arise.  Patient Active Problem List   Diagnosis Date Noted  . Moderate episode of recurrent major depressive disorder (HCC) 01/06/2019  . Irritable bowel syndrome with constipation 01/06/2019  . Dyspareunia in female 12/23/2018  . 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  NO PAST SURGERIES    . TONSILLECTOMY AND ADENOIDECTOMY Bilateral 12/28/2018   Procedure: TONSILLECTOMY;  Surgeon: Geanie LoganBennett, Paul, MD;  [facility];  Service: ENT;  Laterality: Bilateral;  Faxed over consent, physicians orders, and H&P/klp    Family History  Problem Relation Age of Onset  . Depression Mother   . Heart attack Mother   . Hypertension Mother   . Gestational diabetes Mother   . Heart attack Father   . Lung cancer Maternal Uncle   . Alzheimer's disease Paternal Grandmother     Social History   Socioeconomic History  . Marital status: Significant Other    Spouse name: Not on file  . Number of children: 1  . Years of education: Not on file  . Highest education level: Some college, no degree  Occupational History  . Occupation: stay at home mom  Social Needs  . Financial resource strain: Not hard at all  . Food insecurity    Worry: Sometimes true    Inability: Sometimes true  . Transportation needs    Medical: Yes    Non-medical: Yes  Tobacco Use  . Smoking status: Former Games developermoker  . Smokeless tobacco: Never Used  Substance and Sexual Activity  . Alcohol use: No  . Drug use: No  . Sexual activity: Yes    Partners: Male    Birth control/protection: I.U.D.  Lifestyle  . Physical activity    Days per week: 0 days    Minutes  per session: 0 min  . Stress: To some extent  Relationships  . Social connections    Talks on phone: More than three times a week    Gets together: Once a week    Attends religious service: Never    Active member of club or organization: No    Attends meetings of clubs or organizations: Never    Relationship status: Never married  . Intimate partner violence    Fear of current or ex partner: No    Emotionally abused: No    Physically abused: No    Forced sexual activity: No  Other Topics Concern  . Not on file  Social History Narrative  . Not on file     Current Outpatient Medications:  .  dicyclomine (BENTYL)  10 MG capsule, Take 1 capsule (10 mg total) by mouth 3 (three) times daily as needed for spasms., Disp: 20 capsule, Rfl: 0 .  Levonorgestrel (LILETTA, 52 MG,) 19.5 MCG/DAY IUD IUD, 1 each by Intrauterine route once. , Disp: , Rfl:  .  lubiprostone (AMITIZA) 8 MCG capsule, Take 1 capsule (8 mcg total) by mouth 2 (two) times daily with a meal., Disp: 180 capsule, Rfl: 1 .  ondansetron (ZOFRAN) 4 MG tablet, Take 1 tablet (4 mg total) by mouth every 8 (eight) hours as needed for nausea or vomiting., Disp: 20 tablet, Rfl: 0 .  sertraline (ZOLOFT) 50 MG tablet, TAKE 1 TABLET BY MOUTH EVERY DAY, Disp: 90 tablet, Rfl: 1  Allergies  Allergen Reactions  . Mucinex [Guaifenesin Er] Hives    I personally reviewed active problem list, medication list, allergies, notes from last encounter, lab results with the patient/caregiver today.   ROS  Ten systems reviewed and is negative except as mentioned in HPI  Objective  Virtual encounter, vitals not obtained.  There is no height or weight on file to calculate BMI.  Physical Exam  Pulmonary/Chest: Effort normal. No respiratory distress. Speaking in complete sentences Neurological: Pt is alert and oriented to person, place, and time. Speech is normal Psychiatric: Patient has a normal mood and affect. behavior is normal. Judgment and thought content normal.  No results found for this or any previous visit (from the past 72 hour(s)).  PHQ2/9: Depression screen [facility] 2/9 08/08/2019 07/19/2019 06/01/2019 05/24/2019 03/28/2019  Decreased Interest 0 0 0 0 0  Down, Depressed, Hopeless 0 1 0 0 0  PHQ - 2 Score 0 1 0 0 0  Altered sleeping 0 1 0 0 0  Tired, decreased energy 0 1 0 0 0  Change in appetite 0 3 2 3  0  Feeling bad or failure about yourself  0 0 0 0 0  Trouble concentrating 0 3 0 0 0  Moving slowly or fidgety/restless 0 0 0 0 0  Suicidal thoughts 0 0 0 0 0  PHQ-9 Score 0 9 2 3  0  Difficult doing work/chores Not difficult at all Somewhat difficult  Not difficult at all Not difficult at all Not difficult at all   PHQ-2/9 Result is negative.    Fall Risk: Fall Risk  08/08/2019 07/19/2019 06/01/2019 05/24/2019 03/28/2019  Falls in the past year? 0 1 1 1 1   Number falls in past yr: 0 1 1 1 1   Injury with Fall? - 1 1 1 1   Comment - Strain her left ankle twice Sprained left ankle 2 times - -  Risk for fall due to : - - - History of fall(s) -  Follow up Falls  evaluation completed - - Falls evaluation completed Falls evaluation completed   Assessment & Plan  1. Functional GI symptoms - Doing well on Zoloft at night and amitiza  2. Moderate episode of recurrent major depressive disorder (HCC) - Taking zoloft 50mg  at night, feels her symptoms are well controlled  3. Insomnia secondary to anxiety - Sleeping better since switching zoloft to PM dosing.  4. Frequency of urination and polyuria - Resolving as her GI symptoms/constipation improve  5. Polyphagia - Dietary changes recommended - needs to eat 3 meals + 2 snacks a day to feel full throughout the day.  Listen to your body and eat when hungry.   I discussed the assessment and treatment plan with the patient. The patient was provided an opportunity to ask questions and all were answered. The patient agreed with the plan and demonstrated an understanding of the instructions.   The patient was advised to call back or seek an in-person evaluation if the symptoms worsen or if the condition fails to improve as anticipated.  I provided 13 minutes of non-face-to-face time during this encounter.  , FNP

## 2019-08-11 ENCOUNTER — Ambulatory Visit: Payer: Medicaid Other | Admitting: Gastroenterology

## 2019-08-23 ENCOUNTER — Other Ambulatory Visit: Payer: Self-pay

## 2019-08-23 DIAGNOSIS — Z20822 Contact with and (suspected) exposure to covid-19: Secondary | ICD-10-CM

## 2019-08-23 DIAGNOSIS — Z20828 Contact with and (suspected) exposure to other viral communicable diseases: Secondary | ICD-10-CM | POA: Diagnosis not present

## 2019-08-24 LAB — NOVEL CORONAVIRUS, NAA: SARS-CoV-2, NAA: NOT DETECTED

## 2019-09-04 ENCOUNTER — Encounter: Payer: Self-pay | Admitting: Family Medicine

## 2019-10-18 ENCOUNTER — Ambulatory Visit: Payer: Medicaid Other | Admitting: Family Medicine

## 2019-10-18 ENCOUNTER — Encounter: Payer: Self-pay | Admitting: Family Medicine

## 2019-10-18 ENCOUNTER — Other Ambulatory Visit: Payer: Self-pay

## 2019-10-18 VITALS — BP 124/72 | HR 100 | Temp 97.1°F | Resp 14 | Ht 64.0 in | Wt 186.5 lb

## 2019-10-18 DIAGNOSIS — R198 Other specified symptoms and signs involving the digestive system and abdomen: Secondary | ICD-10-CM

## 2019-10-18 DIAGNOSIS — K581 Irritable bowel syndrome with constipation: Secondary | ICD-10-CM | POA: Diagnosis not present

## 2019-10-18 DIAGNOSIS — E6609 Other obesity due to excess calories: Secondary | ICD-10-CM | POA: Diagnosis not present

## 2019-10-18 DIAGNOSIS — I7300 Raynaud's syndrome without gangrene: Secondary | ICD-10-CM

## 2019-10-18 DIAGNOSIS — F419 Anxiety disorder, unspecified: Secondary | ICD-10-CM

## 2019-10-18 DIAGNOSIS — Z6830 Body mass index (BMI) 30.0-30.9, adult: Secondary | ICD-10-CM | POA: Diagnosis not present

## 2019-10-18 DIAGNOSIS — K30 Functional dyspepsia: Secondary | ICD-10-CM | POA: Diagnosis not present

## 2019-10-18 DIAGNOSIS — M419 Scoliosis, unspecified: Secondary | ICD-10-CM

## 2019-10-18 DIAGNOSIS — F5105 Insomnia due to other mental disorder: Secondary | ICD-10-CM | POA: Diagnosis not present

## 2019-10-18 DIAGNOSIS — F331 Major depressive disorder, recurrent, moderate: Secondary | ICD-10-CM

## 2019-10-18 MED ORDER — SERTRALINE HCL 50 MG PO TABS: 50.0000 mg | ORAL_TABLET | Freq: Every day | ORAL | 1 refills | Status: DC

## 2019-10-18 MED ORDER — SERTRALINE HCL 25 MG PO TABS: 25.0000 mg | ORAL_TABLET | Freq: Every day | ORAL | 1 refills | Status: DC

## 2019-10-18 NOTE — Progress Notes (Signed)
Name: Susan Klein   MRN: [id number]    DOB: 01-19-1996   Date:10/18/2019       Progress Note  Subjective  Chief Complaint  Chief Complaint  Patient presents with  . Follow-up    3 month follow up    HPI  Functional Abdominal Symptoms: Saw Dr. Marius Ditch 06/30/2019 for evaluation after extensive work up with our clinic - she advised likely functional GI disorder, PPI for nausea, bentyl for cramping.  Pt opted to wait on fecal calprotectin levels. Her symptoms have since improved and she is only taking Amitiza PRN now.  She notes improvement with symptoms since eating more regular meal pattern/    Anxiety and Depression:currently on zoloft 50mg  daily in the morning. She reports feeling better overall, but still overeating and having trouble sleeping. She feels her anxiety has been well controlled - panic attacks, no SI/HI. She reports an increase in her appetite and also that she is still having some difficulty concentrating. She is still having trouble sleeping. Still having trouble sleeping - taking zoloft at night.  We will increase to 75mg  for better control.   Office Visit from 10/18/2019 in [facility]  PHQ-9 Total Score  11      Scoliosis: Her back pain is still variable - has about 50/50 good days/bad days depending on what activities she does throughout the day.  Declines additional evaluation at this time. Continue home exercises.  She is taking ibuprofen PRN at this time which does help when she takes - she limits use to very rarely when she has a particularly bad day.   Obesity: Doing some exercise at home (cardio); busy with her 2yo daughter.  Diet is balanced - trying to increase her vegetable intake.   Raynaud's: She notes history Raynaud's phenomenon since elementary school.  She notes has been worsening significantly recently and would like further evaluation.  She is having phenomenon to her bilateral hands is accompanied by numbness; face and the  rest of her body also responds very poorly to the cold - she will break out in red rash and have some swelling.   Patient Active Problem List   Diagnosis Date Noted  . Moderate episode of recurrent major depressive disorder ([place]) 01/06/2019  . Irritable bowel syndrome with constipation 01/06/2019  . Dyspareunia in female 12/23/2018  . 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 NO PAST SURGERIES    . TONSILLECTOMY AND ADENOIDECTOMY Bilateral 12/28/2018   Procedure: TONSILLECTOMY;  Surgeon: Clyde Canterbury, MD;  Location: [facility];  Service: ENT;  Laterality: Bilateral;  Faxed over consent, physicians orders, and H&P/klp   Family History  Problem Relation Age of Onset  . Depression Mother   . Heart attack Mother   . Hypertension Mother   . Gestational diabetes Mother   . Heart attack Father   . Lung cancer Maternal Uncle   . Alzheimer's disease Paternal Grandmother     Social History   Socioeconomic History  . Marital status: Significant Other    Spouse name: Not on file  . Number of children: 1  . Years of education: Not on file  . Highest education level: Some college, no degree  Occupational History  . Occupation: stay at home mom  Tobacco Use  . Smoking status: Former Research scientist (life sciences)  . Smokeless tobacco: Never Used  Substance and Sexual Activity  . Alcohol use: No  . Drug use: No  .  Sexual activity: Yes    Partners: Male    Birth control/protection: I.U.D.  Other Topics Concern  . Not on file  Social History Narrative  . Not on file   Social Determinants of Health   Financial Resource Strain: Low Risk   . Difficulty of Paying Living Expenses: Not hard at all  Food Insecurity: Food Insecurity Present  . Worried About Programme researcher, broadcasting/film/video in the Last Year: Sometimes true  . Ran Out of Food in the Last Year: Sometimes true  Transportation Needs: Unmet Transportation Needs  . Lack of Transportation  (Medical): Yes  . Lack of Transportation (Non-Medical): Yes  Physical Activity: Inactive  . Days of Exercise per Week: 0 days  . Minutes of Exercise per Session: 0 min  Stress: Stress Concern Present  . Feeling of Stress : To some extent  Social Connections: Moderately Isolated  . Frequency of Communication with Friends and Family: More than three times a week  . Frequency of Social Gatherings with Friends and Family: Once a week  . Attends Religious Services: Never  . Active Member of Clubs or Organizations: No  . Attends Banker Meetings: Never  . Marital Status: Never married  Intimate Partner Violence: Not At Risk  . Fear of Current or Ex-Partner: No  . Emotionally Abused: No  . Physically Abused: No  . Sexually Abused: No     Current Outpatient Medications:  .  Levonorgestrel (LILETTA, 52 MG,) 19.5 MCG/DAY IUD IUD, 1 each by Intrauterine route once. , Disp: , Rfl:  .  lubiprostone (AMITIZA) 8 MCG capsule, Take 1 capsule (8 mcg total) by mouth 2 (two) times daily with a meal., Disp: 180 capsule, Rfl: 1 .  sertraline (ZOLOFT) 50 MG tablet, TAKE 1 TABLET BY MOUTH EVERY DAY, Disp: 90 tablet, Rfl: 1 .  dicyclomine (BENTYL) 10 MG capsule, Take 1 capsule (10 mg total) by mouth 3 (three) times daily as needed for spasms., Disp: 20 capsule, Rfl: 0 .  ondansetron (ZOFRAN) 4 MG tablet, Take 1 tablet (4 mg total) by mouth every 8 (eight) hours as needed for nausea or vomiting., Disp: 20 tablet, Rfl: 0  Allergies  Allergen Reactions  . Mucinex [Guaifenesin Er] Hives    I personally reviewed active problem list, medication list, allergies, notes from last encounter, lab results with the patient/caregiver today.   ROS  Ten systems reviewed and is negative except as mentioned in HPI  Objective  Vitals:   10/18/19 1307  BP: 124/72  Pulse: 100  Resp: 14  Temp: (!) 97.1 F (36.2 C)  TempSrc: Temporal  SpO2: 99%  Weight: 186 lb 8 oz (84.6 kg)  Height: 5\' 4"  (1.626  m)   Body mass index is 32.01 kg/m.  Physical Exam  Constitutional: Patient appears well-developed and well-nourished. No distress.  HENT: Head: Normocephalic and atraumatic.  Eyes: Conjunctivae and EOM are normal. No scleral icterus.    Neck: Normal range of motion. Neck supple. No JVD present. Cardiovascular: Normal rate, regular rhythm and normal heart sounds.  No murmur heard. No BLE edema. Pulmonary/Chest: Effort normal and breath sounds normal. No respiratory distress. Musculoskeletal: Normal range of motion, no joint effusions. No gross deformities Neurological: Pt is alert and oriented to person, place, and time. No cranial nerve deficit. Coordination, balance, strength, speech and gait are normal.  Skin: Skin is warm and dry. No rash noted. No erythema.  Psychiatric: Patient has a normal mood and affect. behavior is normal. Judgment and thought  content normal.  No results found for this or any previous visit (from the past 72 hour(s)).  PHQ2/9: Depression screen [facility] 2/9 10/18/2019 08/08/2019 07/19/2019 06/01/2019 05/24/2019  Decreased Interest 0 0 0 0 0  Down, Depressed, Hopeless 0 0 1 0 0  PHQ - 2 Score 0 0 1 0 0  Altered sleeping 2 0 1 0 0  Tired, decreased energy 3 0 1 0 0  Change in appetite 3 0 3 2 3   Feeling bad or failure about yourself  1 0 0 0 0  Trouble concentrating 1 0 3 0 0  Moving slowly or fidgety/restless 1 0 0 0 0  Suicidal thoughts 0 0 0 0 0  PHQ-9 Score 11 0 9 2 3   Difficult doing work/chores Somewhat difficult Not difficult at all Somewhat difficult Not difficult at all Not difficult at all   PHQ-2/9 Result is positive.    Fall Risk: Fall Risk  10/18/2019 08/08/2019 07/19/2019 06/01/2019 05/24/2019  Falls in the past year? 0 0 1 1 1   Number falls in past yr: 0 0 1 1 1   Injury with Fall? 0 - 1 1 1   Comment - - Strain her left ankle twice Sprained left ankle 2 times -  Risk for fall due to : - - - - History of fall(s)  Follow up Falls evaluation  completed Falls evaluation completed - - Falls evaluation completed    Assessment & Plan  1. Irritable bowel syndrome with constipation - Continue Amitiza  2. Insomnia secondary to anxiety - Increase Zoloft; consider buspar if not effective on its own. - sertraline (ZOLOFT) 50 MG tablet; Take 1 tablet (50 mg total) by mouth at bedtime.  Dispense: 90 tablet; Refill: 1 - sertraline (ZOLOFT) 25 MG tablet; Take 1 tablet (25 mg total) by mouth daily.  Dispense: 90 tablet; Refill: 1  3. Moderate episode of recurrent major depressive disorder (HCC) - sertraline (ZOLOFT) 50 MG tablet; Take 1 tablet (50 mg total) by mouth at bedtime.  Dispense: 90 tablet; Refill: 1 - sertraline (ZOLOFT) 25 MG tablet; Take 1 tablet (25 mg total) by mouth daily.  Dispense: 90 tablet; Refill: 1  4. Functional GI symptoms - Amitiza  5. Class 1 obesity due to excess calories without serious comorbidity with body mass index (BMI) of 30.0 to 30.9 in adult - Discussed importance of 150 minutes of physical activity weekly, eat two servings of fish weekly, eat one serving of tree nuts ( cashews, pistachios, pecans, almonds.Marland Kitchen.) every other day, eat 6 servings of fruit/vegetables daily and drink plenty of water and avoid sweet beverages.  6. Scoliosis, unspecified scoliosis type, unspecified spinal region - Ibuprofen PRN; home exercises.  7. Raynaud's phenomenon without gangrene - Ambulatory referral to Rheumatology

## 2019-11-16 DIAGNOSIS — R6889 Other general symptoms and signs: Secondary | ICD-10-CM | POA: Diagnosis not present

## 2019-11-16 DIAGNOSIS — M79641 Pain in right hand: Secondary | ICD-10-CM | POA: Diagnosis not present

## 2019-11-16 DIAGNOSIS — M79642 Pain in left hand: Secondary | ICD-10-CM | POA: Diagnosis not present

## 2019-12-22 ENCOUNTER — Encounter: Payer: Self-pay | Admitting: Certified Nurse Midwife

## 2019-12-22 ENCOUNTER — Other Ambulatory Visit: Payer: Self-pay

## 2019-12-22 ENCOUNTER — Ambulatory Visit (INDEPENDENT_AMBULATORY_CARE_PROVIDER_SITE_OTHER): Payer: Medicaid Other | Admitting: Certified Nurse Midwife

## 2019-12-22 ENCOUNTER — Other Ambulatory Visit (HOSPITAL_COMMUNITY)
Admission: RE | Admit: 2019-12-22 | Discharge: 2019-12-22 | Disposition: A | Discharge status: Home / Self Care | Payer: Medicaid Other | Source: Ambulatory Visit | Attending: Certified Nurse Midwife | Admitting: Certified Nurse Midwife

## 2019-12-22 VITALS — BP 110/80 | HR 84 | Ht 63.0 in | Wt 180.0 lb

## 2019-12-22 DIAGNOSIS — Z01419 Encounter for gynecological examination (general) (routine) without abnormal findings: Secondary | ICD-10-CM | POA: Diagnosis not present

## 2019-12-22 DIAGNOSIS — Z124 Encounter for screening for malignant neoplasm of cervix: Secondary | ICD-10-CM

## 2019-12-22 DIAGNOSIS — Z975 Presence of (intrauterine) contraceptive device: Secondary | ICD-10-CM

## 2019-12-22 DIAGNOSIS — Z30431 Encounter for routine checking of intrauterine contraceptive device: Secondary | ICD-10-CM

## 2019-12-22 DIAGNOSIS — Z0000 Encounter for general adult medical examination without abnormal findings: Secondary | ICD-10-CM | POA: Diagnosis not present
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
## 2019-12-26 LAB — CYTOLOGY - PAP: Diagnosis: NEGATIVE

## 2020-01-21 ENCOUNTER — Telehealth: Payer: Medicaid Other | Admitting: Family

## 2020-01-21 DIAGNOSIS — J069 Acute upper respiratory infection, unspecified: Secondary | ICD-10-CM | POA: Diagnosis not present

## 2020-01-21 MED ORDER — PROMETHAZINE-DM 6.25-15 MG/5ML PO SYRP: 5.0000 mL | ORAL_SOLUTION | Freq: Four times a day (QID) | ORAL | 0 refills | Status: DC | PRN

## 2020-01-21 NOTE — Progress Notes (Signed)
We are sorry you are not feeling well.  Here is how we plan to help!  Based on what you have shared with me, it looks like you may have a viral upper respiratory infection.  Upper respiratory infections are caused by a large number of viruses; however, rhinovirus is the most common cause.   Symptoms vary from person to person, with common symptoms including sore throat, cough, fatigue or lack of energy and feeling of general discomfort.  A low-grade fever of up to 100.4 may present, but is often uncommon.  Symptoms vary however, and are closely related to a person's age or underlying illnesses.  The most common symptoms associated with an upper respiratory infection are nasal discharge or congestion, cough, sneezing, headache and pressure in the ears and face.  These symptoms usually persist for about 3 to 10 days, but can last up to 2 weeks.  It is important to know that upper respiratory infections do not cause serious illness or complications in most cases.    Upper respiratory infections can be transmitted from person to person, with the most common method of transmission being a person's hands.  The virus is able to live on the skin and can infect other persons for up to 2 hours after direct contact.  Also, these can be transmitted when someone coughs or sneezes; thus, it is important to cover the mouth to reduce this risk.  To keep the spread of the illness at bay, good hand hygiene is very important.  This is an infection that is most likely caused by a virus. There are no specific treatments other than to help you with the symptoms until the infection runs its course.  We are sorry you are not feeling well.  Here is how we plan to help!   For nasal congestion, you may use an oral decongestants such as Mucinex D or if you have glaucoma or high blood pressure use plain Mucinex.  Saline nasal spray or nasal drops can help and can safely be used as often as needed for congestion.    If you do not  have a history of heart disease, hypertension, diabetes or thyroid disease, prostate/bladder issues or glaucoma, you may also use Sudafed to treat nasal congestion.  It is highly recommended that you consult with a pharmacist or your primary care physician to ensure this medication is safe for you to take.     If you have a cough, you may use cough suppressants such as Delsym and Robitussin.  If you have glaucoma or high blood pressure, you can also use Coricidin HBP.   For cough I have prescribed for you a prescription cough medication Promethazine DM 1 tsp 3 times a day as needed.  If you have a sore or scratchy throat, use a saltwater gargle-  to  teaspoon of salt dissolved in a 4-ounce to 8-ounce glass of warm water.  Gargle the solution for approximately 15-30 seconds and then spit.  It is important not to swallow the solution.  You can also use throat lozenges/cough drops and Chloraseptic spray to help with throat pain or discomfort.  Warm or cold liquids can also be helpful in relieving throat pain.  For headache, pain or general discomfort, you can use Ibuprofen or Tylenol as directed.   Some authorities believe that zinc sprays or the use of Echinacea may shorten the course of your symptoms.   HOME CARE . Only take medications as instructed by your medical team. .  Be sure to drink plenty of fluids. Water is fine as well as fruit juices, sodas and electrolyte beverages. You may want to stay away from caffeine or alcohol. If you are nauseated, try taking small sips of liquids. How do you know if you are getting enough fluid? Your urine should be a pale yellow or almost colorless. . Get rest. . Taking a steamy shower or using a humidifier may help nasal congestion and ease sore throat pain. You can place a towel over your head and breathe in the steam from hot water coming from a faucet. . Using a saline nasal spray works much the same way. . Cough drops, hard candies and sore throat  lozenges may ease your cough. . Avoid close contacts especially the very young and the elderly . Cover your mouth if you cough or sneeze . Always remember to wash your hands.   GET HELP RIGHT AWAY IF: . You develop worsening fever. . If your symptoms do not improve within 10 days . You develop yellow or green discharge from your nose over 3 days. . You have coughing fits . You develop a severe head ache or visual changes. . You develop shortness of breath, difficulty breathing or start having chest pain . Your symptoms persist after you have completed your treatment plan  MAKE SURE YOU   Understand these instructions.  Will watch your condition.  Will get help right away if you are not doing well or get worse.  Your e-visit answers were reviewed by a board certified advanced clinical practitioner to complete your personal care plan. Depending upon the condition, your plan could have included both over the counter or prescription medications. Please review your pharmacy choice. If there is a problem, you may call our nursing hot line at and have the prescription routed to another pharmacy. Your safety is important to [place]. If you have drug allergies check your prescription carefully.   You can use MyChart to ask questions about today's visit, request a non-urgent call back, or ask for a work or school excuse for 24 hours related to this e-Visit. If it has been greater than 24 hours you will need to follow up with your provider, or enter a new e-Visit to address those concerns. You will get an e-mail in the next two days asking about your experience.  I hope that your e-visit has been valuable and will speed your recovery. Thank you for using e-visits.     Greater than 5 minutes, yet less than 10 minutes of time have been spent researching, coordinating, and implementing care for this patient today.  Thank you for the details you included in the comment boxes. Those details are very  helpful in determining the best course of treatment for you and help [place] to provide the best care.

## 2020-01-28 DIAGNOSIS — Z20828 Contact with and (suspected) exposure to other viral communicable diseases: Secondary | ICD-10-CM | POA: Diagnosis not present

## 2020-01-29 ENCOUNTER — Encounter: Payer: Self-pay | Admitting: Family Medicine

## 2020-03-22 ENCOUNTER — Encounter: Payer: Self-pay | Admitting: Family Medicine

## 2020-03-22 ENCOUNTER — Telehealth (INDEPENDENT_AMBULATORY_CARE_PROVIDER_SITE_OTHER): Payer: Medicaid Other | Admitting: Family Medicine

## 2020-03-22 VITALS — HR 90 | Ht 63.0 in | Wt 177.0 lb

## 2020-03-22 DIAGNOSIS — R635 Abnormal weight gain: Secondary | ICD-10-CM

## 2020-03-22 DIAGNOSIS — F331 Major depressive disorder, recurrent, moderate: Secondary | ICD-10-CM

## 2020-03-22 DIAGNOSIS — Z6831 Body mass index (BMI) 31.0-31.9, adult: Secondary | ICD-10-CM

## 2020-03-22 DIAGNOSIS — Z975 Presence of (intrauterine) contraceptive device: Secondary | ICD-10-CM | POA: Diagnosis not present

## 2020-03-22 DIAGNOSIS — F5105 Insomnia due to other mental disorder: Secondary | ICD-10-CM

## 2020-03-22 DIAGNOSIS — E6609 Other obesity due to excess calories: Secondary | ICD-10-CM | POA: Diagnosis not present

## 2020-03-22 DIAGNOSIS — F419 Anxiety disorder, unspecified: Secondary | ICD-10-CM

## 2020-03-22 DIAGNOSIS — E669 Obesity, unspecified: Secondary | ICD-10-CM | POA: Insufficient documentation

## 2020-03-22 MED ORDER — FLUOXETINE HCL 10 MG PO TABS: ORAL_TABLET | ORAL | 0 refills | Status: DC

## 2020-03-22 NOTE — Patient Instructions (Signed)
We will change your meds - its called a cross-taper;    Decreasing your current medicine while starting a new one.  This way we try to avoid any withdrawal from your meds that you've been on for several months   When you get the prozac from the pharmacy start the cross taper -   Week 1:  Start prozac 10 mg once daily  AND decrease zoloft to 50 mg daily    Week 2:  Increase prozac to 20 mg once daily AND decrease zoloft to 25 mg daily  Week 3:  Stop zoloft completely and

## 2020-03-22 NOTE — Progress Notes (Signed)
Name: Susan Klein   MRN: [id number]    DOB: 07/06/1996   Date:03/22/2020       Progress Note  Subjective:    Chief Complaint  Chief Complaint  Patient presents with  . Depression    wants to see about switching meds    I connected with  Jenel Lucks  on 03/22/20 at 11:40 AM EDT by a video enabled telemedicine application and verified that I am speaking with the correct person using two identifiers.  I discussed the limitations of evaluation and management by telemedicine and the availability of in person appointments. The patient expressed understanding and agreed to proceed. Staff also discussed with the patient that there may be a patient responsible charge related to this service. Patient Location: home Provider Location: [facility] Additional Individuals present: none  HPI  Pt was managing anxiety and depression with PCP - Maurice Small with dose increase of zoloft from 50 to 75 mg dose, initially it helped with her sx, but then feels like it stopped working, dose was changed about 6 months ago.  Depressive sx worse, anxiety sx are mild  Depression screen [facility] 2/9 03/22/2020 10/18/2019 08/08/2019  Decreased Interest 1 0 0  Down, Depressed, Hopeless 3 0 0  PHQ - 2 Score 4 0 0  Altered sleeping 2 2 0  Tired, decreased energy 2 3 0  Change in appetite 3 3 0  Feeling bad or failure about yourself  2 1 0  Trouble concentrating 1 1 0  Moving slowly or fidgety/restless 1 1 0  Suicidal thoughts 0 0 0  PHQ-9 Score 15 11 0  Difficult doing work/chores Somewhat difficult Somewhat difficult Not difficult at all  Some recent data might be hidden   GAD 7 : Generalized Anxiety Score 03/22/2020 06/01/2019 01/06/2019 11/25/2018  Nervous, Anxious, on Edge 1 1 0 3  Control/stop worrying 0 0 0 1  Worry too much - different things 0 0 0 2  Trouble relaxing 2 0 0 0  Restless 1 0 0 1  Easily annoyed or irritable 1 1 1 2   Afraid - awful might happen 0 0 0 2  Total GAD 7 Score 5 2 1 11   Anxiety  Difficulty Somewhat difficult Not difficult at all Not difficult at all Very difficult     She has been working on calorie deficit   Wt Readings from Last 5 Encounters:  03/22/20 177 lb (80.3 kg)  12/22/19 180 lb (81.6 kg)  10/18/19 186 lb 8 oz (84.6 kg)  07/19/19 178 lb 9.6 oz (81 kg)  06/30/19 177 lb (80.3 kg)   BMI Readings from Last 5 Encounters:  03/22/20 31.35 kg/m  12/22/19 31.89 kg/m  10/18/19 32.01 kg/m  07/19/19 30.66 kg/m  06/30/19 31.35 kg/m   She is concerned that her IUD/birth control has made her gain weight She over eats when depressed, she has not seen psychiatry or therapist before for this she has not previously done any nutritional counseling. Her prior PCP had mentioned that additional medication that could help with her appetite with weight loss and with smoking cessation, reviewed the last office visit notes PHQ and anxiety screenings, not mentioned in the documentation but likely Wellbutrin was mentioned to help with weight loss mood and smoking cessation.  Patient has had hyperglycemia in the past but last A1c was normal, do not see any lipid screening and I do not see that she has had a past diagnosis of metabolic syndrome or insulin  resistance.  Her BMI is in the obese category weight has increased over the past year and only recently begun to go back down to what it was over a year ago  Patient notes that in the past with oral birth control pills she gained 40 pounds when taking the pill She went to OB/GYN a few months ago and will follow up with them regarding the IUD      Patient Active Problem List   Diagnosis Date Noted  . Class 1 obesity due to excess calories without serious comorbidity with body mass index (BMI) of 30.0 to 30.9 in adult 03/22/2020  . Moderate episode of recurrent major depressive disorder (HCC) 01/06/2019  . Irritable bowel syndrome with constipation 01/06/2019  . 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    Social  History   Tobacco Use  . Smoking status: Former Games developer  . Smokeless tobacco: Never Used  Substance Use Topics  . Alcohol use: No     Current Outpatient Medications:  .  Levonorgestrel (LILETTA, 52 MG,) 19.5 MCG/DAY IUD IUD, 1 each by Intrauterine route once. , Disp: , Rfl:  .  lubiprostone (AMITIZA) 8 MCG capsule, Take 1 capsule (8 mcg total) by mouth 2 (two) times daily with a meal. (Patient taking differently: Take 8 mcg by mouth as needed. ), Disp: 180 capsule, Rfl: 1 .  sertraline (ZOLOFT) 25 MG tablet, Take 1 tablet (25 mg total) by mouth daily., Disp: 90 tablet, Rfl: 1 .  sertraline (ZOLOFT) 50 MG tablet, Take 1 tablet (50 mg total) by mouth at bedtime., Disp: 90 tablet, Rfl: 1  Allergies  Allergen Reactions  . Mucinex [Guaifenesin Er] Hives   I personally reviewed active problem list, medication list, allergies, family history, social history, health maintenance, notes from last encounter, lab results, imaging with the patient/caregiver today.   Review of Systems  10 Systems reviewed and are negative for acute change except as noted in the HPI.   Objective:   Virtual encounter, vitals limited, only able to obtain the following Today's Vitals   03/22/20 1152  Pulse: 90  Weight: 177 lb (80.3 kg)  Height: 5\' 3"  (1.6 m)   Body mass index is 31.35 kg/m. Nursing Note and Vital Signs reviewed.  Physical Exam Vitals and nursing note reviewed.  Constitutional:      General: She is not in acute distress.    Appearance: Normal appearance. She is obese. She is not ill-appearing, toxic-appearing or diaphoretic.  Neurological:     Mental Status: She is alert.  Psychiatric:        Mood and Affect: Mood normal.        Behavior: Behavior normal.     PE limited by telephone encounter  No results found for this or any previous visit (from the past 72 hour(s)).  Assessment and Plan:     ICD-10-CM   1. Moderate episode of recurrent major depressive disorder (HCC)  F33.1     zoloft dose increase not effective, cross taper to prozac, reviewed instructions extensively, close f/up, add wellbutrin after cross taper   2. Insomnia secondary to anxiety  F41.9    F51.05   3. Class 1 obesity due to excess calories without serious comorbidity with body mass index (BMI) of 31.0 to 31.9 in adult  E66.09    Z68.31    has lost weight with lifestyle and diet effort, congratulated - encouraged her to continue efforts, doing well, can add wellbutrin, possibly try phentermine?  4. Weight gain  R63.5    screen TSH? f/up OBGYN for birth control concerns, continue calorie deficit, will try to add wellbutrin after changing SSRIs  5. Uses intrauterine device as primary birth control method  Z97.5    f/up with OBGYN about IUD concerns and SE, she was instructed to let us know if she needs new referral     Plan will be to cross taper her medications hope to get better control of depressive symptoms currently her anxiety symptoms are are mild  We will decrease Zoloft from 75 mg daily to 50 mg daily while starting Prozac 10 mg will do this for 7 days then decrease Zoloft again to 25 mg daily while increasing Prozac to 20 mg daily for the second week, and we will keep her at 20 mg of Prozac daily while discontinuing Zoloft completely.  We will do a follow-up in about 3 weeks from now to see how her cross taper is going if she feels good we will add Wellbutrin to her medicines at that time see if they will help with her weight, eating habits and help with some of her depressive symptoms.  Patient was in a good mood, she denied any suicidal ideations, she had been working very diligently on her health and her diet was encouraged to keep working on this without beating herself up if she has a few times when she splurges or eats things she enjoys.  Did discuss other options such as nutritional counseling or trying nutritional programs such as new more weight watchers.  She was referred back to OB/GYN  to discuss her IUD and other birth control options    -Red flags and when to present for emergency care or RTC including fever >101.52F, chest pain, shortness of breath, new/worsening/un-resolving symptoms, reviewed with patient at time of visit. Follow up and care instructions discussed and provided in AVS. - I discussed the assessment and treatment plan with the patient. The patient was provided an opportunity to ask questions and all were answered. The patient agreed with the plan and demonstrated an understanding of the instructions.  I provided 30+ minutes of non-face-to-face time during this encounter.  Delsa Grana, PA-C 03/22/20 12:03 PM

## 2020-04-02 ENCOUNTER — Encounter: Payer: Self-pay | Admitting: Certified Nurse Midwife

## 2020-04-06 NOTE — Telephone Encounter (Signed)
Scheduled

## 2020-04-09 NOTE — Telephone Encounter (Signed)
I was calling patient to go over prescreening questions and realized she doesn't have an ultrasound order for 04/10/20 appointment. Please place order thank you

## 2020-04-10 ENCOUNTER — Other Ambulatory Visit: Payer: Self-pay

## 2020-04-10 ENCOUNTER — Other Ambulatory Visit: Payer: Self-pay | Admitting: Certified Nurse Midwife

## 2020-04-10 ENCOUNTER — Ambulatory Visit (INDEPENDENT_AMBULATORY_CARE_PROVIDER_SITE_OTHER): Payer: Medicaid Other

## 2020-04-10 ENCOUNTER — Encounter: Payer: Self-pay | Admitting: Certified Nurse Midwife

## 2020-04-10 ENCOUNTER — Ambulatory Visit (INDEPENDENT_AMBULATORY_CARE_PROVIDER_SITE_OTHER): Payer: Medicaid Other | Admitting: Certified Nurse Midwife

## 2020-04-10 VITALS — BP 130/70 | HR 68 | Resp 18 | Ht 63.0 in | Wt 184.2 lb

## 2020-04-10 DIAGNOSIS — Z30431 Encounter for routine checking of intrauterine contraceptive device: Secondary | ICD-10-CM

## 2020-04-10 DIAGNOSIS — N939 Abnormal uterine and vaginal bleeding, unspecified: Secondary | ICD-10-CM

## 2020-04-10 DIAGNOSIS — N938 Other specified abnormal uterine and vaginal bleeding: Secondary | ICD-10-CM

## 2020-04-10 DIAGNOSIS — Z975 Presence of (intrauterine) contraceptive device: Secondary | ICD-10-CM

## 2020-04-10 LAB — POCT WET PREP (WET MOUNT): Trichomonas Wet Prep HPF POC: ABSENT

## 2020-04-10 LAB — POCT URINE PREGNANCY: Preg Test, Ur: NEGATIVE
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
## 2020-04-11 DIAGNOSIS — N939 Abnormal uterine and vaginal bleeding, unspecified: Secondary | ICD-10-CM | POA: Diagnosis not present

## 2020-04-12 ENCOUNTER — Ambulatory Visit (INDEPENDENT_AMBULATORY_CARE_PROVIDER_SITE_OTHER): Payer: Medicaid Other | Admitting: Family Medicine

## 2020-04-12 ENCOUNTER — Encounter: Payer: Self-pay | Admitting: Family Medicine

## 2020-04-12 ENCOUNTER — Other Ambulatory Visit: Payer: Self-pay

## 2020-04-12 VITALS — BP 102/68 | HR 100 | Temp 97.9°F | Resp 16 | Ht 63.0 in | Wt 180.6 lb

## 2020-04-12 DIAGNOSIS — Z833 Family history of diabetes mellitus: Secondary | ICD-10-CM | POA: Diagnosis not present

## 2020-04-12 DIAGNOSIS — K581 Irritable bowel syndrome with constipation: Secondary | ICD-10-CM | POA: Diagnosis not present

## 2020-04-12 DIAGNOSIS — F331 Major depressive disorder, recurrent, moderate: Secondary | ICD-10-CM | POA: Diagnosis not present

## 2020-04-12 DIAGNOSIS — F419 Anxiety disorder, unspecified: Secondary | ICD-10-CM | POA: Diagnosis not present

## 2020-04-12 DIAGNOSIS — F5105 Insomnia due to other mental disorder: Secondary | ICD-10-CM

## 2020-04-12 MED ORDER — FLUOXETINE HCL 20 MG PO TABS: 20.0000 mg | ORAL_TABLET | Freq: Every day | ORAL | 3 refills | Status: DC

## 2020-04-12 NOTE — Patient Instructions (Signed)
Keep taking 20 mg prozac a day - let me know if you develop any side effects or have concerns  We will do your complete physical at the end of the year  I always recommend talking to a therapist or counselor to help with life stressors, coping skills and therapy for anxiety and/or depression.  We will check your blood sugar at your physical, but your last labs were wonderful! Lab Results  Component Value Date   HGBA1C 4.9 07/19/2019   Prediabetes is 5.7% to 6.4% and Diabetes is >7.5%   Living With Depression Everyone experiences occasional disappointment, sadness, and loss in their lives. When you are feeling down, blue, or sad for at least 2 weeks in a row, it may mean that you have depression. Depression can affect your thoughts and feelings, relationships, daily activities, and physical health. It is caused by changes in the way your brain functions. If you receive a diagnosis of depression, your health care provider will tell you which type of depression you have and what treatment options are available to you. If you are living with depression, there are ways to help you recover from it and also ways to prevent it from coming back. How to cope with lifestyle changes Coping with stress     Stress is your body's reaction to life changes and events, both good and bad. Stressful situations may include:  Getting married.  The death of a spouse.  Losing a job.  Retiring.  Having a baby. Stress can last just a few hours or it can be ongoing. Stress can play a major role in depression, so it is important to learn both how to cope with stress and how to think about it differently. Talk with your health care provider or a counselor if you would like to learn more about stress reduction. He or she may suggest some stress reduction techniques, such as:  Music therapy. This can include creating music or listening to music. Choose music that you enjoy and that inspires  you.  Mindfulness-based meditation. This kind of meditation can be done while sitting or walking. It involves being aware of your normal breaths, rather than trying to control your breathing.  Centering prayer. This is a kind of meditation that involves focusing on a spiritual word or phrase. Choose a word, phrase, or sacred image that is meaningful to you and that brings you peace.  Deep breathing. To do this, expand your stomach and inhale slowly through your nose. Hold your breath for 3-5 seconds, then exhale slowly, allowing your stomach muscles to relax.  Muscle relaxation. This involves intentionally tensing muscles then relaxing them. Choose a stress reduction technique that fits your lifestyle and personality. Stress reduction techniques take time and practice to develop. Set aside 5-15 minutes a day to do them. Therapists can offer training in these techniques. The training may be covered by some insurance plans. Other things you can do to manage stress include:  Keeping a stress diary. This can help you learn what triggers your stress and ways to control your response.  Understanding what your limits are and saying no to requests or events that lead to a schedule that is too full.  Thinking about how you respond to certain situations. You may not be able to control everything, but you can control how you react.  Adding humor to your life by watching funny films or TV shows.  Making time for activities that help you relax and not feeling guilty  about spending your time this way.  Medicines Your health care provider may suggest certain medicines if he or she feels that they will help improve your condition. Avoid using alcohol and other substances that may prevent your medicines from working properly (may interact). It is also important to:  Talk with your pharmacist or health care provider about all the medicines that you take, their possible side effects, and what medicines are safe  to take together.  Make it your goal to take part in all treatment decisions (shared decision-making). This includes giving input on the side effects of medicines. It is best if shared decision-making with your health care provider is part of your total treatment plan. If your health care provider prescribes a medicine, you may not notice the full benefits of it for 4-8 weeks. Most people who are treated for depression need to be on medicine for at least 6-12 months after they feel better. If you are taking medicines as part of your treatment, do not stop taking medicines without first talking to your health care provider. You may need to have the medicine slowly decreased (tapered) over time to decrease the risk of harmful side effects. Relationships Your health care provider may suggest family therapy along with individual therapy and drug therapy. While there may not be family problems that are causing you to feel depressed, it is still important to make sure your family learns as much as they can about your mental health. Having your family's support can help make your treatment successful. How to recognize changes in your condition Everyone has a different response to treatment for depression. Recovery from major depression happens when you have not had signs of major depression for two months. This may mean that you will start to:  Have more interest in doing activities.  Feel less hopeless than you did 2 months ago.  Have more energy.  Overeat less often, or have better or improving appetite.  Have better concentration. Your health care provider will work with you to decide the next steps in your recovery. It is also important to recognize when your condition is getting worse. Watch for these signs:  Having fatigue or low energy.  Eating too much or too little.  Sleeping too much or too little.  Feeling restless, agitated, or hopeless.  Having trouble concentrating or making  decisions.  Having unexplained physical complaints.  Feeling irritable, angry, or aggressive. Get help as soon as you or your family members notice these symptoms coming back. How to get support and help from others How to talk with friends and family members about your condition  Talking to friends and family members about your condition can provide you with one way to get support and guidance. Reach out to trusted friends or family members, explain your symptoms to them, and let them know that you are working with a health care provider to treat your depression. Financial resources Not all insurance plans cover mental health care, so it is important to check with your insurance carrier. If paying for co-pays or counseling services is a problem, search for a local or county mental health care center. They may be able to offer public mental health care services at low or no cost when you are not able to see a private health care provider. If you are taking medicine for depression, you may be able to get the generic form, which may be less expensive. Some makers of prescription medicines also offer help to patients  who cannot afford the medicines they need. Follow these instructions at home:   Get the right amount and quality of sleep.  Cut down on using caffeine, tobacco, alcohol, and other potentially harmful substances.  Try to exercise, such as walking or lifting small weights.  Take over-the-counter and prescription medicines only as told by your health care provider.  Eat a healthy diet that includes plenty of vegetables, fruits, whole grains, low-fat dairy products, and lean protein. Do not eat a lot of foods that are high in solid fats, added sugars, or salt.  Keep all follow-up visits as told by your health care provider. This is important. Contact a health care provider if:  You stop taking your antidepressant medicines, and you have any of these  symptoms: ? Nausea. ? Headache. ? Feeling lightheaded. ? Chills and body aches. ? Not being able to sleep (insomnia).  You or your friends and family think your depression is getting worse. Get help right away if:  You have thoughts of hurting yourself or others. If you ever feel like you may hurt yourself or others, or have thoughts about taking your own life, get help right away. You can go to your nearest emergency department or call:  Your local emergency services (911 in the [place]  A suicide crisis helpline, such as the [facility] at 2012089651. This is open 24-hours a day. Summary  If you are living with depression, there are ways to help you recover from it and also ways to prevent it from coming back.  Work with your health care team to create a management plan that includes counseling, stress management techniques, and healthy lifestyle habits. This information is not intended to replace advice given to you by your health care provider. Make sure you discuss any questions you have with your health care provider. Document Revised: 01/28/2019 Document Reviewed: 09/08/2016 Elsevier Patient Education  [place].

## 2020-04-12 NOTE — Progress Notes (Signed)
Patient ID: Susan Klein, female    DOB: 11/10/1995, 24 y.o.   MRN: [id number]  PCP: Danelle Berry, PA-C  Chief Complaint  Patient presents with  . Follow-up  . Depression    medicaiton working well    Subjective:   Susan Klein is a 24 y.o. female, presents to clinic with CC of the following:  HPI  Pt presents for f/up on depression with recent med changes:  When you get the prozac from the pharmacy start the cross taper -   Week 1:  Start prozac 10 mg once daily  AND decrease zoloft to 50 mg daily    Week 2:  Increase prozac to 20 mg once daily AND decrease zoloft to 25 mg daily  Week 3:  Stop zoloft completely  Depression screen [facility] 2/9 04/12/2020 03/22/2020 10/18/2019  Decreased Interest 1 1 0  Down, Depressed, Hopeless 1 3 0  PHQ - 2 Score 2 4 0  Altered sleeping 1 2 2   Tired, decreased energy 1 2 3   Change in appetite 2 3 3   Feeling bad or failure about yourself  1 2 1   Trouble concentrating 1 1 1   Moving slowly or fidgety/restless 0 1 1  Suicidal thoughts 0 0 0  PHQ-9 Score 8 15 11   Difficult doing work/chores Somewhat difficult Somewhat difficult Somewhat difficult  Some recent data might be hidden   phq reviewed today and improving  She feels that med changes are helping and she did not have an problems with cross taper, she does not want to go up on meds  Sleep is ok, little daughter stays up late and pt likes to get a little time to herself, sleeping about 8 hours a night  GI sx/IBS - ongoing, no worsening, has plenty of amitiza   She is concerned with family hx of DM, and her PMHx of prediabetes - last A1C reviewed, family hx updated, no past gestational DM  Patient Active Problem List   Diagnosis Date Noted  . Class 1 obesity due to excess calories without serious comorbidity with body mass index (BMI) of 30.0 to 30.9 in adult 03/22/2020  . Cold sensitivity 11/16/2019  . Moderate episode of recurrent major depressive disorder (HCC)  01/06/2019  . Irritable bowel syndrome with constipation 01/06/2019  . 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      Current Outpatient Medications:  .  Levonorgestrel (LILETTA, 52 MG,) 19.5 MCG/DAY IUD IUD, 1 each by Intrauterine route once. , Disp: , Rfl:  .  lubiprostone (AMITIZA) 8 MCG capsule, Take 1 capsule (8 mcg total) by mouth 2 (two) times daily with a meal. (Patient taking differently: Take 8 mcg by mouth as needed. ), Disp: 180 capsule, Rfl: 1 .  FLUoxetine (PROZAC) 20 MG tablet, Take 1 tablet (20 mg total) by mouth daily., Disp: 90 tablet, Rfl: 3   Allergies  Allergen Reactions  . Mucinex [Guaifenesin Er] Hives     Social History   Tobacco Use  . Smoking status: Former  . Smokeless tobacco: Never Used  Vaping Use  . Vaping Use: Former  . Substances: Nicotine, Flavoring  . Devices: 05/22/2020, has vaped for about 1 yr  Substance Use Topics  . Alcohol use: No  . Drug use: No      Chart Review Today: I personally reviewed active problem list, medication list, allergies, family history, social history, health maintenance, notes from last encounter, lab results, imaging with the patient/caregiver today.   Review  of Systems 10 Systems reviewed and are negative for acute change except as noted in the HPI.     Objective:   Vitals:   04/12/20 1141  BP: 102/68  Pulse: 100  Resp: 16  Temp: 97.9 F (36.6 C)  TempSrc: Temporal  SpO2: 99%  Weight: 180 lb 9.6 oz (81.9 kg)  Height: 5\' 3"  (1.6 m)    Body mass index is 31.99 kg/m.  Physical Exam Vitals and nursing note reviewed.  Constitutional:      General: She is not in acute distress.    Appearance: Normal appearance. She is well-developed. She is not ill-appearing, toxic-appearing or diaphoretic.  HENT:     Head: Normocephalic and atraumatic.     Right Ear: External ear normal.     Left Ear: External ear normal.  Eyes:     General:        Right eye: No discharge.        Left eye: No discharge.       Conjunctiva/sclera: Conjunctivae normal.  Neck:     Trachea: No tracheal deviation.  Cardiovascular:     Rate and Rhythm: Normal rate and regular rhythm.     Pulses: Normal pulses.     Heart sounds: Normal heart sounds. No murmur heard.  No friction rub. No gallop.   Pulmonary:     Effort: Pulmonary effort is normal. No respiratory distress.     Breath sounds: Normal breath sounds. No stridor.  Musculoskeletal:        General: Normal range of motion.  Skin:    General: Skin is warm and dry.     Findings: No rash.  Neurological:     Mental Status: She is alert.     Motor: No abnormal muscle tone.     Coordination: Coordination normal.  Psychiatric:        Attention and Perception: Attention normal.        Mood and Affect: Mood and affect normal.        Speech: Speech normal.        Behavior: Behavior normal.        Thought Content: Thought content normal.      Results for orders placed or performed in visit on 04/10/20  POCT urine pregnancy  Result Value Ref Range   Preg Test, Ur Negative Negative  POCT Wet Prep [facility])  Result Value Ref Range   Source Wet Prep POC vagina    WBC, Wet Prep HPF POC     Bacteria Wet Prep HPF POC Few Few   BACTERIA WET PREP MORPHOLOGY POC     Clue Cells Wet Prep HPF POC None None   Clue Cells Wet Prep Whiff POC     Yeast Wet Prep HPF POC None None   KOH Wet Prep POC     Trichomonas Wet Prep HPF POC Absent Absent       Assessment & Plan:     1. Moderate episode of recurrent major depressive disorder (HCC) Did well with cross taper, she feels med change is helping with depressed mood, PHQ improving, encouraged her to f/up sooner if she feels like she needs dose adjustment  Options to increase dose to 30 mg  Could add wellbutrin?  She is very soft spoken, hard to tell if she is just shy or if she has flat affect/depressed mood - wellbutrin may be helpful against physical sx? F/up in the next 5-6 months when she comes for  CPE  So  far she feels better, no SE or concerns with current med, dose or with recent cross taper - FLUoxetine (PROZAC) 20 MG tablet; Take 1 tablet (20 mg total) by mouth daily.  Dispense: 90 tablet; Refill: 3  2. Insomnia secondary to anxiety 8 hours of sleep, no current significant sleep disturbances  3. Irritable bowel syndrome with constipation No change from baseline sx  4. Family history of diabetes mellitus in grandmother She was concerned with finding out family hx and pt reports she is prediabetic - updated hx, reviewed her last A1C with her, discussed healthy lifestyle habits and diet that will help prevent DM, but sometimes it is genetic, reassured her we will monitor and recheck with next physical.    Delsa Grana, PA-C 04/12/20 12:06 PM

## 2020-04-13 LAB — CHLAMYDIA/GONOCOCCUS/TRICHOMONAS, NAA
Chlamydia by NAA: NEGATIVE
Gonococcus by NAA: NEGATIVE
Trich vag by NAA: NEGATIVE

## 2020-04-19 DIAGNOSIS — Z419 Encounter for procedure for purposes other than remedying health state, unspecified: Secondary | ICD-10-CM | POA: Diagnosis not present

## 2020-05-20 DIAGNOSIS — Z419 Encounter for procedure for purposes other than remedying health state, unspecified: Secondary | ICD-10-CM | POA: Diagnosis not present

## 2020-06-20 DIAGNOSIS — Z419 Encounter for procedure for purposes other than remedying health state, unspecified: Secondary | ICD-10-CM | POA: Diagnosis not present

## 2020-07-19 ENCOUNTER — Other Ambulatory Visit: Payer: Self-pay | Admitting: Family Medicine

## 2020-07-19 DIAGNOSIS — F331 Major depressive disorder, recurrent, moderate: Secondary | ICD-10-CM

## 2020-07-20 DIAGNOSIS — Z419 Encounter for procedure for purposes other than remedying health state, unspecified: Secondary | ICD-10-CM | POA: Diagnosis not present

## 2020-07-23 ENCOUNTER — Telehealth: Payer: Self-pay | Admitting: Family Medicine

## 2020-07-23 MED ORDER — FLUOXETINE HCL 20 MG PO CAPS: 20.0000 mg | ORAL_CAPSULE | Freq: Every day | ORAL | 2 refills | Status: DC

## 2020-07-23 NOTE — Telephone Encounter (Signed)
Medication Refill - Medication: FLUoxetine (PROZAC) 20 MG tablet  Pts insurance will no longer cover the tabs and will need RX for capsules/ please advise  Pt would like Rx asap due to taking last tab today   Has the patient contacted their pharmacy? Yes.   (Agent: If no, request that the patient contact the pharmacy for the refill.) (Agent: If yes, when and what did the pharmacy advise?)Pt stated pharmacy advised they have been trying to reach office for a week with no response   Preferred Pharmacy (with phone number or street name):  [facility] [address], [place] - [address]  [address] [place], [place] [address]  Phone:  [phone] Fax:  [phone] Agent: Please be advised that RX refills may take up to 3 business days. We ask that you follow-up with your pharmacy.

## 2020-07-23 NOTE — Telephone Encounter (Signed)
Pt requesting Fluoxetine capsules rather than tablets

## 2020-07-23 NOTE — Addendum Note (Signed)
Addended by: Danelle Berry on: 07/23/2020 04:24 PM   Modules accepted: Orders

## 2020-08-10 ENCOUNTER — Ambulatory Visit (INDEPENDENT_AMBULATORY_CARE_PROVIDER_SITE_OTHER): Payer: Medicaid Other | Admitting: Emergency Medicine

## 2020-08-10 ENCOUNTER — Other Ambulatory Visit: Payer: Self-pay

## 2020-08-10 DIAGNOSIS — Z23 Encounter for immunization: Secondary | ICD-10-CM

## 2020-08-20 DIAGNOSIS — Z419 Encounter for procedure for purposes other than remedying health state, unspecified: Secondary | ICD-10-CM | POA: Diagnosis not present

## 2020-09-18 ENCOUNTER — Other Ambulatory Visit: Payer: Self-pay

## 2020-09-18 ENCOUNTER — Encounter: Payer: Self-pay | Admitting: Family Medicine

## 2020-09-18 ENCOUNTER — Ambulatory Visit: Payer: Medicaid Other | Admitting: Family Medicine

## 2020-09-18 VITALS — BP 124/82 | HR 91 | Temp 98.2°F | Resp 18 | Ht 63.0 in | Wt 180.1 lb

## 2020-09-18 DIAGNOSIS — Z0000 Encounter for general adult medical examination without abnormal findings: Secondary | ICD-10-CM

## 2020-09-18 DIAGNOSIS — Z1159 Encounter for screening for other viral diseases: Secondary | ICD-10-CM

## 2020-09-18 DIAGNOSIS — F331 Major depressive disorder, recurrent, moderate: Secondary | ICD-10-CM

## 2020-09-18 NOTE — Progress Notes (Signed)
Patient: Susan Klein, Female    DOB: Feb 11, 1996, 24 y.o.   MRN: [id number] Danelle Berry, PA-C Visit Date: 09/18/2020  Today's Provider: Danelle Berry, PA-C   Chief Complaint  Patient presents with  . Annual Exam   Subjective:   Annual physical exam:  Susan Klein is a 24 y.o. female who presents today for complete physical exam:  Exercise/Activity:   Active with her toddler Diet/nutrition: Trying to eat healthy Sleep: Sleeping well   USPSTF grade A and B recommendations - reviewed and addressed today   Depression:   June 2021 meds were adjusted, cross taper - came off zoloft and started prozac On 20 mg daily dose Phq 9 completed today by patient, was reviewed by me with patient in the room PHQ score is mildly positive and improving, pt feels good PHQ 2/9 Scores 09/18/2020 04/12/2020 03/22/2020 10/18/2019  PHQ - 2 Score 1 2 4  0  PHQ- 9 Score 4 8 15 11    Depression screen [facility] 2/9 09/18/2020 04/12/2020 03/22/2020 10/18/2019 08/08/2019  Decreased Interest 0 1 1 0 0  Down, Depressed, Hopeless 1 1 3  0 0  PHQ - 2 Score 1 2 4  0 0  Altered sleeping 1 1 2 2  0  Tired, decreased energy 1 1 2 3  0  Change in appetite 1 2 3 3  0  Feeling bad or failure about yourself  0 1 2 1  0  Trouble concentrating 0 1 1 1  0  Moving slowly or fidgety/restless 0 0 1 1 0  Suicidal thoughts 0 0 0 0 0  PHQ-9 Score 4 8 15 11  0  Difficult doing work/chores Not difficult at all Somewhat difficult Somewhat difficult Somewhat difficult Not difficult at all  Some recent data might be hidden    Alcohol screening:   Office Visit from 09/18/2020 in [facility]  AUDIT-C Score 0      Immunizations and Health Maintenance: Health Maintenance  Topic Date Due  . COVID-19 Vaccine (1) Never done  . Hepatitis C Screening  04/10/2021 (Originally 1996-06-04)  . CHLAMYDIA SCREENING  04/11/2021  . PAP-Cervical Cytology Screening  12/22/2022  . PAP SMEAR-Modifier  12/22/2022  .  TETANUS/TDAP  10/25/2027  . INFLUENZA VACCINE  Completed  . HIV Screening  Completed     Hep C Screening: due- previously declined but willing to do today  STD testing and prevention (HIV/chl/gon/syphilis):  see above, no additional testing desired by pt today Testing done this year  Intimate partner violence: Denies abuse, feels safe at home with her fianc and her daughter  Sexual History/Pain during Intercourse: Significant Other  Menstrual History/LMP/Abnormal Bleeding: Regular menses no concerns Patient's last menstrual period was 08/30/2020.  Incontinence Symptoms: Denies  Breast cancer:  Not due per age, no significant family hx or known genetic testing  Cervical cancer screening:UTD  Osteoporosis:   Discussion on osteoporosis per age, including high calcium and vitamin D supplementation, weight bearing exercises  Skin cancer:  Hx of skin CA -  NO Discussed atypical lesions   Colorectal cancer:   N/A per age Discussed concerning signs and sx of CRC, pt denies melena, hematochezia  Lung cancer:   Low Dose CT Chest recommended if Age 60-80 years, 30 pack-year currently smoking OR have quit w/in 15years. Patient does not qualify.    Social History   Tobacco Use  . Smoking status: Former  . Smokeless tobacco: Never Used  Vaping Use  . Vaping Use: Former  . Substances:  Nicotine, Flavoring  . 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 Alcohol use: No  . Drug use: No       [facility] Visit from 09/18/2020 in [facility]  AUDIT-C Score 0      Family History  Problem Relation Age of Onset  . Depression Mother   . Heart attack Mother   . Hypertension Mother   . Gestational diabetes Mother   . Heart attack Father   . Lung cancer Maternal Uncle   . Alzheimer's disease Paternal Grandmother   . Diabetes Maternal Grandmother      Blood pressure/Hypertension: BP Readings from Last 3 Encounters:  09/18/20 124/82    04/12/20 102/68  04/10/20 130/70    Weight/Obesity: Wt Readings from Last 3 Encounters:  09/18/20 180 lb 1.6 oz (81.7 kg)  04/12/20 180 lb 9.6 oz (81.9 kg)  04/10/20 184 lb 3.2 oz (83.6 kg)   BMI Readings from Last 3 Encounters:  09/18/20 31.90 kg/m  04/12/20 31.99 kg/m  04/10/20 32.63 kg/m     Lipids:  No results found for: CHOL No results found for: HDL No results found for: LDLCALC No results found for: TRIG No results found for: CHOLHDL No results found for: LDLDIRECT Based on the results of lipid panel his/her cardiovascular risk factor ( using [place] Cohort )  in the next 10 years is: The ASCVD Risk score Denman George DC Jr., et al., 2013) failed to calculate for the following reasons:   The 2013 ASCVD risk score is only valid for ages 2 to 92 Glucose:  Glucose, Bld  Date Value Ref Range Status  05/24/2019 105 (H) 70 - 99 mg/dL Final  40/98/1191 478 (H) 65 - 99 mg/dL Final    Comment:    .            Fasting reference interval . For someone without known diabetes, a glucose value between 100 and 125 mg/dL is consistent with prediabetes and should be confirmed with a follow-up test. .   10/21/2017 67 65 - 99 mg/dL Final   Hypertension: BP Readings from Last 3 Encounters:  09/18/20 124/82  04/12/20 102/68  04/10/20 130/70   Obesity: Wt Readings from Last 3 Encounters:  09/18/20 180 lb 1.6 oz (81.7 kg)  04/12/20 180 lb 9.6 oz (81.9 kg)  04/10/20 184 lb 3.2 oz (83.6 kg)   BMI Readings from Last 3 Encounters:  09/18/20 31.90 kg/m  04/12/20 31.99 kg/m  04/10/20 32.63 kg/m      Advanced Care Planning:  A voluntary discussion about advance care planning including the explanation and discussion of advance directives.     Social History      She        Social History   Socioeconomic History  . Marital status: Significant Other    Spouse name: Not on file  . Number of children: 1  . Years of education: Not on file  . Highest education level:  Some college, no degree  Occupational History  . Occupation: stay at home mom  Tobacco Use  . Smoking status: Former Games developer  . Smokeless tobacco: Never Used  Vaping Use  . Vaping Use: Former  . Substances: Nicotine, Flavoring  . Devices: Sheppard Plumber, has vaped for about 1 yr  Substance and Sexual Activity  . Alcohol use: No  . Drug use: No  . Sexual activity: Yes    Partners: Male    Birth control/protection: I.U.D.  Other Topics  Concern  . Not on file  Social History Narrative  . Not on file   Social Determinants of Health   Financial Resource Strain: Low Risk   . Difficulty of Paying Living Expenses: Not hard at all  Food Insecurity: No Food Insecurity  . Worried About Programme researcher, broadcasting/film/video in the Last Year: Never true  . Ran Out of Food in the Last Year: Never true  Transportation Needs: No Transportation Needs  . Lack of Transportation (Medical): No  . Lack of Transportation (Non-Medical): No  Physical Activity: Insufficiently Active  . Days of Exercise per Week: 3 days  . Minutes of Exercise per Session: 20 min  Stress: No Stress Concern Present  . Feeling of Stress : Only a little  Social Connections: Socially Isolated  . Frequency of Communication with Friends and Family: Never  . Frequency of Social Gatherings with Friends and Family: Never  . Attends Religious Services: Never  . Active Member of Clubs or Organizations: No  . Attends Banker Meetings: Never  . Marital Status: Living with partner    Family History        Family History  Problem Relation Age of Onset  . Depression Mother   . Heart attack Mother   . Hypertension Mother   . Gestational diabetes Mother   . Heart attack Father   . Lung cancer Maternal Uncle   . Alzheimer's disease Paternal Grandmother   . Diabetes Maternal Grandmother     Patient Active Problem List   Diagnosis Date Noted  . Class 1 obesity due to excess calories without serious comorbidity with body mass index (BMI)  of 30.0 to 30.9 in adult 03/22/2020  . Cold sensitivity 11/16/2019  . Moderate episode of recurrent major depressive disorder (HCC) 01/06/2019  . Irritable bowel syndrome with constipation 01/06/2019  . 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 TONSILLECTOMY AND ADENOIDECTOMY Bilateral 12/28/2018   Procedure: TONSILLECTOMY;  Surgeon: Geanie Logan, MD;  Location: [facility];  Service: ENT;  Laterality: Bilateral;  Faxed over consent, physicians orders, and H&P/klp     Current Outpatient Medications:  .  FLUoxetine (PROZAC) 20 MG capsule, Take 1 capsule (20 mg total) by mouth daily., Disp: 90 capsule, Rfl: 2 .  Levonorgestrel (LILETTA, 52 MG,) 19.5 MCG/DAY IUD IUD, 1 each by Intrauterine route once. , Disp: , Rfl:  .  lubiprostone (AMITIZA) 8 MCG capsule, Take 1 capsule (8 mcg total) by mouth 2 (two) times daily with a meal. (Patient taking differently: Take 8 mcg by mouth as needed. ), Disp: 180 capsule, Rfl: 1  Allergies  Allergen Reactions  . Mucinex [Guaifenesin Er] Hives    Patient Care Team: Danelle Berry, PA-C as PCP - General (Family Medicine)  Review of Systems  10 Systems reviewed and are negative for acute change except as noted in the HPI.  I personally reviewed active problem list, medication list, allergies, family history, social history, health maintenance, notes from last encounter, lab results, imaging with the patient/caregiver today.        Objective:   Vitals:  Vitals:   09/18/20 1110  BP: 124/82  Pulse: 91  Resp: 18  Temp: 98.2 F (36.8 C)  TempSrc: Oral  SpO2: 95%  Weight: 180 lb 1.6 oz (81.7 kg)  Height: 5\' 3"  (1.6 m)    Body mass index is 31.9 kg/m.  Physical Exam Vitals and nursing note reviewed.  Constitutional:  General: She is not in acute distress.    Appearance: Normal appearance. She is well-developed. She is obese. She is not ill-appearing, toxic-appearing or  diaphoretic.     Interventions: Face mask in place.  HENT:     Head: Normocephalic and atraumatic.     Right Ear: External ear normal.     Left Ear: External ear normal.  Eyes:     General: Lids are normal. No scleral icterus.       Right eye: No discharge.        Left eye: No discharge.     Conjunctiva/sclera: Conjunctivae normal.  Neck:     Trachea: Phonation normal. No tracheal deviation.  Cardiovascular:     Rate and Rhythm: Normal rate and regular rhythm.     Pulses: Normal pulses.          Radial pulses are 2+ on the right side and 2+ on the left side.       Posterior tibial pulses are 2+ on the right side and 2+ on the left side.     Heart sounds: Normal heart sounds. No murmur heard.  No friction rub. No gallop.   Pulmonary:     Effort: Pulmonary effort is normal. No respiratory distress.     Breath sounds: Normal breath sounds. No stridor. No wheezing, rhonchi or rales.  Chest:     Chest wall: No tenderness.  Abdominal:     General: Bowel sounds are normal. There is no distension.     Palpations: Abdomen is soft.  Musculoskeletal:     Right lower leg: No edema.     Left lower leg: No edema.  Skin:    General: Skin is warm and dry.     Coloration: Skin is not jaundiced or pale.     Findings: No rash.  Neurological:     Mental Status: She is alert.     Motor: No abnormal muscle tone.     Gait: Gait normal.  Psychiatric:        Mood and Affect: Mood normal.        Speech: Speech normal.        Behavior: Behavior normal.       Fall Risk: Fall Risk  09/18/2020 04/12/2020 03/22/2020 10/18/2019 08/08/2019  Falls in the past year? 0 0 0 0 0  Number falls in past yr: 0 0 0 0 0  Injury with Fall? 0 0 0 0 -  Comment - - - - -  Risk for fall due to : - - - - -  Follow up Falls evaluation completed - - Falls evaluation completed Falls evaluation completed    Functional Status Survey: Is the patient deaf or have difficulty hearing?: No Does the patient have  difficulty seeing, even when wearing glasses/contacts?: Yes Does the patient have difficulty concentrating, remembering, or making decisions?: Yes Does the patient have difficulty walking or climbing stairs?: No Does the patient have difficulty dressing or bathing?: No Does the patient have difficulty doing errands alone such as visiting a doctor's office or shopping?: No     Assessment & Plan:    CPE completed today  . USPSTF grade A and B recommendations reviewed with patient; age-appropriate recommendations, preventive care, screening tests, etc discussed and encouraged; healthy living encouraged; see AVS for patient education given to patient  . Discussed importance of 150 minutes of physical activity weekly, AHA exercise recommendations given to pt in AVS/handout  . Discussed importance of healthy diet:  eating lean meats and proteins, avoiding trans fats and saturated fats, avoid simple sugars and excessive carbs in diet, eat 6 servings of fruit/vegetables daily and drink plenty of water and avoid sweet beverages.    . Recommended pt to do annual eye exam and routine dental exams/cleanings  . Depression, alcohol, fall screening completed as documented above and per flowsheets  . Reviewed Health Maintenance: Health Maintenance  Topic Date Due  . COVID-19 Vaccine (1) Never done  . Hepatitis C Screening  04/10/2021 (Originally 25-Sep-1996)  . CHLAMYDIA SCREENING  04/11/2021  . PAP-Cervical Cytology Screening  12/22/2022  . PAP SMEAR-Modifier  12/22/2022  . TETANUS/TDAP  10/25/2027  . INFLUENZA VACCINE  Completed  . HIV Screening  Completed    . Immunizations: Immunization History  Administered Date(s) Administered  . Influenza,inj,Quad PF,6+ Mos 08/06/2017, 07/19/2019, 08/10/2020  . Tdap 10/24/2017     ICD-10-CM   1. Adult general medical exam  Z00.00 CBC with Differential/Platelet    COMPLETE METABOLIC PANEL WITH GFR  2. Encounter for hepatitis C screening test for low  risk patient  Z11.59 Hepatitis C antibody  3. Moderate episode of recurrent major depressive disorder (HCC)  F33.1    PHQ 9 reviewed, score improving she does like the Prozac 20 mg dose she is currently on has no concerns    Upon leaving exam today patient mentions a history of scoliosis and has had some intermittent low back pain that radiates around hips she wanted her back checked she does have a mild curvature and elevation of her right thoracic paraspinal muscles compared to her left I offered a referral to physical therapy but she declined at this time    Danelle BerryLeisa Barrett Holthaus, Cordelia Poche-C 09/18/20 6:23 PM  [facility]

## 2020-09-18 NOTE — Patient Instructions (Signed)
Preventive Care 22-21 Years Old, Female Preventive care refers to lifestyle choices and visits with your health care provider that can promote health and wellness. At this stage in your life, you may start seeing a primary care physician instead of a pediatrician. Your health care is now your responsibility. Preventive care for young adults includes:  A yearly physical exam. This is also called an annual wellness visit.  Regular dental and eye exams.  Immunizations.  Screening for certain conditions.  Healthy lifestyle choices, such as diet and exercise. What can I expect for my preventive care visit? Physical exam Your health care provider may check:  Height and weight. These may be used to calculate body mass index (BMI), which is a measurement that tells if you are at a healthy weight.  Heart rate and blood pressure.  Body temperature. Counseling Your health care provider may ask you questions about:  Past medical problems and family medical history.  Alcohol, tobacco, and drug use.  Home and relationship well-being.  Access to firearms.  Emotional well-being.  Diet, exercise, and sleep habits.  Sexual activity and sexual health.  Method of birth control.  Menstrual cycle.  Pregnancy history. What immunizations do I need?  Influenza (flu) vaccine  This is recommended every year. Tetanus, diphtheria, and pertussis (Tdap) vaccine  You may need a Td booster every 10 years. Varicella (chickenpox) vaccine  You may need this vaccine if you have not already been vaccinated. Human papillomavirus (HPV) vaccine  If recommended by your health care provider, you may need three doses over 6 months. Measles, mumps, and rubella (MMR) vaccine  You may need at least one dose of MMR. You may also need a second dose. Meningococcal conjugate (MenACWY) vaccine  One dose is recommended if you are 22-21 years old and a first-year college student living in a residence hall,  or if you have one of several medical conditions. You may also need additional booster doses. Pneumococcal conjugate (PCV13) vaccine  You may need this if you have certain conditions and were not previously vaccinated. Pneumococcal polysaccharide (PPSV23) vaccine  You may need one or two doses if you smoke cigarettes or if you have certain conditions. Hepatitis A vaccine  You may need this if you have certain conditions or if you travel or work in places where you may be exposed to hepatitis A. Hepatitis B vaccine  You may need this if you have certain conditions or if you travel or work in places where you may be exposed to hepatitis B. Haemophilus influenzae type b (Hib) vaccine  You may need this if you have certain risk factors. You may receive vaccines as individual doses or as more than one vaccine together in one shot (combination vaccines). Talk with your health care provider about the risks and benefits of combination vaccines. What tests do I need? Blood tests  Lipid and cholesterol levels. These may be checked every 5 years starting at age 22.  Hepatitis C test.  Hepatitis B test. Screening  Pelvic exam and Pap test. This may be done every 3 years starting at age 22.  Sexually transmitted disease (STD) testing, if you are at risk.  BRCA-related cancer screening. This may be done if you have a family history of breast, ovarian, tubal, or peritoneal cancers. Other tests  Tuberculosis skin test.  Vision and hearing tests.  Skin exam.  Breast exam. Follow these instructions at home: Eating and drinking   Eat a diet that includes fresh fruits and   vegetables, whole grains, lean protein, and low-fat dairy products.  Drink enough fluid to keep your urine pale yellow.  Do not drink alcohol if: ? Your health care provider tells you not to drink. ? You are pregnant, may be pregnant, or are planning to become pregnant. ? You are under the legal drinking age. In the  [place] the legal drinking age is 22.  If you drink alcohol: ? Limit how much you have to 0-1 drink a day. ? Be aware of how much alcohol is in your drink. In the [place] one drink equals one 12 oz bottle of beer (355 mL), one 5 oz glass of wine (148 mL), or one 1 oz glass of hard liquor (44 mL). Lifestyle  Take daily care of your teeth and gums.  Stay active. Exercise at least 30 minutes 5 or more days of the week.  Do not use any products that contain nicotine or tobacco, such as cigarettes, e-cigarettes, and chewing tobacco. If you need help quitting, ask your health care provider.  Do not use drugs.  If you are sexually active, practice safe sex. Use a condom or other form of birth control (contraception) in order to prevent pregnancy and STIs (sexually transmitted infections). If you plan to become pregnant, see your health care provider for a pre-conception visit.  Find healthy ways to cope with stress, such as: ? Meditation, yoga, or listening to music. ? Journaling. ? Talking to a trusted person. ? Spending time with friends and family. Safety  Always wear your seat belt while driving or riding in a vehicle.  Do not drive if you have been drinking alcohol. Do not ride with someone who has been drinking.  Do not drive when you are tired or distracted. Do not text while driving.  Wear a helmet and other protective equipment during sports activities.  If you have firearms in your house, make sure you follow all gun safety procedures.  Seek help if you have been bullied, physically abused, or sexually abused.  Use the Internet responsibly to avoid dangers such as online bullying and online sex predators. What's next?  Go to your health care provider once a year for a well check visit.  Ask your health care provider how often you should have your eyes and teeth checked.  Stay up to date on all vaccines. This information is not intended to replace advice given to you by  your health care provider. Make sure you discuss any questions you have with your health care provider. Document Revised: 09/30/2018 Document Reviewed: 09/30/2018 Elsevier Patient Education  2020 [employer].

## 2020-09-19 DIAGNOSIS — Z419 Encounter for procedure for purposes other than remedying health state, unspecified: Secondary | ICD-10-CM | POA: Diagnosis not present

## 2020-09-19 LAB — CBC WITH DIFFERENTIAL/PLATELET
Absolute Monocytes: 533 cells/uL (ref 200–950)
Basophils Absolute: 29 cells/uL (ref 0–200)
Basophils Relative: 0.4 %
Eosinophils Absolute: 131 cells/uL (ref 15–500)
Eosinophils Relative: 1.8 %
HCT: 42.4 % (ref 35.0–45.0)
Hemoglobin: 14.4 g/dL (ref 11.7–15.5)
Lymphs Abs: 2321 cells/uL (ref 850–3900)
MCH: 30.1 pg (ref 27.0–33.0)
MCHC: 34 g/dL (ref 32.0–36.0)
MCV: 88.5 fL (ref 80.0–100.0)
MPV: 10 fL (ref 7.5–12.5)
Monocytes Relative: 7.3 %
Neutro Abs: 4285 cells/uL (ref 1500–7800)
Neutrophils Relative %: 58.7 %
Platelets: 242 10*3/uL (ref 140–400)
RBC: 4.79 10*6/uL (ref 3.80–5.10)
RDW: 11.8 % (ref 11.0–15.0)
Total Lymphocyte: 31.8 %
WBC: 7.3 10*3/uL (ref 3.8–10.8)

## 2020-09-19 LAB — HEPATITIS C ANTIBODY
Hepatitis C Ab: NONREACTIVE
SIGNAL TO CUT-OFF: 0.01 (ref <1.00)

## 2020-09-19 LAB — COMPLETE METABOLIC PANEL WITH GFR
AG Ratio: 2 (calc) (ref 1.0–2.5)
ALT: 27 U/L (ref 6–29)
AST: 19 U/L (ref 10–30)
Albumin: 4.6 g/dL (ref 3.6–5.1)
Alkaline phosphatase (APISO): 66 U/L (ref 31–125)
BUN: 9 mg/dL (ref 7–25)
CO2: 23 mmol/L (ref 20–32)
Calcium: 9.7 mg/dL (ref 8.6–10.2)
Chloride: 106 mmol/L (ref 98–110)
Creat: 0.68 mg/dL (ref 0.50–1.10)
GFR, Est African American: 145 mL/min/{1.73_m2} (ref >=60)
GFR, Est Non African American: 125 mL/min/{1.73_m2} (ref >=60)
Globulin: 2.3 g/dL (calc) (ref 1.9–3.7)
Glucose, Bld: 68 mg/dL (ref 65–99)
Potassium: 4.2 mmol/L (ref 3.5–5.3)
Sodium: 140 mmol/L (ref 135–146)
Total Bilirubin: 0.4 mg/dL (ref 0.2–1.2)
Total Protein: 6.9 g/dL (ref 6.1–8.1)

## 2020-10-20 ENCOUNTER — Encounter: Payer: Self-pay | Admitting: Family Medicine

## 2020-10-20 DIAGNOSIS — Z419 Encounter for procedure for purposes other than remedying health state, unspecified: Secondary | ICD-10-CM | POA: Diagnosis not present

## 2020-10-21 ENCOUNTER — Encounter: Payer: Self-pay | Admitting: Emergency Medicine

## 2020-10-21 ENCOUNTER — Other Ambulatory Visit: Payer: Self-pay

## 2020-10-21 ENCOUNTER — Emergency Department
Admission: EM | Admit: 2020-10-21 | Discharge: 2020-10-21 | Disposition: A | Discharge status: Home / Self Care | Payer: Medicaid Other | Attending: Emergency Medicine | Admitting: Emergency Medicine

## 2020-10-21 DIAGNOSIS — R197 Diarrhea, unspecified: Secondary | ICD-10-CM | POA: Diagnosis not present

## 2020-10-21 DIAGNOSIS — R1031 Right lower quadrant pain: Secondary | ICD-10-CM | POA: Diagnosis not present

## 2020-10-21 DIAGNOSIS — R110 Nausea: Secondary | ICD-10-CM | POA: Diagnosis not present

## 2020-10-21 DIAGNOSIS — Z5321 Procedure and treatment not carried out due to patient leaving prior to being seen by health care provider: Secondary | ICD-10-CM | POA: Insufficient documentation

## 2020-10-21 LAB — COMPREHENSIVE METABOLIC PANEL
ALT: 46 U/L — ABNORMAL HIGH (ref 0–44)
AST: 30 U/L (ref 15–41)
Albumin: 4.5 g/dL (ref 3.5–5.0)
Alkaline Phosphatase: 70 U/L (ref 38–126)
Anion gap: 12 (ref 5–15)
BUN: 13 mg/dL (ref 6–20)
CO2: 21 mmol/L — ABNORMAL LOW (ref 22–32)
Calcium: 9.5 mg/dL (ref 8.9–10.3)
Chloride: 102 mmol/L (ref 98–111)
Creatinine, Ser: 0.61 mg/dL (ref 0.44–1.00)
GFR, Estimated: >60 mL/min (ref >60)
Glucose, Bld: 85 mg/dL (ref 70–99)
Potassium: 3.8 mmol/L (ref 3.5–5.1)
Sodium: 135 mmol/L (ref 135–145)
Total Bilirubin: 0.6 mg/dL (ref 0.3–1.2)
Total Protein: 7.5 g/dL (ref 6.5–8.1)

## 2020-10-21 LAB — URINALYSIS, COMPLETE (UACMP) WITH MICROSCOPIC
Bacteria, UA: NONE SEEN
Bilirubin Urine: NEGATIVE
Glucose, UA: NEGATIVE mg/dL
Hgb urine dipstick: NEGATIVE
Ketones, ur: NEGATIVE mg/dL
Leukocytes,Ua: NEGATIVE
Nitrite: NEGATIVE
Protein, ur: NEGATIVE mg/dL
Specific Gravity, Urine: 1.015 (ref 1.005–1.030)
pH: 5 (ref 5.0–8.0)

## 2020-10-21 LAB — CBC
HCT: 43.5 % (ref 36.0–46.0)
Hemoglobin: 14.8 g/dL (ref 12.0–15.0)
MCH: 30.7 pg (ref 26.0–34.0)
MCHC: 34 g/dL (ref 30.0–36.0)
MCV: 90.2 fL (ref 80.0–100.0)
Platelets: 202 10*3/uL (ref 150–400)
RBC: 4.82 MIL/uL (ref 3.87–5.11)
RDW: 12.3 % (ref 11.5–15.5)
WBC: 6.1 10*3/uL (ref 4.0–10.5)
nRBC: 0 % (ref 0.0–0.2)

## 2020-10-21 LAB — POC URINE PREG, ED: Preg Test, Ur: NEGATIVE

## 2020-10-21 LAB — LIPASE, BLOOD: Lipase: 23 U/L (ref 11–51)

## 2020-10-21 NOTE — ED Triage Notes (Signed)
Pt to ED via POV stating that she has been having abdominal pain for the past few days. Pain started around her naval and has moved to the RLQ. Pt states that she is also having diarrhea and nausea as well. Pt is in NAD.

## 2020-10-21 NOTE — ED Notes (Signed)
No answer in lobby for repeat vitals  

## 2020-10-22 ENCOUNTER — Encounter: Payer: Self-pay | Admitting: Emergency Medicine

## 2020-10-22 ENCOUNTER — Other Ambulatory Visit: Payer: Self-pay

## 2020-10-22 ENCOUNTER — Emergency Department: Payer: Medicaid Other

## 2020-10-22 ENCOUNTER — Emergency Department
Admission: EM | Admit: 2020-10-22 | Discharge: 2020-10-22 | Disposition: A | Discharge status: Home / Self Care | Payer: Medicaid Other | Attending: Student in an Organized Health Care Education/Training Program | Admitting: Student in an Organized Health Care Education/Training Program

## 2020-10-22 ENCOUNTER — Ambulatory Visit: Payer: Self-pay | Admitting: *Deleted

## 2020-10-22 DIAGNOSIS — R1031 Right lower quadrant pain: Secondary | ICD-10-CM | POA: Diagnosis not present

## 2020-10-22 DIAGNOSIS — Z20822 Contact with and (suspected) exposure to covid-19: Secondary | ICD-10-CM | POA: Diagnosis not present

## 2020-10-22 DIAGNOSIS — N3090 Cystitis, unspecified without hematuria: Secondary | ICD-10-CM | POA: Insufficient documentation

## 2020-10-22 DIAGNOSIS — Z87891 Personal history of nicotine dependence: Secondary | ICD-10-CM | POA: Insufficient documentation

## 2020-10-22 DIAGNOSIS — R109 Unspecified abdominal pain: Secondary | ICD-10-CM | POA: Diagnosis not present

## 2020-10-22 LAB — COMPREHENSIVE METABOLIC PANEL
ALT: 36 U/L (ref 0–44)
AST: 27 U/L (ref 15–41)
Albumin: 4.4 g/dL (ref 3.5–5.0)
Alkaline Phosphatase: 67 U/L (ref 38–126)
Anion gap: 8 (ref 5–15)
BUN: 11 mg/dL (ref 6–20)
CO2: 24 mmol/L (ref 22–32)
Calcium: 9.4 mg/dL (ref 8.9–10.3)
Chloride: 106 mmol/L (ref 98–111)
Creatinine, Ser: 0.62 mg/dL (ref 0.44–1.00)
GFR, Estimated: >60 mL/min (ref >60)
Glucose, Bld: 106 mg/dL — ABNORMAL HIGH (ref 70–99)
Potassium: 3.7 mmol/L (ref 3.5–5.1)
Sodium: 138 mmol/L (ref 135–145)
Total Bilirubin: 0.8 mg/dL (ref 0.3–1.2)
Total Protein: 7.4 g/dL (ref 6.5–8.1)

## 2020-10-22 LAB — CBC
HCT: 42.6 % (ref 36.0–46.0)
Hemoglobin: 14.3 g/dL (ref 12.0–15.0)
MCH: 31.1 pg (ref 26.0–34.0)
MCHC: 33.6 g/dL (ref 30.0–36.0)
MCV: 92.6 fL (ref 80.0–100.0)
Platelets: 213 10*3/uL (ref 150–400)
RBC: 4.6 MIL/uL (ref 3.87–5.11)
RDW: 12.1 % (ref 11.5–15.5)
WBC: 6.1 10*3/uL (ref 4.0–10.5)
nRBC: 0 % (ref 0.0–0.2)

## 2020-10-22 LAB — URINALYSIS, COMPLETE (UACMP) WITH MICROSCOPIC
Bilirubin Urine: NEGATIVE
Glucose, UA: NEGATIVE mg/dL
Ketones, ur: NEGATIVE mg/dL
Nitrite: NEGATIVE
Protein, ur: NEGATIVE mg/dL
Specific Gravity, Urine: 1.024 (ref 1.005–1.030)
pH: 5 (ref 5.0–8.0)

## 2020-10-22 LAB — RESP PANEL BY RT-PCR (FLU A&B, COVID) ARPGX2
Influenza A by PCR: NEGATIVE
Influenza B by PCR: NEGATIVE
SARS Coronavirus 2 by RT PCR: NEGATIVE

## 2020-10-22 LAB — LIPASE, BLOOD: Lipase: 27 U/L (ref 11–51)

## 2020-10-22 LAB — PREGNANCY, URINE: Preg Test, Ur: NEGATIVE

## 2020-10-22 LAB — HCG, QUANTITATIVE, PREGNANCY: hCG, Beta Chain, Quant, S: <1 m[IU]/mL (ref <5)

## 2020-10-22 MED ORDER — CEPHALEXIN 500 MG PO CAPS: 500.0000 mg | ORAL_CAPSULE | Freq: Three times a day (TID) | ORAL | 0 refills | Status: AC | PRN

## 2020-10-22 MED ORDER — IOHEXOL 300 MG/ML  SOLN
100.0000 mL | Freq: Once | INTRAMUSCULAR | Status: AC | PRN
  Administered 2020-10-22: 100 mL via INTRAVENOUS
  Filled 2020-10-22: qty 100

## 2020-10-22 NOTE — Discharge Instructions (Addendum)
You had some bacteria and leukocytes in your urine so I am giving you a prescription for antibiotics. If your abdominal pain worsens, please return to the emergency department for an ultrasound of your ovaries.

## 2020-10-22 NOTE — ED Notes (Signed)
Lab called and informed to add on urine preg.  Lab verbalized understanding.

## 2020-10-22 NOTE — Telephone Encounter (Signed)
Patient reports she has abdominal pain that started 2 days ago- more consistent and worse. She did go to ED for 9 hours and she finally left.

## 2020-10-22 NOTE — Telephone Encounter (Signed)
Patient is calling to report she has had increasing abdominal pain RLQ for 4 days. Patient did go to ED and waited 9 hours before leaving. Patient is calling for appointment. Patient advised no open appointments today- UC per protocol.  Reason for Disposition . [1] MODERATE pain (e.g., interferes with normal activities) AND [2] pain comes and goes (cramps) AND [3] present > 24 hours  (Exception: pain with Vomiting or Diarrhea - see that Guideline)  Answer Assessment - Initial Assessment Questions 1. LOCATION: "Where does it hurt?"      R side below bellybutton area 2. RADIATION: "Does the pain shoot anywhere else?" (e.g., chest, back)     No radiation 3. ONSET: "When did the pain begin?" (e.g., minutes, hours or days ago)      2 days ago got worse- started as dull pain 2 days previously 4. SUDDEN: "Gradual or sudden onset?"     gradually 5. PATTERN "Does the pain come and go, or is it constant?"    - If constant: "Is it getting better, staying the same, or worsening?"      (Note: Constant means the pain never goes away completely; most serious pain is constant and it progresses)     - If intermittent: "How long does it last?" "Do you have pain now?"     (Note: Intermittent means the pain goes away completely between bouts)     Comes and goes- lasting- for a while for 30 minutes to 1 hour- patient feels pressure when gone- occurring at different points of days- activity makes worse 6. SEVERITY: "How bad is the pain?"  (e.g., Scale 1-10; mild, moderate, or severe)   - MILD (1-3): doesn't interfere with normal activities, abdomen soft and not tender to touch    - MODERATE (4-7): interferes with normal activities or awakens from sleep, tender to touch    - SEVERE (8-10): excruciating pain, doubled over, unable to do any normal activities      5-6 7. RECURRENT SYMPTOM: "Have you ever had this type of stomach pain before?" If Yes, ask: "When was the last time?" and "What happened that time?"       Yes- last time- constipation- nausea and diarrhea present 8. CAUSE: "What do you think is causing the stomach pain?"     ? Appendix- labs were drawn and urine sample 9. RELIEVING/AGGRAVATING FACTORS: "What makes it better or worse?" (e.g., movement, antacids, bowel movement)     Movement makes worse 10. OTHER SYMPTOMS: "Has there been any vomiting, diarrhea, constipation, or urine problems?"       Nausea and diarrhea with the pain 11. PREGNANCY: "Is there any chance you are pregnant?" "When was your last menstrual period?"       No- UPT test at hospital negative  Protocols used: ABDOMINAL PAIN - [facility]

## 2020-10-22 NOTE — ED Notes (Signed)
Ultrasound tech states pt refused to have [place] completed. Pt reported to tech that she goes to OB regularly and is due to go back next month. Morrie Sheldon PA made aware of pt refusal by this RN
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
## 2020-10-22 NOTE — ED Triage Notes (Signed)
Pt to ER with c/o RLQ abdominal pain for last 4 days.  Pt states pain is increasing.  Pt also reports nausea and diarrhea with pain as well.

## 2020-10-23 ENCOUNTER — Telehealth: Payer: Self-pay

## 2020-10-23 NOTE — Telephone Encounter (Signed)
Went to UC and was sent to ER. Was stated IBS flare up and UTI. Patient stated she is feeling better today

## 2020-10-23 NOTE — Telephone Encounter (Signed)
Transition Care Management Unsuccessful Follow-up Telephone Call  Date of discharge and from where:  10/22/2020 from [facility]   Attempts:  1st Attempt  Reason for unsuccessful TCM follow-up call:  Left voice message

## 2020-10-24 LAB — URINE CULTURE: Culture: <10000 — AB

## 2020-10-24 NOTE — Telephone Encounter (Signed)
Transition Care Management Follow-up Telephone Call  Date of discharge and from where: 10/22/2020 [facility] ED  How have you been since you were released from the hospital? Feeling a lot better, started antibiotic.   Any questions or concerns? No  Items Reviewed:  Did the pt receive and understand the discharge instructions provided? Yes   Medications obtained and verified? Yes   Other? No   Any new allergies since your discharge? No   Dietary orders reviewed? Yes  Do you have support at home? Yes   Functional Questionnaire: (I = Independent and D = Dependent) ADLs: I  Bathing/Dressing- I  Meal Prep- I  Eating- I  Maintaining continence- I  Transferring/Ambulation- I  Managing Meds- I  Follow up appointments reviewed:   PCP Hospital f/u appt confirmed? No  Patient confirmed Susan Berry, PA, stated she does not feel she needs to see her at this time, will finish dose of antibiotics given and go from there.   Specialist Hospital f/u appt confirmed? No    Are transportation arrangements needed? No   If their condition worsens, is the pt aware to call PCP or go to the Emergency Dept.? Yes  Was the patient provided with contact information for the PCP's office or ED? Yes  Was to pt encouraged to call back with questions or concerns? Yes

## 2020-11-06 NOTE — Telephone Encounter (Signed)
Patient went to [facility] and then sent to ED for further evaluation.

## 2020-11-20 DIAGNOSIS — Z419 Encounter for procedure for purposes other than remedying health state, unspecified: Secondary | ICD-10-CM | POA: Diagnosis not present

## 2020-12-18 DIAGNOSIS — Z419 Encounter for procedure for purposes other than remedying health state, unspecified: Secondary | ICD-10-CM | POA: Diagnosis not present

## 2020-12-24 ENCOUNTER — Ambulatory Visit: Payer: Medicaid Other | Admitting: Obstetrics and Gynecology

## 2021-01-08 ENCOUNTER — Ambulatory Visit (INDEPENDENT_AMBULATORY_CARE_PROVIDER_SITE_OTHER): Payer: Medicaid Other | Admitting: Obstetrics and Gynecology

## 2021-01-08 ENCOUNTER — Other Ambulatory Visit: Payer: Self-pay

## 2021-01-08 ENCOUNTER — Encounter: Payer: Self-pay | Admitting: Obstetrics and Gynecology

## 2021-01-08 ENCOUNTER — Other Ambulatory Visit (HOSPITAL_COMMUNITY)
Admission: RE | Admit: 2021-01-08 | Discharge: 2021-01-08 | Disposition: A | Discharge status: Home / Self Care | Payer: Medicaid Other | Source: Ambulatory Visit | Attending: Obstetrics and Gynecology | Admitting: Obstetrics and Gynecology

## 2021-01-08 VITALS — BP 120/70 | Ht 64.0 in | Wt 181.6 lb

## 2021-01-08 DIAGNOSIS — Z113 Encounter for screening for infections with a predominantly sexual mode of transmission: Secondary | ICD-10-CM

## 2021-01-08 DIAGNOSIS — Z30431 Encounter for routine checking of intrauterine contraceptive device: Secondary | ICD-10-CM | POA: Diagnosis not present

## 2021-01-08 DIAGNOSIS — Z0000 Encounter for general adult medical examination without abnormal findings: Secondary | ICD-10-CM

## 2021-01-08 DIAGNOSIS — Z01419 Encounter for gynecological examination (general) (routine) without abnormal findings: Secondary | ICD-10-CM | POA: Diagnosis not present

## 2021-01-08 NOTE — Patient Instructions (Signed)
Institute of Medicine Recommended Dietary Allowances for Calcium and Vitamin D  Age (yr) Calcium Recommended Dietary Allowance (mg/day) Vitamin D Recommended Dietary Allowance (international units/day)  9-18 1,300 600  19-50 1,000 600  51-70 1,200 600  71 and older 1,200 800  Data from Institute of Medicine. Dietary reference intakes: calcium, vitamin D. [place], [place]: National Academies Press; 2011.    Exercising to Stay Healthy To become healthy and stay healthy, it is recommended that you do moderate-intensity and vigorous-intensity exercise. You can tell that you are exercising at a moderate intensity if your heart starts beating faster and you start breathing faster but can still hold a conversation. You can tell that you are exercising at a vigorous intensity if you are breathing much harder and faster and cannot hold a conversation while exercising. Exercising regularly is important. It has many health benefits, such as:  Improving overall fitness, flexibility, and endurance.  Increasing bone density.  Helping with weight control.  Decreasing body fat.  Increasing muscle strength.  Reducing stress and tension.  Improving overall health. How often should I exercise? Choose an activity that you enjoy, and set realistic goals. Your health care provider can help you make an activity plan that works for you. Exercise regularly as told by your health care provider. This may include:  Doing strength training two times a week, such as: ? Lifting weights. ? Using resistance bands. ? Push-ups. ? Sit-ups. ? Yoga.  Doing a certain intensity of exercise for a given amount of time. Choose from these options: ? A total of 150 minutes of moderate-intensity exercise every week. ? A total of 75 minutes of vigorous-intensity exercise every week. ? A mix of moderate-intensity and vigorous-intensity exercise every week. Children, pregnant women, people who have not exercised  regularly, people who are overweight, and older adults may need to talk with a health care provider about what activities are safe to do. If you have a medical condition, be sure to talk with your health care provider before you start a new exercise program. What are some exercise ideas? Moderate-intensity exercise ideas include:  Walking 1 mile (1.6 km) in about 15 minutes.  Biking.  Hiking.  Golfing.  Dancing.  Water aerobics. Vigorous-intensity exercise ideas include:  Walking 4.5 miles (7.2 km) or more in about 1 hour.  Jogging or running 5 miles (8 km) in about 1 hour.  Biking 10 miles (16.1 km) or more in about 1 hour.  Lap swimming.  Roller-skating or in-line skating.  Cross-country skiing.  Vigorous competitive sports, such as football, basketball, and soccer.  Jumping rope.  Aerobic dancing.   What are some everyday activities that can help me to get exercise?  Yard work, such as: ? Pushing a lawn mower. ? Raking and bagging leaves.  Washing your car.  Pushing a stroller.  Shoveling snow.  Gardening.  Washing windows or floors. How can I be more active in my day-to-day activities?  Use stairs instead of an elevator.  Take a walk during your lunch break.  If you drive, park your car farther away from your work or school.  If you take public transportation, get off one stop early and walk the rest of the way.  Stand up or walk around during all of your indoor phone calls.  Get up, stretch, and walk around every 30 minutes throughout the day.  Enjoy exercise with a friend. Support to continue exercising will help you keep a regular routine of activity. What guidelines   can I follow while exercising?  Before you start a new exercise program, talk with your health care provider.  Do not exercise so much that you hurt yourself, feel dizzy, or get very short of breath.  Wear comfortable clothes and wear shoes with good support.  Drink plenty of  water while you exercise to prevent dehydration or heat stroke.  Work out until your breathing and your heartbeat get faster. Where to find more information  [place] Department of Health and Human Services: www.hhs.gov  Centers for Disease Control and Prevention (CDC): www.cdc.gov Summary  Exercising regularly is important. It will improve your overall fitness, flexibility, and endurance.  Regular exercise also will improve your overall health. It can help you control your weight, reduce stress, and improve your bone density.  Do not exercise so much that you hurt yourself, feel dizzy, or get very short of breath.  Before you start a new exercise program, talk with your health care provider. This information is not intended to replace advice given to you by your health care provider. Make sure you discuss any questions you have with your health care provider. Document Revised: 09/18/2017 Document Reviewed: 08/27/2017 Elsevier Patient Education  2021 Elsevier Inc.   Budget-Friendly Healthy Eating There are many ways to save money at the grocery store and continue to eat healthy. You can be successful if you:  Plan meals according to your budget.  Make a grocery list and only purchase food according to your grocery list.  Prepare food yourself at home. What are tips for following this plan? Reading food labels  Compare food labels between brand name foods and the store brand. Often the nutritional value is the same, but the store brand is lower cost.  Look for products that do not have added sugar, fat, or salt (sodium). These often cost the same but are healthier for you. Products may be labeled as: ? Sugar-free. ? Nonfat. ? Low-fat. ? Sodium-free. ? Low-sodium.  Look for lean ground beef labeled as at least 92% lean and 8% fat. Shopping  Buy only the items on your grocery list and go only to the areas of the store that have the items on your list.  Use coupons only for  foods and brands you normally buy. Avoid buying items you wouldn't normally buy simply because they are on sale.  Check online and in newspapers for weekly deals.  Buy healthy items from the bulk bins when available, such as herbs, spices, flour, pasta, nuts, and dried fruit.  Buy fruits and vegetables that are in season. Prices are usually lower on in-season produce.  Look at the unit price on the price tag. Use it to compare different brands and sizes to find out which item is the best deal.  Choose healthy items that are often low-cost, such as carrots, potatoes, apples, bananas, and oranges. Dried or canned beans are a low-cost protein source.  Buy in bulk and freeze extra food. Items you can buy in bulk include meats, fish, poultry, frozen fruits, and frozen vegetables.  Avoid buying "ready-to-eat" foods, such as pre-cut fruits and vegetables and pre-made salads.  If possible, shop around to discover where you can find the best prices. Consider other retailers such as dollar stores, larger wholesale stores, local fruit and vegetable stands, and farmers markets.  Do not shop when you are hungry. If you shop while hungry, it may be hard to stick to your list and budget.  Resist impulse buying. Use your grocery   list as your official plan for the week.  Buy a variety of vegetables and fruits by purchasing fresh, frozen, and canned items.  Look at the top and bottom shelves for deals. Foods at eye level (eye level of an adult or child) are usually more expensive.  Be efficient with your time when shopping. The more time you spend at the store, the more money you are likely to spend.  To save money when choosing more expensive foods like meats and dairy: ? Choose cheaper cuts of meat, such as bone-in chicken thighs and drumsticks instead of skinless and boneless chicken. When you are ready to prepare the chicken, you can remove the skin yourself to make it healthier. ? Choose lean meats  like chicken or turkey instead of beef. ? Choose canned seafood, such as tuna, salmon, or sardines. ? Buy eggs as a low-cost source of protein. ? Buy dried beans and peas, such as lentils, split peas, or kidney beans instead of meats. Dried beans and peas are a good alternative source of protein. ? Buy the larger tubs of yogurt instead of individual-sized containers.  Choose water instead of sodas and other sweetened beverages.  Avoid buying chips, cookies, and other "junk food." These items are usually expensive and not healthy.   Cooking  Make extra food and freeze the extras in meal-sized containers or in individual portions for fast meals and snacks.  Pre-cook on days when you have extra time to prepare meals in advance. You can keep these meals in the fridge or freezer and reheat for a quick meal.  When you come home from the grocery store, wash, peel, and cut fruits and vegetables so they are ready to use and eat. This will help reduce food waste. Meal planning  Do not eat out or get fast food. Prepare food at home.  Make a grocery list and make sure to bring it with you to the store. If you have a smart phone, you could use your phone to create your shopping list.  Plan meals and snacks according to a grocery list and budget you create.  Use leftovers in your meal plan for the week.  Look for recipes where you can cook once and make enough food for two meals.  Prepare budget-friendly types of meals like stews, casseroles, and stir-fry dishes.  Try some meatless meals or try "no cook" meals like salads.  Make sure that half your plate is filled with fruits or vegetables. Choose from fresh, frozen, or canned fruits and vegetables. If eating canned, remember to rinse them before eating. This will remove any excess salt added for packaging. Summary  Eating healthy on a budget is possible if you plan your meals according to your budget, purchase according to your budget and  grocery list, and prepare food yourself.  Tips for buying more food on a limited budget include buying generic brands, using coupons only for foods you normally buy, and buying healthy items from the bulk bins when available.  Tips for buying cheaper food to replace expensive food include choosing cheaper, lean cuts of meat, and buying dried beans and peas. This information is not intended to replace advice given to you by your health care provider. Make sure you discuss any questions you have with your health care provider. Document Revised: 07/19/2020 Document Reviewed: 07/19/2020 Elsevier Patient Education  2021 Elsevier Inc.   Bone Health Bones protect organs, store calcium, anchor muscles, and support the whole body. Keeping your bones   strong is important, especially as you get older. You can take actions to help keep your bones strong and healthy. Why is keeping my bones healthy important? Keeping your bones healthy is important because your body constantly replaces bone cells. Cells get old, and new cells take their place. As we age, we lose bone cells because the body may not be able to make enough new cells to replace the old cells. The amount of bone cells and bone tissue you have is referred to as bone mass. The higher your bone mass, the stronger your bones. The aging process leads to an overall loss of bone mass in the body, which can increase the likelihood of:  Joint pain and stiffness.  Broken bones.  A condition in which the bones become weak and brittle (osteoporosis). A large decline in bone mass occurs in older adults. In women, it occurs about the time of menopause.   What actions can I take to keep my bones healthy? Good health habits are important for maintaining healthy bones. This includes eating nutritious foods and exercising regularly. To have healthy bones, you need to get enough of the right minerals and vitamins. Most nutrition experts recommend getting these  nutrients from the foods that you eat. In some cases, taking supplements may also be recommended. Doing certain types of exercise is also important for bone health. What are the nutritional recommendations for healthy bones? Eating a well-balanced diet with plenty of calcium and vitamin D will help to protect your bones. Nutritional recommendations vary from person to person. Ask your health care provider what is healthy for you. Here are some general guidelines. Get enough calcium Calcium is the most important (essential) mineral for bone health. Most people can get enough calcium from their diet, but supplements may be recommended for people who are at risk for osteoporosis. Good sources of calcium include:  Dairy products, such as low-fat or nonfat milk, cheese, and yogurt.  Dark green leafy vegetables, such as bok choy and broccoli.  Calcium-fortified foods, such as orange juice, cereal, bread, soy beverages, and tofu products.  Nuts, such as almonds. Follow these recommended amounts for daily calcium intake:  Children, age 1-3: 700 mg.  Children, age 4-8: 1,000 mg.  Children, age 9-13: 1,300 mg.  Teens, age 14-18: 1,300 mg.  Adults, age 19-50: 1,000 mg.  Adults, age 51-70: ? Men: 1,000 mg. ? Women: 1,200 mg.  Adults, age 71 or older: 1,200 mg.  Pregnant and breastfeeding females: ? Teens: 1,300 mg. ? Adults: 1,000 mg. Get enough vitamin D Vitamin D is the most essential vitamin for bone health. It helps the body absorb calcium. Sunlight stimulates the skin to make vitamin D, so be sure to get enough sunlight. If you live in a cold climate or you do not get outside often, your health care provider may recommend that you take vitamin D supplements. Good sources of vitamin D in your diet include:  Egg yolks.  Saltwater fish.  Milk and cereal fortified with vitamin D. Follow these recommended amounts for daily vitamin D intake:  Children and teens, age 1-18: 600  international units.  Adults, age 50 or younger: 400-800 international units.  Adults, age 51 or older: 800-1,000 international units. Get other important nutrients Other nutrients that are important for bone health include:  Phosphorus. This mineral is found in meat, poultry, dairy foods, nuts, and legumes. The recommended daily intake for adult men and adult women is 700 mg.  Magnesium. This mineral   is found in seeds, nuts, dark green vegetables, and legumes. The recommended daily intake for adult men is 400-420 mg. For adult women, it is 310-320 mg.  Vitamin K. This vitamin is found in green leafy vegetables. The recommended daily intake is 120 mg for adult men and 90 mg for adult women.   What type of physical activity is best for building and maintaining healthy bones? Weight-bearing and strength-building activities are important for building and maintaining healthy bones. Weight-bearing activities cause muscles and bones to work against gravity. Strength-building activities increase the strength of the muscles that support bones. Weight-bearing and muscle-building activities include:  Walking and hiking.  Jogging and running.  Dancing.  Gym exercises.  Lifting weights.  Tennis and racquetball.  Climbing stairs.  Aerobics. Adults should get at least 30 minutes of moderate physical activity on most days. Children should get at least 60 minutes of moderate physical activity on most days. Ask your health care provider what type of exercise is best for you.   How can I find out if my bone mass is low? Bone mass can be measured with an X-ray test called a bone mineral density (BMD) test. This test is recommended for all women who are age 65 or older. It may also be recommended for:  Men who are age 70 or older.  People who are at risk for osteoporosis because of: ? Having bones that break easily. ? Having a long-term disease that weakens bones, such as kidney disease or  rheumatoid arthritis. ? Having menopause earlier than normal. ? Taking medicine that weakens bones, such as steroids, thyroid hormones, or hormone treatment for breast cancer or prostate cancer. ? Smoking. ? Drinking three or more alcoholic drinks a day. If you find that you have a low bone mass, you may be able to prevent osteoporosis or further bone loss by changing your diet and lifestyle. Where can I find more information? For more information, check out the following websites:  National Osteoporosis Foundation: www.nof.org/patients  National Institutes of Health: www.bones.nih.gov  International Osteoporosis Foundation: www.iofbonehealth.org Summary  The aging process leads to an overall loss of bone mass in the body, which can increase the likelihood of broken bones and osteoporosis.  Eating a well-balanced diet with plenty of calcium and vitamin D will help to protect your bones.  Weight-bearing and strength-building activities are also important for building and maintaining strong bones.  Bone mass can be measured with an X-ray test called a bone mineral density (BMD) test. This information is not intended to replace advice given to you by your health care provider. Make sure you discuss any questions you have with your health care provider. Document Revised: 11/02/2017 Document Reviewed: 11/02/2017 Elsevier Patient Education  2021 Elsevier Inc.   
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
## 2021-01-09 LAB — CERVICOVAGINAL ANCILLARY ONLY
Chlamydia: NEGATIVE
Comment: NEGATIVE
Comment: NEGATIVE
Comment: NORMAL
Neisseria Gonorrhea: NEGATIVE
Trichomonas: NEGATIVE

## 2021-01-18 DIAGNOSIS — Z419 Encounter for procedure for purposes other than remedying health state, unspecified: Secondary | ICD-10-CM | POA: Diagnosis not present

## 2021-01-21 ENCOUNTER — Emergency Department
Admission: EM | Admit: 2021-01-21 | Discharge: 2021-01-21 | Disposition: A | Discharge status: Home / Self Care | Payer: Medicaid Other | Attending: Emergency Medicine | Admitting: Emergency Medicine

## 2021-01-21 ENCOUNTER — Emergency Department: Payer: Medicaid Other

## 2021-01-21 ENCOUNTER — Other Ambulatory Visit: Payer: Self-pay

## 2021-01-21 DIAGNOSIS — M25562 Pain in left knee: Secondary | ICD-10-CM | POA: Insufficient documentation

## 2021-01-21 DIAGNOSIS — Z87891 Personal history of nicotine dependence: Secondary | ICD-10-CM | POA: Diagnosis not present

## 2021-01-21 DIAGNOSIS — R600 Localized edema: Secondary | ICD-10-CM | POA: Diagnosis not present

## 2021-01-21 DIAGNOSIS — X509XXA Other and unspecified overexertion or strenuous movements or postures, initial encounter: Secondary | ICD-10-CM | POA: Insufficient documentation

## 2021-01-21 DIAGNOSIS — Y9344 Activity, trampolining: Secondary | ICD-10-CM | POA: Diagnosis not present

## 2021-01-21 DIAGNOSIS — M25462 Effusion, left knee: Secondary | ICD-10-CM | POA: Diagnosis not present

## 2021-01-21 MED ORDER — MELOXICAM 15 MG PO TABS: 15.0000 mg | ORAL_TABLET | Freq: Every day | ORAL | 2 refills | Status: DC

## 2021-01-21 NOTE — ED Triage Notes (Signed)
Pt states she was jumping on trampoline and felt a pop below her left knee, pt states she hasnt been able to put pressure on leg since.

## 2021-01-21 NOTE — ED Notes (Signed)
ED tech Eman completed knee immobilizer and crutches.
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
## 2021-01-21 NOTE — ED Notes (Signed)
Pt was jumping on trampoline and felt left knee pop and has not been able to bear weight since. Pt alert and oriented. Pt's left leg elevated, ice pack applied.

## 2021-01-21 NOTE — Discharge Instructions (Signed)
Take Meloxicam once daily for pain and inflammation.  

## 2021-01-22 ENCOUNTER — Telehealth: Payer: Self-pay

## 2021-01-22 NOTE — Telephone Encounter (Signed)
Transition Care Management Follow-up Telephone Call  Date of discharge and from where: 01/21/2021 from [facility]  How have you been since you were released from the hospital? Pt states that she if feeling okay but her knee does still hurt.   Any questions or concerns? No  Items Reviewed:  Did the pt receive and understand the discharge instructions provided? Yes   Medications obtained and verified? Yes   Other? No   Any new allergies since your discharge? No   Dietary orders reviewed? n/a  Do you have support at home? Yes   Functional Questionnaire: (I = Independent and D = Dependent) ADLs: I  Bathing/Dressing- I  Meal Prep- I  Eating- I  Maintaining continence- I  Transferring/Ambulation- I  Managing Meds- I   Follow up appointments reviewed:   PCP Hospital f/u appt confirmed? No    Specialist Hospital f/u appt confirmed? Yes  Scheduled to see Ortho on 01/23/21.  Are transportation arrangements needed? No   If their condition worsens, is the pt aware to call PCP or go to the Emergency Dept.? Yes  Was the patient provided with contact information for the PCP's office or ED? Yes  Was to pt encouraged to call back with questions or concerns? Yes .

## 2021-01-23 DIAGNOSIS — M25562 Pain in left knee: Secondary | ICD-10-CM | POA: Diagnosis not present

## 2021-01-23 DIAGNOSIS — S8392XA Sprain of unspecified site of left knee, initial encounter: Secondary | ICD-10-CM | POA: Diagnosis not present

## 2021-01-29 DIAGNOSIS — S83422A Sprain of lateral collateral ligament of left knee, initial encounter: Secondary | ICD-10-CM | POA: Diagnosis not present

## 2021-01-29 DIAGNOSIS — S83512A Sprain of anterior cruciate ligament of left knee, initial encounter: Secondary | ICD-10-CM | POA: Diagnosis not present

## 2021-02-05 DIAGNOSIS — M25562 Pain in left knee: Secondary | ICD-10-CM | POA: Diagnosis not present

## 2021-02-05 DIAGNOSIS — M25662 Stiffness of left knee, not elsewhere classified: Secondary | ICD-10-CM | POA: Diagnosis not present

## 2021-02-17 DIAGNOSIS — Z419 Encounter for procedure for purposes other than remedying health state, unspecified: Secondary | ICD-10-CM | POA: Diagnosis not present

## 2021-02-18 DIAGNOSIS — S83512A Sprain of anterior cruciate ligament of left knee, initial encounter: Secondary | ICD-10-CM | POA: Diagnosis not present

## 2021-02-28 ENCOUNTER — Other Ambulatory Visit: Payer: Self-pay

## 2021-02-28 ENCOUNTER — Encounter
Admission: RE | Admit: 2021-02-28 | Discharge: 2021-02-28 | Disposition: A | Discharge status: Home / Self Care | Payer: Medicaid Other | Source: Ambulatory Visit | Attending: Orthopedic Surgery | Admitting: Orthopedic Surgery

## 2021-02-28 NOTE — Patient Instructions (Signed)
Your procedure is scheduled on:  Tuesday 03/12/21.  Report to THE FIRST FLOOR REGISTRATION DESK IN THE MEDICAL MALL ON THE MORNING OF SURGERY FIRST, THEN YOU WILL CHECK IN AT THE SURGERY INFORMATION DESK LOCATED OUTSIDE THE SAME DAY SURGERY DEPARTMENT LOCATED ON 2ND FLOOR MEDICAL MALL ENTRANCE.  To find out your arrival time please call [phone] between 1PM - 3PM on Monday 03/11/21.   Remember: Instructions that are not followed completely may result in serious medical risk, up to and including death, or upon the discretion of your surgeon and anesthesiologist your surgery may need to be rescheduled.     __X__ 1. Do not eat food after midnight the night before your procedure.                 No gum chewing or hard candies. You may drink clear liquids up to 2 hours                 before you are scheduled to arrive for your surgery- DO NOT drink clear                 liquids within 2 hours of the start of your surgery.                 Clear Liquids include:  water, apple juice without pulp, clear carbohydrate                 drink such as Clearfast or Gatorade, Black Coffee or Tea (Do not add                 milk or creamer to coffee or tea).  __X__2.  On the morning of surgery brush your teeth with toothpaste and water, you may rinse your mouth with mouthwash if you wish.  Do not swallow any toothpaste or mouthwash.    __X__ 3.  No Alcohol for 24 hours before or after surgery.  __X__ 4.  Do Not Smoke or use e-cigarettes For 24 Hours Prior to Your Surgery.                 Do not use any chewable tobacco products for at least 6 hours prior to                 surgery.  __X__5.  Notify your doctor if there is any change in your medical condition      (cold, fever, infections).      Do NOT wear jewelry, make-up, hairpins, clips or nail polish. Do NOT wear lotions, powders, or perfumes.  Do NOT shave 48 hours prior to surgery. Men may shave face and neck. Do NOT bring valuables to the  hospital.     [facility] is not responsible for any belongings or valuables.   Contacts, dentures/partials or body piercings may not be worn into surgery. Bring a case for your contacts, glasses or hearing aids, a denture cup will be supplied.    Patients discharged the day of surgery will not be allowed to drive home.     __X__ Take these medicines the morning of surgery with A SIP OF WATER:     1. FLUoxetine (PROZAC)   2. loratadine (CLARITIN)      __X__ Use CHG Soap or Dial Soap wipes as directed.   __X__ Stop Anti-inflammatories 7 days before surgery such as Advil, Ibuprofen, Motrin, BC or Goodies Powder, Naprosyn, Naproxen, Aleve, Aspirin, Meloxicam. May take Tylenol if needed for pain or  discomfort.   __X__Do not start taking any new herbal supplements or vitamins prior to your procedure.     Wear comfortable clothing (specific to your surgery type) to the hospital.  Plan for stool softeners for home use; pain medications have a tendency to cause constipation. You can also help prevent constipation by eating foods high in fiber such as fruits and vegetables and drinking plenty of fluids as your diet allows.  After surgery, you can prevent lung complications by doing breathing exercises.Take deep breaths and cough every 1-2 hours. Your doctor may order a device called an Incentive Spirometer to help you take deep breaths.  Please call the Pre-Admissions Testing Department at [phone] if you have any questions about these instructions.

## 2021-03-01 ENCOUNTER — Inpatient Hospital Stay
Admission: RE | Admit: 2021-03-01 | Discharge: 2021-03-01 | Disposition: A | Discharge status: Home / Self Care | Payer: Medicaid Other | Source: Ambulatory Visit

## 2021-03-05 ENCOUNTER — Other Ambulatory Visit: Payer: Self-pay | Admitting: Orthopedic Surgery

## 2021-03-08 ENCOUNTER — Other Ambulatory Visit: Admission: RE | Admit: 2021-03-08 | Payer: Medicaid Other | Source: Ambulatory Visit

## 2021-03-12 ENCOUNTER — Ambulatory Visit: Payer: Medicaid Other | Admitting: Urgent Care

## 2021-03-12 ENCOUNTER — Other Ambulatory Visit: Payer: Self-pay

## 2021-03-12 ENCOUNTER — Encounter
Admission: RE | Disposition: A | Discharge status: Home / Self Care | Payer: Self-pay | Source: Home / Self Care | Attending: Orthopedic Surgery

## 2021-03-12 ENCOUNTER — Encounter: Payer: Self-pay | Admitting: Orthopedic Surgery

## 2021-03-12 ENCOUNTER — Ambulatory Visit
Admission: RE | Admit: 2021-03-12 | Discharge: 2021-03-12 | Disposition: A | Discharge status: Home / Self Care | Payer: Medicaid Other | Attending: Orthopedic Surgery | Admitting: Orthopedic Surgery

## 2021-03-12 ENCOUNTER — Ambulatory Visit: Payer: Medicaid Other

## 2021-03-12 DIAGNOSIS — X58XXXA Exposure to other specified factors, initial encounter: Secondary | ICD-10-CM | POA: Diagnosis not present

## 2021-03-12 DIAGNOSIS — Z87891 Personal history of nicotine dependence: Secondary | ICD-10-CM | POA: Diagnosis not present

## 2021-03-12 DIAGNOSIS — Z975 Presence of (intrauterine) contraceptive device: Secondary | ICD-10-CM | POA: Diagnosis not present

## 2021-03-12 DIAGNOSIS — Z793 Long term (current) use of hormonal contraceptives: Secondary | ICD-10-CM | POA: Diagnosis not present

## 2021-03-12 DIAGNOSIS — Z888 Allergy status to other drugs, medicaments and biological substances status: Secondary | ICD-10-CM | POA: Insufficient documentation

## 2021-03-12 DIAGNOSIS — S83512A Sprain of anterior cruciate ligament of left knee, initial encounter: Secondary | ICD-10-CM | POA: Insufficient documentation

## 2021-03-12 HISTORY — PX: KNEE ARTHROSCOPY WITH ANTERIOR CRUCIATE LIGAMENT (ACL) REPAIR WITH HAMSTRING GRAFT: SHX5645

## 2021-03-12 LAB — POCT PREGNANCY, URINE: Preg Test, Ur: NEGATIVE

## 2021-03-12 SURGERY — KNEE ARTHROSCOPY WITH ANTERIOR CRUCIATE LIGAMENT (ACL) REPAIR WITH HAMSTRING GRAFT
Anesthesia: General | Laterality: Left

## 2021-03-12 MED ORDER — PROMETHAZINE HCL 25 MG/ML IJ SOLN
INTRAMUSCULAR | Status: AC
  Administered 2021-03-12: 6.25 mg via INTRAVENOUS
  Filled 2021-03-12: qty 1

## 2021-03-12 MED ORDER — SODIUM CHLORIDE FLUSH 0.9 % IV SOLN
INTRAVENOUS | Status: AC
  Filled 2021-03-12: qty 10

## 2021-03-12 MED ORDER — CEFAZOLIN SODIUM-DEXTROSE 2-4 GM/100ML-% IV SOLN
INTRAVENOUS | Status: AC
  Filled 2021-03-12: qty 100

## 2021-03-12 MED ORDER — ACETAMINOPHEN 500 MG PO TABS
ORAL_TABLET | ORAL | Status: AC
  Administered 2021-03-12: 1000 mg via ORAL
  Filled 2021-03-12: qty 2

## 2021-03-12 MED ORDER — DEXMEDETOMIDINE (PRECEDEX) IN NS 20 MCG/5ML (4 MCG/ML) IV SYRINGE
PREFILLED_SYRINGE | INTRAVENOUS | Status: DC | PRN
  Administered 2021-03-12: 12 ug via INTRAVENOUS

## 2021-03-12 MED ORDER — CHLORHEXIDINE GLUCONATE CLOTH 2 % EX PADS
6.0000 | MEDICATED_PAD | Freq: Once | CUTANEOUS | Status: AC
  Administered 2021-03-12: 6 via TOPICAL

## 2021-03-12 MED ORDER — OXYCODONE HCL 5 MG PO TABS
5.0000 mg | ORAL_TABLET | Freq: Once | ORAL | Status: AC | PRN
  Administered 2021-03-12: 5 mg via ORAL

## 2021-03-12 MED ORDER — OXYCODONE HCL 5 MG PO TABS
ORAL_TABLET | ORAL | Status: AC
  Filled 2021-03-12: qty 1

## 2021-03-12 MED ORDER — LIDOCAINE HCL (PF) 1 % IJ SOLN
INTRAMUSCULAR | Status: AC
  Filled 2021-03-12: qty 30

## 2021-03-12 MED ORDER — CELECOXIB 200 MG PO CAPS
ORAL_CAPSULE | ORAL | Status: AC
  Administered 2021-03-12: 200 mg via ORAL
  Filled 2021-03-12: qty 1

## 2021-03-12 MED ORDER — DEXAMETHASONE SODIUM PHOSPHATE 10 MG/ML IJ SOLN
INTRAMUSCULAR | Status: DC | PRN
  Administered 2021-03-12: 10 mg via INTRAVENOUS

## 2021-03-12 MED ORDER — DEXAMETHASONE SODIUM PHOSPHATE 10 MG/ML IJ SOLN
INTRAMUSCULAR | Status: AC
  Filled 2021-03-12: qty 1

## 2021-03-12 MED ORDER — MIDAZOLAM HCL 2 MG/2ML IJ SOLN
INTRAMUSCULAR | Status: AC
  Filled 2021-03-12: qty 2

## 2021-03-12 MED ORDER — SUGAMMADEX SODIUM 200 MG/2ML IV SOLN
INTRAVENOUS | Status: DC | PRN
  Administered 2021-03-12: 200 mg via INTRAVENOUS

## 2021-03-12 MED ORDER — PROPOFOL 10 MG/ML IV BOLUS
INTRAVENOUS | Status: AC
  Filled 2021-03-12: qty 20

## 2021-03-12 MED ORDER — FENTANYL CITRATE (PF) 100 MCG/2ML IJ SOLN
INTRAMUSCULAR | Status: AC
  Filled 2021-03-12: qty 2

## 2021-03-12 MED ORDER — FAMOTIDINE 20 MG PO TABS
ORAL_TABLET | ORAL | Status: AC
  Administered 2021-03-12: 20 mg via ORAL
  Filled 2021-03-12: qty 1

## 2021-03-12 MED ORDER — CELECOXIB 200 MG PO CAPS: 200.0000 mg | ORAL_CAPSULE | ORAL | Status: AC

## 2021-03-12 MED ORDER — MEPERIDINE HCL 25 MG/ML IJ SOLN: 6.2500 mg | INTRAMUSCULAR | Status: DC | PRN

## 2021-03-12 MED ORDER — ROCURONIUM BROMIDE 10 MG/ML (PF) SYRINGE
PREFILLED_SYRINGE | INTRAVENOUS | Status: AC
  Filled 2021-03-12: qty 10

## 2021-03-12 MED ORDER — PROPOFOL 10 MG/ML IV BOLUS
INTRAVENOUS | Status: DC | PRN
  Administered 2021-03-12: 160 mg via INTRAVENOUS

## 2021-03-12 MED ORDER — OXYCODONE HCL 5 MG/5ML PO SOLN: 5.0000 mg | Freq: Once | ORAL | Status: AC | PRN

## 2021-03-12 MED ORDER — CEFAZOLIN SODIUM-DEXTROSE 2-4 GM/100ML-% IV SOLN
2.0000 g | INTRAVENOUS | Status: AC
  Administered 2021-03-12: 2 g via INTRAVENOUS

## 2021-03-12 MED ORDER — LIDOCAINE HCL 1 % IJ SOLN
INTRAMUSCULAR | Status: DC | PRN
  Administered 2021-03-12: 28 mL
  Administered 2021-03-12: 2 mL

## 2021-03-12 MED ORDER — FENTANYL CITRATE (PF) 100 MCG/2ML IJ SOLN
INTRAMUSCULAR | Status: DC | PRN
  Administered 2021-03-12 (×3): 50 ug via INTRAVENOUS
  Administered 2021-03-12 (×2): 25 ug via INTRAVENOUS

## 2021-03-12 MED ORDER — ASPIRIN EC 325 MG PO TBEC: 325.0000 mg | DELAYED_RELEASE_TABLET | Freq: Every day | ORAL | 0 refills | Status: DC

## 2021-03-12 MED ORDER — EPHEDRINE 5 MG/ML INJ
INTRAVENOUS | Status: AC
  Filled 2021-03-12: qty 10

## 2021-03-12 MED ORDER — ONDANSETRON HCL 4 MG/2ML IJ SOLN
INTRAMUSCULAR | Status: DC | PRN
  Administered 2021-03-12: 4 mg via INTRAVENOUS

## 2021-03-12 MED ORDER — PHENYLEPHRINE HCL (PRESSORS) 10 MG/ML IV SOLN
INTRAVENOUS | Status: AC
  Filled 2021-03-12: qty 1

## 2021-03-12 MED ORDER — ONDANSETRON HCL 4 MG/2ML IJ SOLN
INTRAMUSCULAR | Status: AC
  Filled 2021-03-12: qty 2

## 2021-03-12 MED ORDER — EPHEDRINE SULFATE 50 MG/ML IJ SOLN
INTRAMUSCULAR | Status: DC | PRN
  Administered 2021-03-12 (×2): 5 mg via INTRAVENOUS

## 2021-03-12 MED ORDER — LIDOCAINE HCL (PF) 2 % IJ SOLN
INTRAMUSCULAR | Status: AC
  Filled 2021-03-12: qty 5

## 2021-03-12 MED ORDER — BUPIVACAINE HCL (PF) 0.25 % IJ SOLN
INTRAMUSCULAR | Status: DC | PRN
  Administered 2021-03-12: 30 mL

## 2021-03-12 MED ORDER — CHLORHEXIDINE GLUCONATE 0.12 % MT SOLN: 15.0000 mL | Freq: Once | OROMUCOSAL | Status: AC

## 2021-03-12 MED ORDER — CHLORHEXIDINE GLUCONATE 0.12 % MT SOLN
OROMUCOSAL | Status: AC
  Administered 2021-03-12: 15 mL via OROMUCOSAL
  Filled 2021-03-12: qty 15

## 2021-03-12 MED ORDER — EPINEPHRINE PF 1 MG/ML IJ SOLN
INTRAMUSCULAR | Status: DC | PRN
  Administered 2021-03-12: 4 mg

## 2021-03-12 MED ORDER — ROCURONIUM BROMIDE 100 MG/10ML IV SOLN
INTRAVENOUS | Status: DC | PRN
  Administered 2021-03-12: 50 mg via INTRAVENOUS

## 2021-03-12 MED ORDER — ORAL CARE MOUTH RINSE: 15.0000 mL | Freq: Once | OROMUCOSAL | Status: AC

## 2021-03-12 MED ORDER — DEXMEDETOMIDINE (PRECEDEX) IN NS 20 MCG/5ML (4 MCG/ML) IV SYRINGE
PREFILLED_SYRINGE | INTRAVENOUS | Status: AC
  Filled 2021-03-12: qty 5

## 2021-03-12 MED ORDER — ONDANSETRON HCL 4 MG PO TABS: 4.0000 mg | ORAL_TABLET | Freq: Three times a day (TID) | ORAL | 0 refills | Status: DC | PRN

## 2021-03-12 MED ORDER — FAMOTIDINE 20 MG PO TABS: 20.0000 mg | ORAL_TABLET | Freq: Once | ORAL | Status: AC

## 2021-03-12 MED ORDER — FENTANYL CITRATE (PF) 100 MCG/2ML IJ SOLN
25.0000 ug | INTRAMUSCULAR | Status: DC | PRN
  Administered 2021-03-12: 25 ug via INTRAVENOUS

## 2021-03-12 MED ORDER — BUPIVACAINE HCL (PF) 0.25 % IJ SOLN
INTRAMUSCULAR | Status: AC
  Filled 2021-03-12: qty 30

## 2021-03-12 MED ORDER — LIDOCAINE HCL (CARDIAC) PF 100 MG/5ML IV SOSY
PREFILLED_SYRINGE | INTRAVENOUS | Status: DC | PRN
  Administered 2021-03-12: 80 mg via INTRAVENOUS

## 2021-03-12 MED ORDER — PROMETHAZINE HCL 25 MG/ML IJ SOLN: 6.2500 mg | INTRAMUSCULAR | Status: DC | PRN

## 2021-03-12 MED ORDER — ACETAMINOPHEN 500 MG PO TABS: 1000.0000 mg | ORAL_TABLET | ORAL | Status: AC

## 2021-03-12 MED ORDER — EPINEPHRINE PF 1 MG/ML IJ SOLN
INTRAMUSCULAR | Status: AC
  Filled 2021-03-12: qty 4

## 2021-03-12 MED ORDER — LACTATED RINGERS IV SOLN: INTRAVENOUS | Status: DC

## 2021-03-12 MED ORDER — FENTANYL CITRATE (PF) 100 MCG/2ML IJ SOLN
INTRAMUSCULAR | Status: AC
  Administered 2021-03-12: 25 ug via INTRAVENOUS
  Filled 2021-03-12: qty 2

## 2021-03-12 MED ORDER — GLYCOPYRROLATE 0.2 MG/ML IJ SOLN
INTRAMUSCULAR | Status: AC
  Filled 2021-03-12: qty 1

## 2021-03-12 MED ORDER — OXYCODONE HCL 5 MG PO TABS: 5.0000 mg | ORAL_TABLET | ORAL | 0 refills | Status: DC | PRN

## 2021-03-12 MED ORDER — MIDAZOLAM HCL 2 MG/2ML IJ SOLN
INTRAMUSCULAR | Status: DC | PRN
  Administered 2021-03-12: 2 mg via INTRAVENOUS

## 2021-03-12 SURGICAL SUPPLY — 91 items
ADAPTER IRRIG TUBE 2 SPIKE SOL (ADAPTER) ×4 IMPLANT
ADPR TBG 2 SPK PMP STRL ASCP (ADAPTER) ×2
ANCHOR BUTTON TIGHTROPE ACL RT (Orthopedic Implant) ×2 IMPLANT
BASIN GRAD PLASTIC 32OZ STRL (MISCELLANEOUS) ×2 IMPLANT
BIT DRILL PIN RETRO (DRILL) ×1 IMPLANT
BLADE SURG 15 STRL LF DISP TIS (BLADE) ×1 IMPLANT
BLADE SURG 15 STRL SS (BLADE) ×2
BLADE SURG SZ11 CARB STEEL (BLADE) ×2 IMPLANT
BNDG COHESIVE 4X5 TAN STRL (GAUZE/BANDAGES/DRESSINGS) ×2 IMPLANT
BNDG COHESIVE 6X5 TAN STRL LF (GAUZE/BANDAGES/DRESSINGS) ×2 IMPLANT
BNDG ESMARK 6X12 TAN STRL LF (GAUZE/BANDAGES/DRESSINGS) IMPLANT
BUR RADIUS 3.5 (BURR) ×2 IMPLANT
BUR RADIUS 4.0X18.5 (BURR) ×2 IMPLANT
BUR RADIUS 5.5 (BURR) ×2 IMPLANT
CLEANER CAUTERY TIP 5X5 PAD (MISCELLANEOUS) ×1 IMPLANT
COOLER POLAR GLACIER W/PUMP (MISCELLANEOUS) ×2 IMPLANT
COVER BACK TABLE REUSABLE LG (DRAPES) ×2 IMPLANT
COVER WAND RF STERILE (DRAPES) ×2 IMPLANT
CUFF TOURN SGL QUICK 24 (TOURNIQUET CUFF)
CUFF TOURN SGL QUICK 30 (TOURNIQUET CUFF) ×2
CUFF TRNQT CYL 24X4X16.5-23 (TOURNIQUET CUFF) IMPLANT
CUFF TRNQT CYL 30X4X21-28X (TOURNIQUET CUFF) ×1 IMPLANT
CUTTER 8.5 RETRO (CUTTER) ×2 IMPLANT
DRAPE 3/4 80X56 (DRAPES) ×4 IMPLANT
DRAPE FLUOR MINI C-ARM 54X84 (DRAPES) ×2 IMPLANT
DRAPE IMP U-DRAPE 54X76 (DRAPES) ×4 IMPLANT
DRAPE POUCH INSTRU U-SHP 10X18 (DRAPES) ×2 IMPLANT
DRAPE U-SHAPE 47X51 STRL (DRAPES) ×2 IMPLANT
DRILL FLIPCUTTER III 6-12 (ORTHOPEDIC DISPOSABLE SUPPLIES) ×1 IMPLANT
DRILL PIN RETRO (DRILL) ×2
DURAPREP 26ML APPLICATOR (WOUND CARE) ×6 IMPLANT
ELECT REM PT RETURN 9FT ADLT (ELECTROSURGICAL) ×2
ELECTRODE REM PT RTRN 9FT ADLT (ELECTROSURGICAL) ×1 IMPLANT
FLIPCUTTER III 6-12 AR-1204FF (ORTHOPEDIC DISPOSABLE SUPPLIES) ×2
GAUZE SPONGE 4X4 12PLY STRL (GAUZE/BANDAGES/DRESSINGS) ×2 IMPLANT
GAUZE XEROFORM 1X8 LF (GAUZE/BANDAGES/DRESSINGS) ×2 IMPLANT
GLOVE SURG 9.0 ORTHO LTXF (GLOVE) ×4 IMPLANT
GLOVE SURG UNDER POLY LF SZ9 (GLOVE) ×2 IMPLANT
GOWN STRL REUS TWL 2XL XL LVL4 (GOWN DISPOSABLE) ×2 IMPLANT
GOWN STRL REUS W/ TWL LRG LVL3 (GOWN DISPOSABLE) ×1 IMPLANT
GOWN STRL REUS W/TWL LRG LVL3 (GOWN DISPOSABLE) ×2
GRADUATE 1200CC STRL 31836 (MISCELLANEOUS) ×2 IMPLANT
GUIDEWIRE 1.2MMX18 (WIRE) ×2 IMPLANT
HANDLE YANKAUER SUCT BULB TIP (MISCELLANEOUS) ×2 IMPLANT
IMP SYS 2ND FIX PEEK 4.75X19.1 (Miscellaneous) ×4 IMPLANT
IMPL SYS 2ND FX PEEK 4.75X19.1 (Miscellaneous) ×2 IMPLANT
IV LACTATED RINGER IRRG 3000ML (IV SOLUTION) ×24
IV LR IRRIG 3000ML ARTHROMATIC (IV SOLUTION) ×12 IMPLANT
KIT TRANSTIBIAL (DISPOSABLE) ×2 IMPLANT
KIT TURNOVER KIT A (KITS) ×2 IMPLANT
LABEL OR SOLS (LABEL) ×2 IMPLANT
MANIFOLD NEPTUNE II (INSTRUMENTS) ×4 IMPLANT
MAT ABSORB  FLUID 56X50 GRAY (MISCELLANEOUS) ×2
MAT ABSORB FLUID 56X50 GRAY (MISCELLANEOUS) ×2 IMPLANT
NDL SAFETY ECLIPSE 18X1.5 (NEEDLE) ×1 IMPLANT
NEEDLE FILTER BLUNT 18X 1/2SAF (NEEDLE) ×1
NEEDLE FILTER BLUNT 18X1 1/2 (NEEDLE) ×1 IMPLANT
NEEDLE HYPO 18GX1.5 SHARP (NEEDLE) ×2
NEEDLE HYPO 22GX1.5 SAFETY (NEEDLE) ×2 IMPLANT
PACK ARTHROSCOPY KNEE (MISCELLANEOUS) ×2 IMPLANT
PAD ABD DERMACEA PRESS 5X9 (GAUZE/BANDAGES/DRESSINGS) ×4 IMPLANT
PAD CLEANER CAUTERY TIP 5X5 (MISCELLANEOUS) ×1
PAD WRAPON POLAR KNEE (MISCELLANEOUS) ×1 IMPLANT
PENCIL ELECTRO HAND CTR (MISCELLANEOUS) ×2 IMPLANT
SCREW BIOCOMP 9X28 (Screw) ×2 IMPLANT
SET TUBE SUCT SHAVER OUTFL 24K (TUBING) ×2 IMPLANT
SET TUBE TIP INTRA-ARTICULAR (MISCELLANEOUS) ×2 IMPLANT
SPONGE T-LAP 18X18 ~~LOC~~+RFID (SPONGE) ×2 IMPLANT
STRIP CLOSURE SKIN 1/2X4 (GAUZE/BANDAGES/DRESSINGS) ×4 IMPLANT
SUCTION FRAZIER HANDLE 10FR (MISCELLANEOUS) ×1
SUCTION TUBE FRAZIER 10FR DISP (MISCELLANEOUS) ×1 IMPLANT
SUT 2 FIBERLOOP 20 STRT BLUE (SUTURE) ×4
SUT ETHILON 4-0 (SUTURE) ×2
SUT ETHILON 4-0 FS2 18XMFL BLK (SUTURE) ×1
SUT FIBERSNARE 2 CLSD LOOP (SUTURE) ×2 IMPLANT
SUT FIBERWIRE #2 38 T-5 BLUE (SUTURE) ×4
SUT MNCRL AB 4-0 PS2 18 (SUTURE) ×2 IMPLANT
SUT VIC AB 0 CT1 36 (SUTURE) ×2 IMPLANT
SUT VIC AB 2-0 CT2 27 (SUTURE) ×2 IMPLANT
SUT VIC AB 2-0 SH 27 (SUTURE) ×2
SUT VIC AB 2-0 SH 27XBRD (SUTURE) ×1 IMPLANT
SUTURE 2 FIBERLOOP 20 STRT BLU (SUTURE) ×2 IMPLANT
SUTURE ETHLN 4-0 FS2 18XMF BLK (SUTURE) ×1 IMPLANT
SUTURE FIBERWR #2 38 T-5 BLUE (SUTURE) ×2 IMPLANT
SYR 10ML LL (SYRINGE) ×4 IMPLANT
SYR BULB IRRIG 60ML STRL (SYRINGE) ×2 IMPLANT
TAPE UMBILICAL 1/8 X36 TWILL (MISCELLANEOUS) ×2 IMPLANT
TOWEL OR 17X26 4PK STRL BLUE (TOWEL DISPOSABLE) ×4 IMPLANT
TUBING ARTHRO INFLOW-ONLY STRL (TUBING) ×2 IMPLANT
WAND WEREWOLF FLOW 90D (MISCELLANEOUS) ×2 IMPLANT
WRAPON POLAR PAD KNEE (MISCELLANEOUS) ×2
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
## 2021-03-12 NOTE — Anesthesia Procedure Notes (Signed)
Procedure Name: Intubation Date/Time: 03/12/2021 8:01 AM Performed by: Ginger Carne, CRNA Pre-anesthesia Checklist: Patient identified, Emergency Drugs available, Suction available, Patient being monitored and Timeout performed Patient Re-evaluated:Patient Re-evaluated prior to induction Oxygen Delivery Method: Circle system utilized Preoxygenation: Pre-oxygenation with 100% oxygen Induction Type: IV induction Ventilation: Mask ventilation without difficulty Laryngoscope Size: McGraph and 3 Grade View: Grade I Tube type: Oral Tube size: 6.5 mm Number of attempts: 1 Airway Equipment and Method: Stylet,  Video-laryngoscopy and Oral airway Placement Confirmation: positive ETCO2,  ETT inserted through vocal cords under direct vision and breath sounds checked- equal and bilateral Secured at: 21 cm Tube secured with: Tape Dental Injury: Teeth and Oropharynx as per pre-operative assessment

## 2021-03-12 NOTE — Op Note (Signed)
03/12/2021  11:25 AM  PATIENT:  Susan Klein    PRE-OPERATIVE DIAGNOSIS:  Left knee ACL tear  POST-OPERATIVE DIAGNOSIS:  Same  PROCEDURE:  LEFT KNEE ARTHROSCOPY WITH ANTERIOR CRUCIATE LIGAMENT (ACL) RECONSTRUCTION WITH HAMSTRING AUTOGRAFT  SURGEON:  Juanell Fairly, MD   ASSISTANT:  Cam Hai, MD  ANESTHESIA:   General  PREOPERATIVE INDICATIONS:  Susan Klein is a  25 y.o. female with a diagnosis of left ACL Tear confirmed by MRI who elected for surgical reconstruction of her torn left ACL.  Patient has been having persistent instability to her left knee.  The risks benefits and alternatives were discussed with the patient preoperatively including but not limited to the risks of infection, bleeding, nerve or blood vessel injury, knee stiffness/arthrofibrosis, hardware failure, re-tear of the anterior cruciate ligament graft, persistent pain or instability, osteoarthritis and the need for revision surgery.  Medical risks include but are not limited to DVT and pulmonary embolism, stroke, pneumonia, respiratory failure and death. Patient understood these risks and wished to proceed with surgical reconstruction.   OPERATIVE IMPLANTS: Arthrex anterior cruciate ligament tightrope RC, Arthrex biocomposite 9 x 28 mm tibial interference screw and two Arthrex Swivelock anchors for left backup tibial fixation  OPERATIVE FINDINGS: Full-thickness tear of the left ACL from its femoral origin.  Patient had no evidence of meniscal tears or focal chondral lesions.  OPERATIVE PROCEDURE: Patient was in the preoperative area. The left knee was marked with the word yes according the hospital's correct site of surgery protocol.  A preop history and physical was performed in the preoperative area.  I marked the left knee according the hospital's correct site of surgery protocol.  I reviewed the patient's labs and MRI in preparation for this case.    The patient was then brought to the operating room and  placed in the supine position. General anesthesia was administered.  She was given 2 g of Ancef IV for antibiotic prophylaxis. The lower extremity was prepped and draped in usual sterile fashion.   A time out was performed to verify the patient's name, date of birth, medical record number, correct site of surgery correct procedure to be performed. It was also used to verify the patient received antibiotics and all appropriate instruments, implants and radiographs studies were available in the room. Once all in attendance were in agreement case began. A tourniquet was applied to the left upper thigh but was not inflated.  Exam under anesthesia was performed which demonstrated anterior laxity on Lachman's and anterior drawer's testing of approximately 5 mm.  The patient had no instability to varus valgus stress testing at 0 and 30 of flexion. His knee range of motion was from 0 120. She did not have a significant effusion.   Proposed arthroscopy incisions were drawn out with a surgical marker and pre-injected with 1% lidocaine plain. An 11 blade was used to establish an inferior medial and lateral portals. The medial portal was created under direct visualization using an 18-gauge spinal needle for localization. A full diagnostic examination of the knee was performed including the suprapatellar pouch, the patella femoral joint, medial lateral gutters, the medial and lateral compartments, the intercondylar notch in the posterior knee. The anterior cruciate ligament fibers were debrided with a 5.5 resector shaver blade. A 5.5 resector shaver blade was used perform a notchplasty. Once the intercondylar notch was prepped the attention was turned to harvesting the hamstring autografts.  A longitudinal incision was made over the anteromedial proximal tibia. The sartorius  fascia was incised with a 15 blade and reflected to reveal the underlying gracilis and semitendinosus. These were harvested using a tendon  stripper. The grafts were prepared on the back table. The graft was measured to be 8 mm on the femoral side and 8.5 mm on the tibial side. The length of the graft was 230 mm. The graft was placed on the Graftmaster table under 15 mmHg of tension and kept moist on the back table until implantation.   The attention was then turned to tunnel creation. The femoral tunnel cutting guide was then placed through the lateral portal. The arthroscope was placed in the medial portal at this point. The intercondylar distance was measured at 35 mm at. A flip cutter drill guide was advanced into the intercondylar notch. The blade was engaged and the femoral tunnel was created in a retrograde fashion to 30 mm. A fiber stick suture was placed through the femoral tunnel brought out the lateral portal and clamped for later graft passage.   The attention was then turned to tibial tunnel creation. This was done with a fixed angle tibial retro-drill guide. A drill pin was inserted through the anterior tibia and advanced until it engaged the 8.5 mm drill bit. A tibial tunnel was then created in a retrograde fashion.  Sequential dilators up to 8.5 mm were used to ensure the graft would pass through the tibial tunnel.  The fiber stick was brought out through the tibial tunnel. The 4 stranded hamstring tibial autograft was then shuttled through the knee using the fiber stick graft. Once the button was flipped on the lateral femoral cortex FluoroScan image was taken to confirm it was laying flat against the lateral cortex of the femur. Once this was confirmed the hamstring graft was advanced into position using the white suture ends of the Arthrex tight rope RC button. The graft was bottomed out into the femoral tunnel. The knee was then cycled 25 times to remove creep. The knee was then flexed approximately 30. An Arthrex bio composite interference screw 9 x 28 mm was then advanced into position with countertraction on the tibial side  of the graft and a posterior drawer force directed to the tibia.  Backup fixation of the tibia was provided by 2 Arthrex swivel lock anchors.  The sutures from the tibial side of the graft were loaded equally into these 2 swivel lock anchors and secured to the anterior tibia via drill holes.  The patient had a firm endpoint without anterior laxity on Lachman's test the conclusion of the case. The range of motion was 0-120. Final arthroscopic images of the graft were taken. There was no graft impingement in full extension. The wounds were copiously irrigated. The deep fascia of the anterior tibial incision was closed with interrupted 0 Vicryl.  The of the tibial incision subcutaneous tissue was closed with a 2-0 Vicryl and the skin was approximated with a running 4-0 Monocryl. The arthroscopy portal incisions were closed with 4-0 nylon along with the small stab incision over the lateral femur used for placement of the femoral tunnel.  Patient had a dry sterile dressing applied along with Steri-Strips and Xeroform. The incisions and the joint were injected with mixture of 1% lidocaine plain and and 0.25% Marcaine plain.  Patient had a Polar Care sleeve along with a hinged knee brace locked in extension. The patient was brought to the PACU in stable condition. I was scrubbed and present the entire case and all sharp and instrument  counts were correct at conclusion the case. ..  The patient was awakened and brought to PACU in stable condition. I spoke with the patient's mother in the postop consultation room to let her know that patient was stable in recovery room the case had been performed without complication.

## 2021-03-12 NOTE — Anesthesia Postprocedure Evaluation (Signed)
Anesthesia Post Note  Patient: Susan Klein  Procedure(s) Performed: KNEE ARTHROSCOPY WITH ANTERIOR CRUCIATE LIGAMENT (ACL) REPAIR WITH HAMSTRING GRAFT (Left )  Patient location during evaluation: PACU Anesthesia Type: General Level of consciousness: awake and alert and oriented Pain management: pain level controlled Vital Signs Assessment: post-procedure vital signs reviewed and stable Respiratory status: spontaneous breathing, nonlabored ventilation and respiratory function stable Cardiovascular status: blood pressure returned to baseline and stable Postop Assessment: no signs of nausea or vomiting Anesthetic complications: no   No complications documented.   Last Vitals:  Vitals:   03/12/21 1213 03/12/21 1220  BP:  134/74  Pulse: (!) 106 (!) 107  Resp: 20 16  Temp: (!) 36.4 C (!) 36.2 C  SpO2: 94% 99%    Last Pain:  Vitals:   03/12/21 1220  TempSrc: Temporal  PainSc: 3                  Nery Kalisz
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
## 2021-03-12 NOTE — Discharge Instructions (Signed)

## 2021-03-12 NOTE — Transfer of Care (Signed)
Immediate Anesthesia Transfer of Care Note  Patient: Susan Klein  Procedure(s) Performed: KNEE ARTHROSCOPY WITH ANTERIOR CRUCIATE LIGAMENT (ACL) REPAIR WITH HAMSTRING GRAFT (Left )  Patient Location: PACU  Anesthesia Type:General  Level of Consciousness: sedated  Airway & Oxygen Therapy: Patient Spontanous Breathing and Patient connected to face mask oxygen  Post-op Assessment: Report given to RN and Post -op Vital signs reviewed and stable  Post vital signs: Reviewed and stable  Last Vitals:  Vitals Value Taken Time  BP 124/64 03/12/21 1106  Temp    Pulse 81 03/12/21 1106  Resp 15 03/12/21 1106  SpO2 100 % 03/12/21 1106  Vitals shown include unvalidated device data.  Last Pain:  Vitals:   03/12/21 0621  TempSrc: Temporal  PainSc: 0-No pain      Patients Stated Pain Goal: 0 (03/12/21 7124)  Complications: No complications documented.

## 2021-03-13 ENCOUNTER — Encounter: Payer: Self-pay | Admitting: Orthopedic Surgery

## 2021-03-19 ENCOUNTER — Ambulatory Visit: Payer: Medicaid Other | Admitting: Family Medicine

## 2021-03-20 DIAGNOSIS — Z419 Encounter for procedure for purposes other than remedying health state, unspecified: Secondary | ICD-10-CM | POA: Diagnosis not present

## 2021-03-22 DIAGNOSIS — S83512D Sprain of anterior cruciate ligament of left knee, subsequent encounter: Secondary | ICD-10-CM | POA: Diagnosis not present

## 2021-03-26 DIAGNOSIS — R600 Localized edema: Secondary | ICD-10-CM | POA: Diagnosis not present

## 2021-03-26 DIAGNOSIS — S83512D Sprain of anterior cruciate ligament of left knee, subsequent encounter: Secondary | ICD-10-CM | POA: Diagnosis not present

## 2021-04-03 DIAGNOSIS — S83512D Sprain of anterior cruciate ligament of left knee, subsequent encounter: Secondary | ICD-10-CM | POA: Diagnosis not present

## 2021-04-05 ENCOUNTER — Encounter: Payer: Self-pay | Admitting: Family Medicine

## 2021-04-05 ENCOUNTER — Other Ambulatory Visit: Payer: Self-pay | Admitting: Family Medicine

## 2021-04-05 MED ORDER — FLUOXETINE HCL 20 MG PO CAPS: 20.0000 mg | ORAL_CAPSULE | Freq: Every day | ORAL | 0 refills | Status: DC

## 2021-04-10 DIAGNOSIS — S83512D Sprain of anterior cruciate ligament of left knee, subsequent encounter: Secondary | ICD-10-CM | POA: Diagnosis not present

## 2021-04-11 ENCOUNTER — Ambulatory Visit: Payer: Medicaid Other | Admitting: Family Medicine

## 2021-04-15 DIAGNOSIS — S83512D Sprain of anterior cruciate ligament of left knee, subsequent encounter: Secondary | ICD-10-CM | POA: Diagnosis not present

## 2021-04-19 DIAGNOSIS — S83512D Sprain of anterior cruciate ligament of left knee, subsequent encounter: Secondary | ICD-10-CM | POA: Diagnosis not present

## 2021-04-19 DIAGNOSIS — Z419 Encounter for procedure for purposes other than remedying health state, unspecified: Secondary | ICD-10-CM | POA: Diagnosis not present

## 2021-04-24 DIAGNOSIS — S83512D Sprain of anterior cruciate ligament of left knee, subsequent encounter: Secondary | ICD-10-CM | POA: Diagnosis not present

## 2021-04-26 DIAGNOSIS — S83512D Sprain of anterior cruciate ligament of left knee, subsequent encounter: Secondary | ICD-10-CM | POA: Diagnosis not present

## 2021-04-29 ENCOUNTER — Ambulatory Visit: Payer: Medicaid Other | Admitting: Family Medicine

## 2021-04-29 ENCOUNTER — Other Ambulatory Visit: Payer: Self-pay

## 2021-04-29 ENCOUNTER — Encounter: Payer: Self-pay | Admitting: Family Medicine

## 2021-04-29 VITALS — BP 116/62 | HR 94 | Temp 98.5°F | Resp 16 | Ht 64.0 in | Wt 182.6 lb

## 2021-04-29 DIAGNOSIS — F331 Major depressive disorder, recurrent, moderate: Secondary | ICD-10-CM

## 2021-04-29 DIAGNOSIS — F419 Anxiety disorder, unspecified: Secondary | ICD-10-CM | POA: Diagnosis not present

## 2021-04-29 DIAGNOSIS — F5105 Insomnia due to other mental disorder: Secondary | ICD-10-CM

## 2021-04-29 DIAGNOSIS — R450 Nervousness: Secondary | ICD-10-CM

## 2021-04-29 MED ORDER — FLUOXETINE HCL 20 MG PO CAPS: 20.0000 mg | ORAL_CAPSULE | Freq: Every day | ORAL | 3 refills | Status: DC

## 2021-04-29 NOTE — Progress Notes (Signed)
Name: Susan Klein   MRN: [id number]    DOB: [birth year]   Date:04/29/2021       Progress Note  Chief Complaint  Patient presents with   Anxiety     Subjective:   Susan Klein is a 25 y.o. female, presents to clinic for f/up on anxiety/depression  PHQ and GAD 7 reviewed, pt on prozac 20 mg capsules daily Recently had knee surgery, the extra appt's, PT, and physical limitations have been frustrating her a this past month, but she would like to wait and see how her postop recovery goes  Sometimes anxious Depression screen [facility] 2/9 04/29/2021 09/18/2020 04/12/2020  Decreased Interest 0 0 1  Down, Depressed, Hopeless 1 1 1   PHQ - 2 Score 1 1 2   Altered sleeping 2 1 1   Tired, decreased energy 2 1 1   Change in appetite 1 1 2   Feeling bad or failure about yourself  0 0 1  Trouble concentrating 0 0 1  Moving slowly or fidgety/restless 0 0 0  Suicidal thoughts 0 0 0  PHQ-9 Score 6 4 8   Difficult doing work/chores Not difficult at all Not difficult at all Somewhat difficult  Some recent data might be hidden   GAD 7 : Generalized Anxiety Score 04/29/2021 09/18/2020 03/22/2020 06/01/2019  Nervous, Anxious, on Edge 1 1 1 1   Control/stop worrying 0 0 0 0  Worry too much - different things 0 0 0 0  Trouble relaxing 0 0 2 0  Restless 0 0 1 0  Easily annoyed or irritable 1 1 1 1   Afraid - awful might happen 0 0 0 0  Total GAD 7 Score 2 2 5 2   Anxiety Difficulty Not difficult at all Not difficult at all Somewhat difficult Not difficult at all   She also wants to f/up on "prediabetes" Reviewed chart - no prediabetes Lab Results  Component Value Date   HGBA1C 4.9 07/19/2019   Some blood sugar mildly elevated, no family hx of DM She has episodes of feeling weak and jittery and she eats and then feels better in a short while    Current Outpatient Medications:    FLUoxetine (PROZAC) 20 MG capsule, Take 1 capsule (20 mg total) by mouth daily., Disp: 90 capsule, Rfl: 0   levonorgestrel  (LILETTA) 19.5 MCG/DAY IUD IUD, 1 each by Intrauterine route once. 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    Past Surgical History:  Procedure Laterality Date   KNEE ARTHROSCOPY WITH ANTERIOR CRUCIATE LIGAMENT (ACL) REPAIR WITH HAMSTRING GRAFT Left 03/12/2021   Procedure: KNEE ARTHROSCOPY WITH ANTERIOR CRUCIATE LIGAMENT (ACL) REPAIR WITH HAMSTRING GRAFT;  Surgeon: , MD;  Location: [facility];  Service: Orthopedics;  Laterality: Left;   TONSILLECTOMY     TONSILLECTOMY AND ADENOIDECTOMY Bilateral 12/28/2018   Procedure: TONSILLECTOMY;  Surgeon: 07/21/2019, MD;  Location: [facility];  Service: ENT;  Laterality: Bilateral;  Faxed over consent, physicians orders, and H&P/klp    Family History  Problem Relation Age of Onset   Depression Mother    Heart attack Mother    Hypertension Mother  Gestational diabetes Mother    Heart attack Father    Lung cancer Maternal Uncle    Alzheimer's disease Paternal Grandmother    Diabetes Maternal Grandmother     Social History   Tobacco Use   Smoking status: Former    Pack years: 0.00   Smokeless tobacco: Never  Vaping Use   Vaping Use: Former   Substances: Nicotine, Flavoring   Devices: [place], has vaped for about 1 yr  Substance Use Topics   Alcohol use: No   Drug use: No     Allergies  Allergen Reactions   Mucinex [Guaifenesin Er] Hives    Health Maintenance  Topic Date Due   COVID-19 Vaccine (1) 05/15/2021 (Originally 10/13/2003)   HPV VACCINES (1 - 2-dose series) 04/29/2022 (Originally 10/12/2009)   INFLUENZA VACCINE  05/20/2021   CHLAMYDIA  SCREENING  01/08/2022   PAP-Cervical Cytology Screening  12/22/2022   PAP SMEAR-Modifier  12/22/2022   TETANUS/TDAP  10/25/2027   Hepatitis C Screening  Completed   HIV Screening  Completed   Pneumococcal Vaccine 65-36 Years old  Aged Out    Chart Review Today: I personally reviewed active problem list, medication list, allergies, family history, social history, health maintenance, notes from last encounter, lab results, imaging with the patient/caregiver today.   Review of Systems  Constitutional: Negative.   HENT: Negative.    Eyes: Negative.   Respiratory: Negative.    Cardiovascular: Negative.   Gastrointestinal: Negative.   Endocrine: Negative.   Genitourinary: Negative.   Musculoskeletal: Negative.   Skin: Negative.   Allergic/Immunologic: Negative.   Neurological: Negative.   Hematological: Negative.   Psychiatric/Behavioral: Negative.    All other systems reviewed and are negative.   Objective:   Vitals:   04/29/21 1532  BP: 116/62  Pulse: 94  Resp: 16  Temp: 98.5 F (36.9 C)  SpO2: 99%  Weight: 182 lb 9.6 oz (82.8 kg)  Height: 5\' 4"  (1.626 m)    Body mass index is 31.34 kg/m.  Physical Exam Vitals and nursing note reviewed.  Constitutional:      General: She is not in acute distress.    Appearance: Normal appearance. She is well-developed and well-groomed. She is obese. She is not ill-appearing, toxic-appearing or diaphoretic.  HENT:     Head: Normocephalic and atraumatic.     Right Ear: External ear normal.     Left Ear: External ear normal.     Nose: Nose normal.  Eyes:     General:        Right eye: No discharge.        Left eye: No discharge.     Conjunctiva/sclera: Conjunctivae normal.  Neck:     Trachea: No tracheal deviation.  Cardiovascular:     Rate and Rhythm: Normal rate and regular rhythm.  Pulmonary:     Effort: Pulmonary effort is normal. No respiratory distress.     Breath sounds: No stridor.  Skin:    General: Skin is warm  and dry.     Findings: No rash.  Neurological:     Mental Status: She is alert.     Motor: No abnormal muscle tone.     Coordination: Coordination normal.  Psychiatric:        Attention and Perception: Attention normal.        Mood and Affect: Mood and affect normal.        Speech: Speech normal.        Behavior: Behavior normal. Behavior is cooperative.  Thought Content: Thought content does not include homicidal or suicidal ideation. Thought content does not include homicidal or suicidal plan.        Assessment & Plan:     ICD-10-CM   1. Moderate episode of recurrent major depressive disorder (HCC)  F33.1    phq positive, recent surgery and some increase stress and frustration, she wants to keep dose the same - f/up in 1 year for refill sooner as needed    2. Insomnia secondary to anxiety  F41.9    F51.05    discussed other meds for anxiety and therapy as options for management, she declined referral right now    3. Jittery  R45.0    episodes which improve after eating - glucometer given to patient, handout and info on hypoglycemia reviewed, diet recommendations reviewed       Return in about 1 year (around 04/29/2022) for Routine follow-up - f/up sooner as needed if having low blood sugar .   Danelle Berry, PA-C 04/29/21 3:43 PM

## 2021-04-29 NOTE — Patient Instructions (Signed)
Hypoglycemia Hypoglycemia is when the sugar (glucose) level in your blood is too low. Low blood sugar can happen to people who have diabetes and people who do not have diabetes. Low blood sugar can happenquickly, and it can be an emergency. What are the causes? This condition happens most often in people who have diabetes. It may be caused by: Diabetes medicine. Not eating enough, or not eating often enough. Doing more physical activity. Drinking alcohol on an empty stomach. If you do not have diabetes, this condition may be caused by: A tumor in the pancreas. Not eating enough, or not eating for long periods at a time (fasting). A very bad infection or illness. Problems after having weight loss (bariatric) surgery. Kidney failure or liver failure. Certain medicines. What increases the risk? This condition is more likely to develop in people who: Have diabetes and take medicines to lower their blood sugar. Abuse alcohol. Have a very bad illness. What are the signs or symptoms? Mild Hunger. Sweating and feeling clammy. Feeling dizzy or light-headed. Being sleepy or having trouble sleeping. Feeling like you may vomit (nauseous). A fast heartbeat. A headache. Blurry vision. Mood changes, such as: Being grouchy. Feeling worried or nervous (anxious). Tingling or loss of feeling (numbness) around your mouth, lips, or tongue. Moderate Confusion and poor judgment. Behavior changes. Weakness. Uneven heartbeat. Trouble with moving (coordination). Very low Very low blood sugar (severe hypoglycemia) is a medical emergency. It can cause: Fainting. Seizures. Loss of consciousness (coma). Death. How is this treated? Treating low blood sugar Low blood sugar is often treated by eating or drinking something that has sugar in it right away. The food or drink should contain 15 grams of a fast-acting carb (carbohydrate). Options include: 4 oz (120 mL) of fruit juice. 4 oz (120 mL) of  regular soda (not diet soda). A few pieces of hard candy. Check food labels to see how many pieces to eat for 15 grams. 1 Tbsp (15 mL) of sugar or honey. 4 glucose tablets. 1 tube of glucose gel. Treating low blood sugar if you have diabetes If you can think clearly and swallow safely, follow the 15:15 rule: Take 15 grams of a fast-acting carb. Talk with your doctor about how much you should take. Always keep a source of fast-acting carb with you, such as: Glucose tablets (take 4 tablets). A few pieces of hard candy. Check food labels to see how many pieces to eat for 15 grams. 4 oz (120 mL) of fruit juice. 4 oz (120 mL) of regular soda (not diet soda). 1 Tbsp (15 mL) of honey or sugar. 1 tube of glucose gel. Check your blood sugar 15 minutes after you take the carb. If your blood sugar is still at or below 70 mg/dL (3.9 mmol/L), take 15 grams of a carb again. If your blood sugar does not go above 70 mg/dL (3.9 mmol/L) after 3 tries, get help right away. After your blood sugar goes back to normal, eat a meal or a snack within 1 hour.  Treating very low blood sugar If your blood sugar is below 54 mg/dL (3 mmol/L), you have very low blood sugar, or severe hypoglycemia. This is an emergency. Get medical help right away. If you have very low blood sugar and you cannot eat or drink, you will need to be given a hormone called glucagon. A family member or friend should learn how to check your blood sugar and how to give you glucagon. Ask your doctor if youneed   to have an emergency glucagon kit at home. Very low blood sugar may also need to be treated in a hospital. Follow these instructions at home: General instructions Take over-the-counter and prescription medicines only as told by your doctor. Stay aware of your blood sugar as told by your doctor. If you drink alcohol: Limit how much you have to: 0-1 drink a day for women who are not pregnant. 0-2 drinks a day for men. Know how much  alcohol is in your drink. In the [place] one drink equals one 12 oz bottle of beer (355 mL), one 5 oz glass of wine (148 mL), or one 1 oz glass of hard liquor (44 mL). Be sure to eat food when you drink alcohol. Know that your body absorbs alcohol quickly. This may lead to low blood sugar later. Be sure to keep checking your blood sugar. Keep all follow-up visits. If you have diabetes:  Always have a fast-acting carb (15 grams) with you to treat low blood sugar. Follow your diabetes care plan as told by your doctor. Make sure you: Know the symptoms of low blood sugar. Check your blood sugar as often as told. Always check it before and after exercise. Always check your blood sugar before you drive. Take your medicines as told. Follow your meal plan. Eat on time. Do not skip meals. Share your diabetes care plan with: Your work or school. People you live with. Carry a card or wear jewelry that says you have diabetes.  Where to find more information American Diabetes Association: www.diabetes.org Contact a doctor if: You have trouble keeping your blood sugar in your target range. You have low blood sugar often. Get help right away if: You still have symptoms after you eat or drink something that contains 15 grams of fast-acting carb, and you cannot get your blood sugar above 70 mg/dL by following the 15:15 rule. Your blood sugar is below 54 mg/dL (3 mmol/L). You have a seizure. You faint. These symptoms may be an emergency. Get help right away. Call your local emergency services (911 in the [place] Do not wait to see if the symptoms will go away. Do not drive yourself to the hospital. Summary Hypoglycemia happens when the level of sugar (glucose) in your blood is too low. Low blood sugar can happen to people who have diabetes and people who do not have diabetes. Low blood sugar can happen quickly, and it can be an emergency. Make sure you know the symptoms of low blood sugar and know how  to treat it. Always keep a source of sugar (fast-acting carb) with you to treat low blood sugar. This information is not intended to replace advice given to you by your health care provider. Make sure you discuss any questions you have with your healthcare provider. Document Revised: 09/06/2020 Document Reviewed: 09/06/2020 Elsevier Patient Education  2022 [employer].

## 2021-05-01 DIAGNOSIS — S83512D Sprain of anterior cruciate ligament of left knee, subsequent encounter: Secondary | ICD-10-CM | POA: Diagnosis not present

## 2021-05-03 DIAGNOSIS — S83512D Sprain of anterior cruciate ligament of left knee, subsequent encounter: Secondary | ICD-10-CM | POA: Diagnosis not present

## 2021-05-20 DIAGNOSIS — Z419 Encounter for procedure for purposes other than remedying health state, unspecified: Secondary | ICD-10-CM | POA: Diagnosis not present

## 2021-06-12 DIAGNOSIS — S83512D Sprain of anterior cruciate ligament of left knee, subsequent encounter: Secondary | ICD-10-CM | POA: Diagnosis not present

## 2021-06-20 DIAGNOSIS — Z419 Encounter for procedure for purposes other than remedying health state, unspecified: Secondary | ICD-10-CM | POA: Diagnosis not present

## 2021-07-20 DIAGNOSIS — Z419 Encounter for procedure for purposes other than remedying health state, unspecified: Secondary | ICD-10-CM | POA: Diagnosis not present

## 2021-07-23 ENCOUNTER — Other Ambulatory Visit: Payer: Self-pay | Admitting: Family Medicine

## 2021-08-20 DIAGNOSIS — Z419 Encounter for procedure for purposes other than remedying health state, unspecified: Secondary | ICD-10-CM | POA: Diagnosis not present

## 2021-09-04 DIAGNOSIS — S83512D Sprain of anterior cruciate ligament of left knee, subsequent encounter: Secondary | ICD-10-CM | POA: Diagnosis not present

## 2021-09-19 DIAGNOSIS — Z419 Encounter for procedure for purposes other than remedying health state, unspecified: Secondary | ICD-10-CM | POA: Diagnosis not present

## 2021-10-20 DIAGNOSIS — Z419 Encounter for procedure for purposes other than remedying health state, unspecified: Secondary | ICD-10-CM | POA: Diagnosis not present

## 2021-11-01 DIAGNOSIS — Z9889 Other specified postprocedural states: Secondary | ICD-10-CM | POA: Diagnosis not present

## 2021-11-20 DIAGNOSIS — Z419 Encounter for procedure for purposes other than remedying health state, unspecified: Secondary | ICD-10-CM | POA: Diagnosis not present

## 2021-12-18 DIAGNOSIS — Z419 Encounter for procedure for purposes other than remedying health state, unspecified: Secondary | ICD-10-CM | POA: Diagnosis not present

## 2022-01-13 ENCOUNTER — Ambulatory Visit (INDEPENDENT_AMBULATORY_CARE_PROVIDER_SITE_OTHER): Payer: Medicaid Other | Admitting: Obstetrics and Gynecology

## 2022-01-13 ENCOUNTER — Other Ambulatory Visit: Payer: Self-pay

## 2022-01-13 ENCOUNTER — Other Ambulatory Visit (HOSPITAL_COMMUNITY)
Admission: RE | Admit: 2022-01-13 | Discharge: 2022-01-13 | Disposition: A | Discharge status: Home / Self Care | Payer: Medicaid Other | Source: Ambulatory Visit | Attending: Obstetrics and Gynecology | Admitting: Obstetrics and Gynecology

## 2022-01-13 ENCOUNTER — Encounter: Payer: Self-pay | Admitting: Obstetrics and Gynecology

## 2022-01-13 VITALS — BP 130/80 | Ht 63.0 in | Wt 185.0 lb

## 2022-01-13 DIAGNOSIS — Z0000 Encounter for general adult medical examination without abnormal findings: Secondary | ICD-10-CM | POA: Diagnosis not present

## 2022-01-13 DIAGNOSIS — Z01419 Encounter for gynecological examination (general) (routine) without abnormal findings: Secondary | ICD-10-CM | POA: Insufficient documentation

## 2022-01-13 DIAGNOSIS — Z113 Encounter for screening for infections with a predominantly sexual mode of transmission: Secondary | ICD-10-CM | POA: Insufficient documentation

## 2022-01-13 DIAGNOSIS — R102 Pelvic and perineal pain: Secondary | ICD-10-CM
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
## 2022-01-14 LAB — FETAL NONSTRESS TEST

## 2022-01-15 LAB — CERVICOVAGINAL ANCILLARY ONLY
Chlamydia: NEGATIVE
Comment: NEGATIVE
Comment: NEGATIVE
Comment: NORMAL
Neisseria Gonorrhea: NEGATIVE
Trichomonas: NEGATIVE

## 2022-01-18 DIAGNOSIS — Z419 Encounter for procedure for purposes other than remedying health state, unspecified: Secondary | ICD-10-CM | POA: Diagnosis not present

## 2022-01-30 ENCOUNTER — Ambulatory Visit (INDEPENDENT_AMBULATORY_CARE_PROVIDER_SITE_OTHER): Payer: Medicaid Other

## 2022-01-30 DIAGNOSIS — R102 Pelvic and perineal pain: Secondary | ICD-10-CM | POA: Diagnosis not present

## 2022-02-10 ENCOUNTER — Encounter: Payer: Self-pay | Admitting: Obstetrics and Gynecology

## 2022-02-10 ENCOUNTER — Ambulatory Visit (INDEPENDENT_AMBULATORY_CARE_PROVIDER_SITE_OTHER): Payer: Medicaid Other | Admitting: Obstetrics and Gynecology

## 2022-02-10 VITALS — BP 122/82 | Ht 63.0 in | Wt 184.0 lb

## 2022-02-10 DIAGNOSIS — Z712 Person consulting for explanation of examination or test findings: Secondary | ICD-10-CM | POA: Diagnosis not present
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
## 2022-02-17 DIAGNOSIS — Z419 Encounter for procedure for purposes other than remedying health state, unspecified: Secondary | ICD-10-CM | POA: Diagnosis not present

## 2022-03-20 DIAGNOSIS — Z419 Encounter for procedure for purposes other than remedying health state, unspecified: Secondary | ICD-10-CM | POA: Diagnosis not present

## 2022-04-19 DIAGNOSIS — Z419 Encounter for procedure for purposes other than remedying health state, unspecified: Secondary | ICD-10-CM | POA: Diagnosis not present

## 2022-05-18 DIAGNOSIS — R070 Pain in throat: Secondary | ICD-10-CM | POA: Diagnosis not present

## 2022-05-18 DIAGNOSIS — J029 Acute pharyngitis, unspecified: Secondary | ICD-10-CM | POA: Diagnosis not present

## 2022-05-20 DIAGNOSIS — Z419 Encounter for procedure for purposes other than remedying health state, unspecified: Secondary | ICD-10-CM | POA: Diagnosis not present

## 2022-06-03 ENCOUNTER — Encounter: Payer: Self-pay | Admitting: Family Medicine

## 2022-06-03 ENCOUNTER — Ambulatory Visit (INDEPENDENT_AMBULATORY_CARE_PROVIDER_SITE_OTHER): Payer: Medicaid Other | Admitting: Family Medicine

## 2022-06-03 VITALS — BP 140/82 | HR 95 | Temp 98.2°F | Resp 16 | Ht 63.0 in | Wt 185.7 lb

## 2022-06-03 DIAGNOSIS — F439 Reaction to severe stress, unspecified: Secondary | ICD-10-CM

## 2022-06-03 DIAGNOSIS — Z636 Dependent relative needing care at home: Secondary | ICD-10-CM

## 2022-06-03 DIAGNOSIS — R030 Elevated blood-pressure reading, without diagnosis of hypertension: Secondary | ICD-10-CM | POA: Diagnosis not present

## 2022-06-03 DIAGNOSIS — F331 Major depressive disorder, recurrent, moderate: Secondary | ICD-10-CM

## 2022-06-03 DIAGNOSIS — R519 Headache, unspecified: Secondary | ICD-10-CM

## 2022-06-03 MED ORDER — FLUOXETINE HCL 40 MG PO CAPS: 40.0000 mg | ORAL_CAPSULE | Freq: Every day | ORAL | 3 refills | Status: DC

## 2022-06-03 MED ORDER — LORAZEPAM 0.5 MG PO TABS: 0.5000 mg | ORAL_TABLET | Freq: Three times a day (TID) | ORAL | 0 refills | Status: DC

## 2022-06-03 NOTE — Progress Notes (Signed)
Patient ID: Susan Klein, female    DOB: 21-Oct-1995, 26 y.o.   MRN: [id number]  PCP: Susan Grana, PA-C  Chief Complaint  Patient presents with   Headache    Pt had aches for the past 3-4 days checked her BP it was running high.    Subjective:   Susan Klein is a 26 y.o. female, presents to clinic with CC of the following:  Headache     Having headaches for 3-4 days HA's uncomfortable mostly across forehead She used to have high BP and meds, but hadn't needed for a long time, she checked her BP and it was 151/97 Tried ibuprofen it helped some, no tylenol, has Excedrin migraine but didn't try it Under a lot of stress Drinking plenty  Up a little later than she wants to be but sleeping 7-8 hours of sleep a night Her neck and back always hurt nothing different than her normal No vision changes  A little nausea with attributes to anxiety being so high right now   Biggest stress is taking care of her grandma who is on hospice       Patient Active Problem List   Diagnosis Date Noted   Class 1 obesity due to excess calories without serious comorbidity with body mass index (BMI) of 30.0 to 30.9 in adult 03/22/2020   Cold sensitivity 11/16/2019   Moderate episode of recurrent major depressive disorder ([place]) 01/06/2019   Irritable bowel syndrome with constipation 01/06/2019   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 , Disp: , Rfl:    Allergies  Allergen Reactions   Mucinex [Guaifenesin Er] Hives     Social History   Tobacco Use   Smoking status: Former   Smokeless tobacco: Never  Scientific laboratory technician Use: Former   Substances: Nicotine, Flavoring   Devices: [place], has vaped for about 1 yr  Substance Use Topics   Alcohol use: No    Drug use: No      Chart Review Today: I personally reviewed active problem list, medication list, allergies, family history, social history, health maintenance, notes from last encounter, lab results, imaging with the patient/caregiver today.   Review of Systems  Constitutional: Negative.   HENT: Negative.    Eyes: Negative.   Respiratory: Negative.    Cardiovascular: Negative.   Gastrointestinal: Negative.   Endocrine: Negative.   Genitourinary: Negative.   Musculoskeletal: Negative.   Skin: Negative.   Allergic/Immunologic: Negative.   Neurological:  Positive for headaches.  Hematological: Negative.   Psychiatric/Behavioral: Negative.    All other systems reviewed and are negative.      Objective:   Vitals:   06/03/22 0958  BP: (!) 140/82  Pulse: 95  Resp: 16  Temp: 98.2 F (36.8 C)  TempSrc: Oral  SpO2: 98%  Weight: 185 lb 11.2 oz (84.2 kg)  Height: 5\' 3"  (1.6 m)    Body mass index is 32.9 kg/m.  Physical Exam Vitals and nursing note reviewed.  Constitutional:      General: She is not in acute distress.    Appearance: Normal appearance. She is well-developed. She is not ill-appearing, toxic-appearing or diaphoretic.  HENT:     Head: Normocephalic and atraumatic.  Right Ear: External ear normal.     Left Ear: External ear normal.     Nose: Nose normal.     Mouth/Throat:     Mouth: Mucous membranes are moist.     Pharynx: Oropharynx is clear. No oropharyngeal exudate or posterior oropharyngeal erythema.  Eyes:     General:        Right eye: No discharge.        Left eye: No discharge.     Conjunctiva/sclera: Conjunctivae normal.  Neck:     Trachea: No tracheal deviation.  Cardiovascular:     Rate and Rhythm: Normal rate and regular rhythm.     Pulses: Normal pulses.     Heart sounds: Normal heart sounds.  Pulmonary:     Effort: Pulmonary effort is normal. No respiratory distress.     Breath sounds: Normal breath sounds. No stridor.   Abdominal:     General: Bowel sounds are normal.  Musculoskeletal:        General: Normal range of motion.     Cervical back: Normal range of motion and neck supple.  Skin:    General: Skin is warm and dry.     Findings: No rash.  Neurological:     General: No focal deficit present.     Mental Status: She is alert.     Cranial Nerves: No cranial nerve deficit, dysarthria or facial asymmetry.     Sensory: Sensation is intact.     Motor: Motor function is intact. No weakness or abnormal muscle tone.     Coordination: Coordination is intact. Coordination normal.     Gait: Gait is intact.  Psychiatric:        Mood and Affect: Mood normal.        Behavior: Behavior normal.      Results for orders placed or performed in visit on 01/14/22  Fetal nonstress test  Result Value Ref Range   NST Locations     NST Indications     NST Duration     NST Baseline     FHR Variabilities     Accelerations > 10bpm     Accelerations > 15 BPM     Decelerations     Uterine Activity     Acoustic Stimulation     Assessment     NST Return     NST Read by         Assessment & Plan:   1. Moderate episode of recurrent major depressive disorder (HCC) On Prozac 20 mg, increased to 40 mg and monitor with 1 month follow-up Increased stress with caring for grandma on hospice - increase med dose for ~ months Encouraged other resources - may have grief counselor or SW available with hospice, therapy, self care  2. Situational stress Dose increase as noted above, offered referrals Helping care for dying grandmother, some panic attacks, meds for severe sx only, no driving, she understands meds are sedating and will not be refilled, can be habit forming - reserve for severe episodes - LORazepam (ATIVAN) 0.5 MG tablet; Take 1 tablet (0.5 mg total) by mouth every 8 (eight) hours.  Dispense: 10 tablet; Refill: 0  3. Caregiver stress - LORazepam (ATIVAN) 0.5 MG tablet; Take 1 tablet (0.5 mg total) by  mouth every 8 (eight) hours.  Dispense: 10 tablet; Refill: 0  4. Nonintractable headache, unspecified chronicity pattern, unspecified headache type Headache for 2-3 days, no concerning findings on exam nonfocal neurological exam, difficult to say what  came first or is causative with increased stress, mild headache and elevated blood pressure. I encouraged her to use ibuprofen, Tylenol or Excedrin Migraine but to avoid continuous or daily use  5. Elevated BP without diagnosis of hypertension With severe situational stress we will monitor her blood pressure before starting blood pressure medications.  Medications have also been adjusted for history of depression and panic attacks BP Readings from Last 3 Encounters:  06/03/22 (!) 140/82  02/10/22 122/82  01/13/22 130/80    Return in about 3 weeks (around 06/24/2022), or HTN.      Danelle Berry, PA-C 06/03/22 10:18 AM

## 2022-06-20 DIAGNOSIS — Z419 Encounter for procedure for purposes other than remedying health state, unspecified: Secondary | ICD-10-CM | POA: Diagnosis not present

## 2022-06-26 ENCOUNTER — Ambulatory Visit: Payer: Medicaid Other | Admitting: Family Medicine

## 2022-07-01 ENCOUNTER — Encounter: Payer: Self-pay | Admitting: Family Medicine

## 2022-07-01 ENCOUNTER — Ambulatory Visit (INDEPENDENT_AMBULATORY_CARE_PROVIDER_SITE_OTHER): Payer: Medicaid Other | Admitting: Family Medicine

## 2022-07-01 VITALS — BP 124/80 | HR 96 | Temp 97.5°F | Resp 16 | Ht 63.0 in | Wt 185.8 lb

## 2022-07-01 DIAGNOSIS — F419 Anxiety disorder, unspecified: Secondary | ICD-10-CM | POA: Diagnosis not present

## 2022-07-01 DIAGNOSIS — R519 Headache, unspecified: Secondary | ICD-10-CM

## 2022-07-01 DIAGNOSIS — E66811 Obesity, class 1: Secondary | ICD-10-CM

## 2022-07-01 DIAGNOSIS — F439 Reaction to severe stress, unspecified: Secondary | ICD-10-CM | POA: Diagnosis not present

## 2022-07-01 DIAGNOSIS — Z636 Dependent relative needing care at home: Secondary | ICD-10-CM | POA: Diagnosis not present

## 2022-07-01 DIAGNOSIS — E669 Obesity, unspecified: Secondary | ICD-10-CM

## 2022-07-01 DIAGNOSIS — F331 Major depressive disorder, recurrent, moderate: Secondary | ICD-10-CM | POA: Diagnosis not present

## 2022-07-01 DIAGNOSIS — F5105 Insomnia due to other mental disorder: Secondary | ICD-10-CM

## 2022-07-01 DIAGNOSIS — Z23 Encounter for immunization: Secondary | ICD-10-CM

## 2022-07-01 DIAGNOSIS — R030 Elevated blood-pressure reading, without diagnosis of hypertension: Secondary | ICD-10-CM

## 2022-07-01 DIAGNOSIS — Z6832 Body mass index (BMI) 32.0-32.9, adult: Secondary | ICD-10-CM | POA: Diagnosis not present

## 2022-07-01 NOTE — Assessment & Plan Note (Signed)
she did sleep soundly after her grandmother passed, she endorses chronic fatigue, but sleeping better in general Sleep hygiene encouraged Not using ativan for sleep

## 2022-07-01 NOTE — Progress Notes (Signed)
Name: Susan Klein   MRN: [id number]    DOB: 11-19-1995   Date:07/01/2022       Progress Note  Chief Complaint  Patient presents with   Follow-up    Elevated BP   Depression     Subjective:   Susan Klein is a 26 y.o. female, presents to clinic for recheck on BP and mood/stress/anxiety  BP much better today, not on meds BP Readings from Last 3 Encounters:  07/01/22 124/80  06/03/22 (!) 140/82  02/10/22 122/82   HA's not having HA's any more   Situational stress/care giver stress and underlying MDD - her grandmother passed away about 2 weeks ago, they had the funeral within a week, she was able to sleep and rest after that, her stress seems to be a little better Phq9 and gad 7 about the same, pt on prozac 40 mg (dose increased) And given rescue ativan for severe anxiety attacks She only used one dose of ativan No SE or concerns with the prozac dose increase    07/01/2022    1:41 PM 06/03/2022    9:57 AM 04/29/2021    3:40 PM  Depression screen PHQ 2/9  Decreased Interest 1 1 0  Down, Depressed, Hopeless 1 1 1   PHQ - 2 Score 2 2 1   Altered sleeping 1 1 2   Tired, decreased energy 3 1 2   Change in appetite 0 0 1  Feeling bad or failure about yourself  0 0 0  Trouble concentrating 1 1 0  Moving slowly or fidgety/restless 1 1 0  Suicidal thoughts 0 0 0  PHQ-9 Score 8 6 6   Difficult doing work/chores Somewhat difficult Somewhat difficult Not difficult at all      07/01/2022    1:48 PM 06/03/2022    9:58 AM 04/29/2021    3:40 PM 09/18/2020   11:16 AM  GAD 7 : Generalized Anxiety Score  Nervous, Anxious, on Edge 1 1 1 1   Control/stop worrying 1 1 0 0  Worry too much - different things 1 1 0 0  Trouble relaxing 1 1 0 0  Restless 1 1 0 0  Easily annoyed or irritable 0 1 1 1   Afraid - awful might happen 0 1 0 0  Total GAD 7 Score 5 7 2 2   Anxiety Difficulty Somewhat difficult Somewhat difficult Not difficult at all Not difficult at all         Current  Outpatient Medications:    FLUoxetine (PROZAC) 40 MG capsule, Take 1 capsule (40 mg total) by mouth daily., Disp: 90 capsule, Rfl: 3   levonorgestrel (LILETTA) 19.5 MCG/DAY IUD IUD, 1 each by Intrauterine route once. , Disp: , Rfl:    LORazepam (ATIVAN) 0.5 MG tablet, Take 1 tablet (0.5 mg total) by mouth every 8 (eight) hours., Disp: 10 tablet, Rfl: 0  Patient Active Problem List   Diagnosis Date Noted   Class 1 obesity due to excess calories without serious comorbidity with body mass index (BMI) of 30.0 to 30.9 in adult 03/22/2020   Cold sensitivity 11/16/2019   Moderate episode of recurrent major depressive disorder (HCC) 01/06/2019   Irritable bowel syndrome with constipation 01/06/2019   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    Past Surgical History:  Procedure Laterality Date   KNEE ARTHROSCOPY WITH ANTERIOR CRUCIATE LIGAMENT (ACL) REPAIR WITH HAMSTRING GRAFT Left 03/12/2021   Procedure: KNEE ARTHROSCOPY WITH ANTERIOR CRUCIATE LIGAMENT (ACL) REPAIR WITH HAMSTRING GRAFT;  Surgeon: , MD;  Location: [facility];  Service: Orthopedics;  Laterality: Left;   TONSILLECTOMY     TONSILLECTOMY AND ADENOIDECTOMY Bilateral 12/28/2018   Procedure: TONSILLECTOMY;  Surgeon: Geanie Logan, MD;  Location: [facility];  Service: ENT;  Laterality: Bilateral;  Faxed over consent, physicians orders, and H&P/klp    Family History  Problem Relation Age of Onset   Depression Mother    Heart attack Mother    Hypertension Mother    Gestational diabetes Mother    Heart attack Father    Lung cancer Maternal Uncle    Alzheimer's disease Paternal Grandmother    Diabetes Maternal Grandmother     Social History   Tobacco Use   Smoking status: Former   Smokeless tobacco: Never  Building services engineer Use: Former   Substances: Nicotine, Flavoring   Devices: Hyde, has vaped for about 1 yr  Substance Use Topics   Alcohol use: No   Drug use: No     Allergies  Allergen Reactions    Mucinex [Guaifenesin Er] Hives    Health Maintenance  Topic Date Due   PAP-Cervical Cytology Screening  12/22/2022   PAP SMEAR-Modifier  12/22/2022   CHLAMYDIA SCREENING  01/14/2023   TETANUS/TDAP  10/25/2027   INFLUENZA VACCINE  Completed   HPV VACCINES  Completed   Hepatitis C Screening  Completed   HIV Screening  Completed   COVID-19 Vaccine  Discontinued    Chart Review Today: I personally reviewed active problem list, medication list, allergies, family history, social history, health maintenance, notes from last encounter, lab results, imaging with the patient/caregiver today.   Review of Systems  Constitutional: Negative.   HENT: Negative.    Eyes: Negative.   Respiratory: Negative.    Cardiovascular: Negative.   Gastrointestinal: Negative.   Endocrine: Negative.   Genitourinary: Negative.   Musculoskeletal: Negative.   Skin: Negative.   Allergic/Immunologic: Negative.   Neurological: Negative.   Hematological: Negative.   Psychiatric/Behavioral: Negative.    All other systems reviewed and are negative.    Objective:   Vitals:   07/01/22 1342  BP: 124/80  Pulse: 96  Resp: 16  Temp: (!) 97.5 F (36.4 C)  TempSrc: Oral  SpO2: 98%  Weight: 185 lb 12.8 oz (84.3 kg)  Height: 5\' 3"  (1.6 m)    Body mass index is 32.91 kg/m.  Physical Exam Vitals and nursing note reviewed.  Constitutional:      General: She is not in acute distress.    Appearance: Normal appearance. She is well-developed. She is not ill-appearing, toxic-appearing or diaphoretic.  HENT:     Head: Normocephalic and atraumatic.     Nose: Nose normal.  Eyes:     General:        Right eye: No discharge.        Left eye: No discharge.     Conjunctiva/sclera: Conjunctivae normal.  Neck:     Trachea: No tracheal deviation.  Cardiovascular:     Rate and Rhythm: Normal rate and regular rhythm.  Pulmonary:     Effort: Pulmonary effort is normal. No respiratory distress.     Breath  sounds: No stridor.  Musculoskeletal:        General: Normal range of motion.  Skin:    General: Skin is warm and dry.     Findings: No rash.  Neurological:     Mental Status: She is alert.     Motor: No abnormal muscle tone.     Coordination: Coordination normal.  Psychiatric:        Attention and Perception: Attention normal.        Mood and Affect: Mood and affect normal.        Speech: Speech normal.        Behavior: Behavior normal. Behavior is cooperative.        Thought Content: Thought content normal.        Cognition and Memory: Cognition and memory normal.        Judgment: Judgment normal.         Assessment & Plan:   Problem List Items Addressed This Visit       Other   Insomnia secondary to anxiety    she did sleep soundly after her grandmother passed, she endorses chronic fatigue, but sleeping better in general Sleep hygiene encouraged Not using ativan for sleep      Moderate episode of recurrent major depressive disorder ([place]) - Primary    phq score the same, pt endorses slightly improved sx, no SE or concerns with prozac dose increase, continue 40 mg daily    07/01/2022    1:41 PM 06/03/2022    9:57 AM 04/29/2021    3:40 PM  Depression screen PHQ 2/9  Decreased Interest 1 1 0  Down, Depressed, Hopeless 1 1 1   PHQ - 2 Score 2 2 1   Altered sleeping 1 1 2   Tired, decreased energy 3 1 2   Change in appetite 0 0 1  Feeling bad or failure about yourself  0 0 0  Trouble concentrating 1 1 0  Moving slowly or fidgety/restless 1 1 0  Suicidal thoughts 0 0 0  PHQ-9 Score 8 6 6   Difficult doing work/chores Somewhat difficult Somewhat difficult Not difficult at all  With recent death of grandparent and holidays approaching will keep same dose, can decrease later if desired We will f/up in 3 months to reassess Encouraged her to reach out if she has any SE       Class 1 obesity with body mass index (BMI) of 32.0 to 32.9 in adult   Other Visit Diagnoses      Elevated BP without diagnosis of hypertension       BP returned to normal w/o meds, likely due to stress   Need for influenza vaccination       Relevant Orders   Flu Vaccine QUAD 75mo+IM (Fluarix, Fluzone & Alfiuria Quad PF) (Completed)   Situational stress       her grandmother did pass away a few weeks ago, less caregiver stress, but grieving   Caregiver stress       see above   Nonintractable headache, unspecified chronicity pattern, unspecified headache type       HA's resolved, likely due to situational stress and lack of sleep while caring for her grandmother on hospice        Return in about 3 months (around 09/30/2022) for Annual Physical.   Delsa Grana, PA-C 07/01/22 1:48 PM

## 2022-07-01 NOTE — Assessment & Plan Note (Signed)
phq score the same, pt endorses slightly improved sx, no SE or concerns with prozac dose increase, continue 40 mg daily    07/01/2022    1:41 PM 06/03/2022    9:57 AM 04/29/2021    3:40 PM  Depression screen PHQ 2/9  Decreased Interest 1 1 0  Down, Depressed, Hopeless 1 1 1   PHQ - 2 Score 2 2 1   Altered sleeping 1 1 2   Tired, decreased energy 3 1 2   Change in appetite 0 0 1  Feeling bad or failure about yourself  0 0 0  Trouble concentrating 1 1 0  Moving slowly or fidgety/restless 1 1 0  Suicidal thoughts 0 0 0  PHQ-9 Score 8 6 6   Difficult doing work/chores Somewhat difficult Somewhat difficult Not difficult at all  With recent death of grandparent and holidays approaching will keep same dose, can decrease later if desired We will f/up in 3 months to reassess Encouraged her to reach out if she has any SE

## 2022-07-20 DIAGNOSIS — Z419 Encounter for procedure for purposes other than remedying health state, unspecified: Secondary | ICD-10-CM | POA: Diagnosis not present

## 2022-08-20 DIAGNOSIS — Z419 Encounter for procedure for purposes other than remedying health state, unspecified: Secondary | ICD-10-CM | POA: Diagnosis not present

## 2022-08-26 ENCOUNTER — Encounter: Payer: Self-pay | Admitting: Family Medicine

## 2022-08-26 ENCOUNTER — Ambulatory Visit (INDEPENDENT_AMBULATORY_CARE_PROVIDER_SITE_OTHER): Payer: Medicaid Other | Admitting: Family Medicine

## 2022-08-26 VITALS — BP 124/80 | HR 94 | Temp 97.9°F | Resp 16 | Ht 63.0 in | Wt 189.2 lb

## 2022-08-26 DIAGNOSIS — J329 Chronic sinusitis, unspecified: Secondary | ICD-10-CM | POA: Diagnosis not present

## 2022-08-26 DIAGNOSIS — R040 Epistaxis: Secondary | ICD-10-CM

## 2022-08-26 MED ORDER — IPRATROPIUM BROMIDE 0.03 % NA SOLN: 2.0000 | Freq: Two times a day (BID) | NASAL | 12 refills | Status: DC

## 2022-08-26 MED ORDER — CETIRIZINE HCL 10 MG PO TABS: 10.0000 mg | ORAL_TABLET | Freq: Every day | ORAL | 11 refills | Status: DC

## 2022-08-26 NOTE — Progress Notes (Signed)
Patient ID: Susan Klein, female    DOB: 1996-05-30, 26 y.o.   MRN: [id number]  PCP: Danelle Berry, PA-C  Chief Complaint  Patient presents with   Nasal Congestion   Epistaxis    Started since past Friday,     Subjective:   Susan Klein is a 26 y.o. female, presents to clinic with CC of the following:  Epistaxis     Friday had nose bleed, Saturday 2 nose bleeds  Scant amount Sunday  Chronic congestion started to be worse last Friday with weather changes and using indoor wood stove to heat home No fever, sweats, ha, sore throat, cough.  Not increased runny nose but postnasal drip that often tastes like blood and increased congestion w/o trying any meds at home. Hx of seeing ENT previously in 2020 for tonsilectomy      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      Current Outpatient Medications:    FLUoxetine (PROZAC) 40 MG capsule, Take 1 capsule (40 mg total) by mouth daily., Disp: 90 capsule, Rfl: 3   levonorgestrel (LILETTA) 19.5 MCG/DAY IUD IUD, 1 each by Intrauterine route once. , Disp: , Rfl:    LORazepam (ATIVAN) 0.5 MG tablet, Take 1 tablet (0.5 mg total) by mouth every 8 (eight) hours., Disp: 10 tablet, Rfl: 0   Allergies  Allergen Reactions   Mucinex [Guaifenesin Er] Hives     Social History   Tobacco Use   Smoking status: Former   Smokeless tobacco: Never  Building services engineer Use: Former   Substances: Nicotine, Flavoring   Devices: [place], has vaped for about 1 yr  Substance Use Topics   Alcohol use: No   Drug use: No      Chart Review Today: I personally reviewed active problem list, medication list, allergies, family history, social history,  health maintenance, notes from last encounter, lab results, imaging with the patient/caregiver today.   Review of Systems  Constitutional: Negative.   HENT:  Positive for nosebleeds.   Eyes: Negative.   Respiratory: Negative.    Cardiovascular: Negative.   Gastrointestinal: Negative.   Endocrine: Negative.   Genitourinary: Negative.   Musculoskeletal: Negative.   Skin: Negative.   Allergic/Immunologic: Negative.   Neurological: Negative.   Hematological: Negative.   Psychiatric/Behavioral: Negative.    All other systems reviewed and are negative.      Objective:   Vitals:   08/26/22 0854  BP: 124/80  Pulse: 94  Resp: 16  Temp: 97.9 F (36.6 C)  TempSrc: Oral  SpO2: 98%  Weight: 189 lb 3.2 oz (85.8 kg)  Height: 5\' 3"  (1.6 m)    Body mass index is 33.52 kg/m.  Physical Exam Vitals and nursing note reviewed.  Constitutional:      General: She is not in acute distress.    Appearance: Normal appearance. She is well-developed, well-groomed and overweight. She is not ill-appearing, toxic-appearing or diaphoretic.  HENT:     Head: Normocephalic and atraumatic.     Right Ear: Tympanic membrane, ear canal and external ear normal. There is no impacted cerumen.     Left Ear: Tympanic membrane, ear canal and external ear normal. There is  no impacted cerumen.     Nose: Mucosal edema, congestion and rhinorrhea present. Rhinorrhea is purulent.     Right Nostril: No epistaxis.     Left Nostril: No epistaxis.     Right Sinus: No maxillary sinus tenderness or frontal sinus tenderness.     Left Sinus: No maxillary sinus tenderness or frontal sinus tenderness.     Mouth/Throat:     Mouth: Mucous membranes are moist.     Pharynx: Oropharynx is clear. Uvula midline. No oropharyngeal exudate.  Eyes:     General: Lids are normal. No scleral icterus.       Right eye: No discharge.        Left eye: No discharge.     Conjunctiva/sclera: Conjunctivae normal.  Neck:     Trachea:  Phonation normal. No tracheal deviation.  Cardiovascular:     Rate and Rhythm: Normal rate and regular rhythm.     Pulses: Normal pulses.          Radial pulses are 2+ on the right side and 2+ on the left side.       Posterior tibial pulses are 2+ on the right side and 2+ on the left side.     Heart sounds: Normal heart sounds. No murmur heard.    No friction rub. No gallop.  Pulmonary:     Effort: Pulmonary effort is normal. No respiratory distress.     Breath sounds: Normal breath sounds. No stridor. No wheezing, rhonchi or rales.  Chest:     Chest wall: No tenderness.  Abdominal:     General: Bowel sounds are normal. There is no distension.     Palpations: Abdomen is soft.     Tenderness: There is no abdominal tenderness. There is no guarding or rebound.  Musculoskeletal:     Cervical back: Normal range of motion and neck supple.  Lymphadenopathy:     Cervical: No cervical adenopathy.  Skin:    General: Skin is warm and dry.     Capillary Refill: Capillary refill takes less than 2 seconds.     Coloration: Skin is not pale.     Findings: No rash.  Neurological:     Mental Status: She is alert and oriented to person, place, and time.     Motor: No abnormal muscle tone.     Gait: Gait normal.  Psychiatric:        Mood and Affect: Mood normal.        Speech: Speech normal.        Behavior: Behavior normal. Behavior is cooperative.      Results for orders placed or performed in visit on 01/14/22  Fetal nonstress test  Result Value Ref Range   NST Locations     NST Indications     NST Duration     NST Baseline     FHR Variabilities     Accelerations > 10bpm     Accelerations > 15 BPM     Decelerations     Uterine Activity     Acoustic Stimulation     Assessment     NST Return     NST Read by         Assessment & Plan:   1. 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, likely worse with weather changes, dry air, using indoor wood burning stove Reviewed meds to help manage  sx, intranasal steroids, can try atrovent, encouraged daily antihistamine, increasing mucosal moisture with intranasal saline spray and cool mister, can  try decongestants She had no fever or sinus ttp and sx only worse than baseline for 4 d, can work on supportive and concervative measures and at this time abx are not indicated Encouraged f/up with any acute worsening of pain, pressure, fever or nose bleeds - ipratropium (ATROVENT) 0.03 % nasal spray; Place 2 sprays into both nostrils every 12 (twelve) hours.  Dispense: 30 mL; Refill: 12 - cetirizine (ZYRTEC) 10 MG tablet; Take 1 tablet (10 mg total) by mouth daily.  Dispense: 30 tablet; Refill: 11  2. Epistaxis No evidence of epistaxis - scant episode 2-3 times, no clotted blood, discussed doing CBC but she declined today Plan as above, discussed how to tx nose bleed, afrin rescue if nose bleed does not stop in 10 min, and may need ENT f/up if any increased severity or frequency  Extensive instruction given to pt verbally today with plan and put on AVS for her to access at home.   Pt verbalized understanding and all questions asked and answered    Delsa Grana, PA-C 08/26/22 9:19 AM

## 2022-08-26 NOTE — Patient Instructions (Signed)
Try flonase or the atrovent nose spray, daily antihistamine like you claritin or zyrtec, saline nose spray, and adding moisture to the air with a cool mister Also try sudafed behind the counter  Let me know if you have sudden worsening of facial pain with fever - may need antibiotics Let me know if you have really bothersome symptoms that persist despite the meds and treatment above - more than 10-12 days and you may need antibiotics Or let me know if you have worsening frequency of nose bleeds and I'll refer you to ENT  For nose bleeds see info below - usually hold pressure leaning head forward if more than 10 min bleeing doesn't stop you should try blowing out nose and doing a dose of afrin to each nostril and holding again.  If that still doesn't stop bleeding you need to go to the ER  Nosebleed, Adult A nosebleed is when blood comes out of the nose. Nosebleeds are common and can be caused by many things. They are usually not a sign of a serious medical problem. Follow these instructions at home: When you have a nosebleed:  Sit down. Tilt your head forward a little. Follow these steps: Pinch your nose with a clean towel or tissue. Keep pinching your nose for 5 minutes. Do not let go. After 5 minutes, let go of your nose. Keep doing these steps until the bleeding stops. Do not put tissues or other things in your nose to stop the bleeding. Avoid lying down or putting your head back. Use a nose spray decongestant as told by your doctor. After a nosebleed: Try not to blow your nose or sniffle for several hours. Try not to strain, lift, or bend at the waist for several days. Aspirin and medicines that thin your blood make bleeding more likely. If you take these medicines: Ask your doctor if you should stop taking them or if you should change how much you take. Do not stop taking the medicine unless your doctor tells you to. If your nosebleed was caused by dryness, use over-the-counter  saline nasal spray or gel and a humidifier as told by your doctor. This will keep the inside of your nose moist and allow it to heal. If you need to use nasal spray or gel: Choose one that is water-soluble. Use only as much as you need and use it only as often as needed. Do not lie down right away after you use it. If you get nosebleeds often, talk with your doctor about treatments. These may include: Nasal cautery. A chemical swab or electrical device is used to lightly burn tiny blood vessels inside the nose. This helps stop or prevent nosebleeds. Nasal packing. A gauze or other material is placed in the nose to keep constant pressure on the bleeding area. Contact a doctor if: You have a fever. You get nosebleeds often. You get nosebleeds more often than usual. You bruise very easily. You have something stuck in your nose. You are bleeding in your mouth. You vomit or cough up brown material. You get a nosebleed after you start a new medicine. Get help right away if: You have a nosebleed after you fall or hurt your head. Your nosebleed does not go away after 20 minutes. You feel dizzy or weak. You have unusual bleeding from other parts of your body. You have unusual bruising on other parts of your body. You get sweaty. You vomit blood. Summary Nosebleeds are common. They are usually not a  sign of a serious medical problem. When you have a nosebleed, sit down and tilt your head a little forward. Pinch your nose with a clean tissue for 5 minutes. Use saline spray or saline gel and a humidifier as told by your doctor. Get help right away if your nosebleed does not go away after 20 minutes. This information is not intended to replace advice given to you by your health care provider. Make sure you discuss any questions you have with your health care provider. Document Revised: 10/15/2021 Document Reviewed: 10/15/2021 Elsevier Patient Education  2023 [employer].

## 2022-09-19 DIAGNOSIS — Z419 Encounter for procedure for purposes other than remedying health state, unspecified: Secondary | ICD-10-CM | POA: Diagnosis not present

## 2022-09-29 NOTE — Patient Instructions (Signed)

## 2022-09-30 ENCOUNTER — Ambulatory Visit (INDEPENDENT_AMBULATORY_CARE_PROVIDER_SITE_OTHER): Payer: Medicaid Other | Admitting: Family Medicine

## 2022-09-30 ENCOUNTER — Encounter: Payer: Self-pay | Admitting: Family Medicine

## 2022-09-30 VITALS — BP 132/84 | HR 96 | Temp 98.5°F | Resp 16 | Ht 64.25 in | Wt 190.6 lb

## 2022-09-30 DIAGNOSIS — Z0000 Encounter for general adult medical examination without abnormal findings: Secondary | ICD-10-CM

## 2022-09-30 DIAGNOSIS — Z6832 Body mass index (BMI) 32.0-32.9, adult: Secondary | ICD-10-CM

## 2022-09-30 DIAGNOSIS — E66811 Obesity, class 1: Secondary | ICD-10-CM

## 2022-09-30 DIAGNOSIS — E162 Hypoglycemia, unspecified: Secondary | ICD-10-CM | POA: Diagnosis not present

## 2022-09-30 DIAGNOSIS — E669 Obesity, unspecified: Secondary | ICD-10-CM | POA: Diagnosis not present

## 2022-09-30 NOTE — Progress Notes (Unsigned)
Patient: Susan Klein, Female    DOB: Jul 17, 1996, 26 y.o.   MRN: [id number] Delsa Grana, PA-C Visit Date: 09/30/2022  Today's Provider: Delsa Grana, PA-C   Chief Complaint  Patient presents with   Annual Exam   Subjective:   Annual physical exam:  Susan Klein is a 26 y.o. female who presents today for complete physical exam:  Exercise/Activity:  exercising 3 d a week 20 min  Diet/nutrition:  trying a lot of different diet to loose weight and she cannot get weight down  Sleep:  sleep is ok, maybe a tough few nights here and there but most nights good   SDOH Screenings   Food Insecurity: No Food Insecurity (09/30/2022)  Housing: Low Risk  (09/30/2022)  Transportation Needs: No Transportation Needs (09/30/2022)  Utilities: Not At Risk (09/30/2022)  Alcohol Screen: Low Risk  (09/30/2022)  Depression (PHQ2-9): Low Risk  (09/30/2022)  Financial Resource Strain: Medium Risk (09/30/2022)  Physical Activity: Insufficiently Active (09/30/2022)  Social Connections: Moderately Isolated (09/30/2022)  Stress: Stress Concern Present (09/30/2022)  Tobacco Use: Medium Risk (09/30/2022)      USPSTF grade A and B recommendations - reviewed and addressed today  Depression:  Phq 9 completed today by patient, was reviewed by me with patient in the room PHQ score is neg, pt feels good    09/30/2022   10:47 AM 08/26/2022    8:54 AM 07/01/2022    1:41 PM 06/03/2022    9:57 AM  PHQ 2/9 Scores  PHQ - 2 Score 0 0 2 2  PHQ- 9 Score 0 0 8 6      09/30/2022   10:47 AM 08/26/2022    8:54 AM 07/01/2022    1:41 PM 06/03/2022    9:57 AM 04/29/2021    3:40 PM  Depression screen PHQ 2/9  Decreased Interest 0 0 1 1 0  Down, Depressed, Hopeless 0 0 _0 PHQ - 2 Score 0 0 _1 Altered sleeping 0 0 _2 Tired, decreased energy 0 0 _3 Change in appetite 0 0 0 0 1  Feeling bad or failure about yourself  0 0 0 0 0  Trouble concentrating 0 0 1 1 0  Moving slowly or  fidgety/restless 0 0 1 1 0  Suicidal thoughts 0 0 0 0 0  PHQ-9 Score 0 0 _4 Difficult doing work/chores Not difficult at all Not difficult at all Somewhat difficult Somewhat difficult Not difficult at all    Alcohol screening: [place] Office Visit from 09/30/2022 in [facility]  AUDIT-C Score 0       Immunizations and Health Maintenance: Health Maintenance  Topic Date Due   PAP-Cervical Cytology Screening  12/22/2022   PAP SMEAR-Modifier  12/22/2022   CHLAMYDIA SCREENING  01/14/2023   DTaP/Tdap/Td (8 - Td or Tdap) 10/25/2027   INFLUENZA VACCINE  Completed   HPV VACCINES  Completed   Hepatitis C Screening  Completed   HIV Screening  Completed   COVID-19 Vaccine  Discontinued     Hep C Screening:  done  STD testing and prevention (HIV/chl/gon/syphilis):  see above, no additional testing desired by pt today   Intimate partner violence:  safe   Sexual History/Pain during Intercourse: Significant Other   Menstrual History/LMP/Abnormal Bleeding:  rare spotting No LMP recorded. (Menstrual status: IUD).  Incontinence Symptoms: none  Breast cancer:   Last Mammogram: *see HM list above  BRCA gene screening:   Cervical cancer screening: UTD doing with GYN annually  Pt denies family hx of cancers - breast, ovarian, uterine, colon:     Osteoporosis:   Discussion on osteoporosis per age, including high calcium and vitamin D supplementation, weight bearing exercises N/a due to age  Skin cancer:  Hx of skin CA -  NO Discussed atypical lesions   Colorectal cancer:   Colonoscopy is not due Discussed concerning signs and sx of CRC, pt denies change in bowels, blood in stool  Lung cancer:   Low Dose CT Chest recommended if Age 54-80 years, 20 pack-year currently smoking OR have quit w/in 15years. Patient does not qualify.    Social History   Tobacco Use   Smoking status: Former   Smokeless tobacco: Never  Scientific laboratory technician Use: Former    Substances: Nicotine, Flavoring   Devices: Hyde, has vaped for about 1 yr  Substance Use Topics   Alcohol use: No   Drug use: No     [place] [facility] Visit from 09/30/2022 in [facility]  AUDIT-C Score 0       Family History  Problem Relation Age of Onset   Depression Mother    Heart attack Mother    Hypertension Mother    Gestational diabetes Mother    Heart attack Father    Lung cancer Maternal Uncle    Alzheimer's disease Paternal Grandmother    Diabetes Maternal Grandmother      Blood pressure/Hypertension: BP Readings from Last 3 Encounters:  09/30/22 132/84  08/26/22 124/80  07/01/22 124/80    Weight/Obesity: Wt Readings from Last 3 Encounters:  09/30/22 190 lb 9.6 oz (86.5 kg)  08/26/22 189 lb 3.2 oz (85.8 kg)  07/01/22 185 lb 12.8 oz (84.3 kg)   BMI Readings from Last 3 Encounters:  09/30/22 32.46 kg/m  08/26/22 33.52 kg/m  07/01/22 32.91 kg/m     Lipids:  No results found for: "CHOL" No results found for: "HDL" No results found for: "Parchment" No results found for: "TRIG" No results found for: "CHOLHDL" No results found for: "LDLDIRECT" Based on the results of lipid panel his/her cardiovascular risk factor ( using [place] )  in the next 10 years is: The ASCVD Risk score (Arnett DK, et al., 2019) failed to calculate for the following reasons:   The 2019 ASCVD risk score is only valid for ages 49 to 66  Glucose:  Glucose, Bld  Date Value Ref Range Status  10/22/2020 106 (H) 70 - 99 mg/dL Final    Comment:    Glucose reference range applies only to samples taken after fasting for at least 8 hours.  10/21/2020 85 70 - 99 mg/dL Final    Comment:    Glucose reference range applies only to samples taken after fasting for at least 8 hours.  09/18/2020 68 65 - 99 mg/dL Final    Comment:    .            Fasting reference interval .     Advanced Care Planning:  A voluntary discussion about advance care planning  including the explanation and discussion of advance directives.   Patient does not have a living will at present time.   Social History       Social History   Socioeconomic History   Marital status: Significant Other    Spouse name: Not on file   Number of children: 1   Years of education:  Not on file   Highest education level: Some college, no degree  Occupational History   Occupation: stay at home mom  Tobacco Use   Smoking status: Former   Smokeless tobacco: Never  Vaping Use   Vaping Use: Former   Substances: Nicotine, Flavoring   Devices: Hyde, has vaped for about 1 yr  Substance and Sexual Activity   Alcohol use: No   Drug use: No   Sexual activity: Yes    Partners: Male    Birth control/protection: I.U.D.  Other Topics Concern   Not on file  Social History Narrative   Not on file   Social Determinants of Health   Financial Resource Strain: Medium Risk (09/30/2022)   Overall Financial Resource Strain (CARDIA)    Difficulty of Paying Living Expenses: Somewhat hard  Food Insecurity: No Food Insecurity (09/30/2022)   Hunger Vital Sign    Worried About Running Out of Food in the Last Year: Never true    Ran Out of Food in the Last Year: Never true  Transportation Needs: No Transportation Needs (09/30/2022)   PRAPARE - Hydrologist (Medical): No    Lack of Transportation (Non-Medical): No  Physical Activity: Insufficiently Active (09/30/2022)   Exercise Vital Sign    Days of Exercise per Week: 3 days    Minutes of Exercise per Session: 20 min  Stress: Stress Concern Present (09/30/2022)   [place]    Feeling of Stress : Very much  Social Connections: Moderately Isolated (09/30/2022)   Social Connection and Isolation Panel [NHANES]    Frequency of Communication with Friends and Family: Three times a week    Frequency of Social Gatherings with Friends and Family: Once  a week    Attends Religious Services: Never    Marine scientist or Organizations: No    Attends Music therapist: Never    Marital Status: Living with partner    Family History        Family History  Problem Relation Age of Onset   Depression Mother    Heart attack Mother    Hypertension Mother    Gestational diabetes Mother    Heart attack Father    Lung cancer Maternal Uncle    Alzheimer's disease Paternal Grandmother    Diabetes Maternal Grandmother     Patient Active Problem List   Diagnosis Date Noted   Class 1 obesity with body mass index (BMI) of 32.0 to 32.9 in adult 03/22/2020   Cold sensitivity 11/16/2019   Moderate episode of recurrent major depressive disorder ([place]) 01/06/2019   Irritable bowel syndrome with constipation 01/06/2019   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    Past Surgical History:  Procedure Laterality Date   KNEE ARTHROSCOPY WITH ANTERIOR CRUCIATE LIGAMENT (ACL) REPAIR WITH HAMSTRING GRAFT Left 03/12/2021   Procedure: KNEE ARTHROSCOPY WITH ANTERIOR CRUCIATE LIGAMENT (ACL) REPAIR WITH HAMSTRING GRAFT;  Surgeon: Thornton Park, MD;  Location: [facility];  Service: Orthopedics;  Laterality: Left;   TONSILLECTOMY     TONSILLECTOMY AND ADENOIDECTOMY Bilateral 12/28/2018   Procedure: TONSILLECTOMY;  Surgeon: Clyde Canterbury, MD;  Location: [facility];  Service: ENT;  Laterality: Bilateral;  Faxed over consent, physicians orders, and H&P/klp     Current Outpatient Medications:    cetirizine (ZYRTEC) 10 MG tablet, Take 1 tablet (10 mg total) by mouth daily., Disp: 30 tablet, Rfl: 11   FLUoxetine (PROZAC) 40 MG capsule, Take 1  capsule (40 mg total) by mouth daily., Disp: 90 capsule, Rfl: 3   ipratropium (ATROVENT) 0.03 % nasal spray, Place 2 sprays into both nostrils every 12 (twelve) hours., Disp: 30 mL, Rfl: 12   levonorgestrel (LILETTA) 19.5 MCG/DAY IUD IUD, 1 each by Intrauterine route once. , Disp: , Rfl:    LORazepam  (ATIVAN) 0.5 MG tablet, Take 1 tablet (0.5 mg total) by mouth every 8 (eight) hours., Disp: 10 tablet, Rfl: 0  Allergies  Allergen Reactions   Mucinex [Guaifenesin Er] Hives    Patient Care Team: Delsa Grana, PA-C as PCP - General (Family Medicine)   Chart Review: I personally reviewed active problem list, medication list, allergies, family history, social history, health maintenance, notes from last encounter, lab results, imaging with the patient/caregiver today.   Review of Systems  Constitutional: Negative.   HENT: Negative.    Eyes: Negative.   Respiratory: Negative.    Cardiovascular: Negative.   Gastrointestinal: Negative.   Endocrine: Negative.   Genitourinary: Negative.   Musculoskeletal: Negative.   Skin: Negative.   Allergic/Immunologic: Negative.   Neurological: Negative.   Hematological: Negative.   Psychiatric/Behavioral: Negative.    All other systems reviewed and are negative.         Objective:   Vitals:  Vitals:   09/30/22 1054  BP: 132/84  Pulse: 96  Resp: 16  Temp: 98.5 F (36.9 C)  TempSrc: Oral  SpO2: 98%  Weight: 190 lb 9.6 oz (86.5 kg)  Height: 5' 4.25" (1.632 m)    Body mass index is 32.46 kg/m.  Physical Exam Vitals and nursing note reviewed.  Constitutional:      General: She is not in acute distress.    Appearance: Normal appearance. She is well-developed. She is obese. She is not ill-appearing, toxic-appearing or diaphoretic.  HENT:     Head: Normocephalic and atraumatic.     Right Ear: External ear normal.     Left Ear: External ear normal.     Nose: Nose normal.     Mouth/Throat:     Pharynx: Oropharynx is clear. Uvula midline. No oropharyngeal exudate or posterior oropharyngeal erythema.  Eyes:     General: Lids are normal. No scleral icterus.       Right eye: No discharge.        Left eye: No discharge.     Conjunctiva/sclera: Conjunctivae normal.  Neck:     Thyroid: No thyroid mass, thyromegaly or thyroid  tenderness.     Trachea: Trachea and phonation normal. No tracheal deviation.  Cardiovascular:     Rate and Rhythm: Normal rate and regular rhythm.     Pulses: Normal pulses.          Radial pulses are 2+ on the right side and 2+ on the left side.       Posterior tibial pulses are 2+ on the right side and 2+ on the left side.     Heart sounds: Normal heart sounds. No murmur heard.    No friction rub. No gallop.  Pulmonary:     Effort: Pulmonary effort is normal. No respiratory distress.     Breath sounds: Normal breath sounds. No stridor. No wheezing, rhonchi or rales.  Chest:     Chest wall: No tenderness.  Abdominal:     General: Bowel sounds are normal. There is no distension.     Palpations: Abdomen is soft.     Tenderness: There is no abdominal tenderness. There is no guarding or rebound.  Musculoskeletal:        General: No deformity.     Cervical back: Normal range of motion and neck supple.     Right lower leg: No edema.     Left lower leg: No edema.  Lymphadenopathy:     Cervical: No cervical adenopathy.  Skin:    General: Skin is warm and dry.     Capillary Refill: Capillary refill takes less than 2 seconds.     Coloration: Skin is not pale.     Findings: No rash.  Neurological:     Mental Status: She is alert. Mental status is at baseline.     Motor: No abnormal muscle tone.     Gait: Gait normal.  Psychiatric:        Speech: Speech normal.        Behavior: Behavior normal.       Fall Risk:    09/30/2022   10:44 AM 08/26/2022    8:54 AM 07/01/2022    1:40 PM 06/03/2022    9:57 AM 04/29/2021    3:36 PM  Fall Risk   Falls in the past year? 1 0 0 0 1  Number falls in past yr: 0 0 0 0 0  Injury with Fall? 0 0 0 0 1  Risk for fall due to : Impaired balance/gait No Fall Risks No Fall Risks No Fall Risks   Follow up Falls prevention discussed;Education provided;Falls evaluation completed Falls evaluation completed;Education provided;Falls prevention discussed  Falls prevention discussed;Education provided Falls prevention discussed;Education provided     Functional Status Survey: Is the patient deaf or have difficulty hearing?: No Does the patient have difficulty seeing, even when wearing glasses/contacts?: Yes Does the patient have difficulty concentrating, remembering, or making decisions?: Yes Does the patient have difficulty walking or climbing stairs?: No Does the patient have difficulty dressing or bathing?: No Does the patient have difficulty doing errands alone such as visiting a doctor's office or shopping?: No   Assessment & Plan:    CPE completed today  USPSTF grade A and B recommendations reviewed with patient; age-appropriate recommendations, preventive care, screening tests, etc discussed and encouraged; healthy living encouraged; see AVS for patient education given to patient  Discussed importance of 150 minutes of physical activity weekly, AHA exercise recommendations given to pt in AVS/handout  Discussed importance of healthy diet:  eating lean meats and proteins, avoiding trans fats and saturated fats, avoid simple sugars and excessive carbs in diet, eat 6 servings of fruit/vegetables daily and drink plenty of water and avoid sweet beverages.    Recommended pt to do annual eye exam and routine dental exams/cleanings  Depression, alcohol, fall screening completed as documented above and per flowsheets  Advance Care planning information and packet discussed and offered today, encouraged pt to discuss with family members/spouse/partner/friends and complete Advanced directive packet and bring copy to office   Reviewed Health Maintenance: Health Maintenance  Topic Date Due   PAP-Cervical Cytology Screening  12/22/2022   PAP SMEAR-Modifier  12/22/2022   CHLAMYDIA SCREENING  01/14/2023   DTaP/Tdap/Td (8 - Td or Tdap) 10/25/2027   INFLUENZA VACCINE  Completed   HPV VACCINES  Completed   Hepatitis C Screening  Completed   HIV  Screening  Completed   COVID-19 Vaccine  Discontinued    Immunizations: Immunization History  Administered Date(s) Administered   DTaP 12/14/1998, 03/04/1999, 07/15/1999, 01/14/2000, 04/18/2004   HIB (PRP-OMP) 12/14/1998, 03/04/1999, 07/15/1999, 01/14/2000   Hepatitis A 06/03/2010, 11/24/2011   Hepatitis B  07/15/1999, 01/14/2000, 04/13/2000   Hpv-Unspecified 06/03/2010, 11/24/2011, 01/13/2013   IPV 12/14/1998, 03/04/1999, 10/16/1999, 04/18/2004   Influenza,inj,Quad PF,6+ Mos 08/06/2017, 07/19/2019, 08/10/2020, 07/01/2022   MMR 10/16/1999, 04/18/2004   MenQuadfi_Meningococcal Groups ACYW Conjugate 06/03/2010, 07/27/2015   Pneumococcal-Unspecified 10/16/1999, 01/14/2000   Td 06/03/2010   Tdap 10/24/2017   Varicella 10/16/1999, 06/03/2010   Vaccines:  HPV: up to at age 46 , ask insurance if age between 13-45  Shingrix: 15-64 yo and ask insurance if covered when patient above 62 yo Pneumonia: not indicated educated and discussed with patient. Flu: done  COVID:  briefly discussed     ICD-10-CM   1. Annual physical exam  V85.92 COMPLETE METABOLIC PANEL WITH GFR    CBC with Differential/Platelet    Lipid panel    TSH    Hemoglobin A1c    2. Class 1 obesity with body mass index (BMI) of 32.0 to 32.9 in adult, unspecified obesity type, unspecified whether serious comorbidity present  E66.9    Z68.32      Some low blood sugar episodes at home with glucometer, symptomatic 65 lowest Burning and SOB with working out after several weeks the SOB limited her from continuing to go   Moods ok - continue prozac same dose   can refer to dietician for hypoglycemia   Delsa Grana, PA-C 09/30/22 11:00 AM  [facility]

## 2022-10-01 LAB — HEMOGLOBIN A1C
Hgb A1c MFr Bld: 5.3 % of total Hgb (ref <5.7)
Mean Plasma Glucose: 105 mg/dL
eAG (mmol/L): 5.8 mmol/L

## 2022-10-01 LAB — COMPLETE METABOLIC PANEL WITH GFR
AG Ratio: 2 (calc) (ref 1.0–2.5)
ALT: 40 U/L — ABNORMAL HIGH (ref 6–29)
AST: 28 U/L (ref 10–30)
Albumin: 4.6 g/dL (ref 3.6–5.1)
Alkaline phosphatase (APISO): 65 U/L (ref 31–125)
BUN: 9 mg/dL (ref 7–25)
CO2: 24 mmol/L (ref 20–32)
Calcium: 9.8 mg/dL (ref 8.6–10.2)
Chloride: 106 mmol/L (ref 98–110)
Creat: 0.64 mg/dL (ref 0.50–0.96)
Globulin: 2.3 g/dL (calc) (ref 1.9–3.7)
Glucose, Bld: 70 mg/dL (ref 65–99)
Potassium: 4.2 mmol/L (ref 3.5–5.3)
Sodium: 141 mmol/L (ref 135–146)
Total Bilirubin: 0.5 mg/dL (ref 0.2–1.2)
Total Protein: 6.9 g/dL (ref 6.1–8.1)
eGFR: 127 mL/min/{1.73_m2} (ref >=60)

## 2022-10-01 LAB — CBC WITH DIFFERENTIAL/PLATELET
Absolute Monocytes: 518 cells/uL (ref 200–950)
Basophils Absolute: 29 cells/uL (ref 0–200)
Basophils Relative: 0.4 %
Eosinophils Absolute: 161 cells/uL (ref 15–500)
Eosinophils Relative: 2.2 %
HCT: 40.8 % (ref 35.0–45.0)
Hemoglobin: 13.8 g/dL (ref 11.7–15.5)
Lymphs Abs: 2387 cells/uL (ref 850–3900)
MCH: 30.3 pg (ref 27.0–33.0)
MCHC: 33.8 g/dL (ref 32.0–36.0)
MCV: 89.7 fL (ref 80.0–100.0)
MPV: 10.1 fL (ref 7.5–12.5)
Monocytes Relative: 7.1 %
Neutro Abs: 4205 cells/uL (ref 1500–7800)
Neutrophils Relative %: 57.6 %
Platelets: 229 10*3/uL (ref 140–400)
RBC: 4.55 10*6/uL (ref 3.80–5.10)
RDW: 12.1 % (ref 11.0–15.0)
Total Lymphocyte: 32.7 %
WBC: 7.3 10*3/uL (ref 3.8–10.8)

## 2022-10-01 LAB — LIPID PANEL
Cholesterol: 250 mg/dL — ABNORMAL HIGH (ref <200)
HDL: 55 mg/dL (ref >=50)
LDL Cholesterol (Calc): 157 mg/dL (calc) — ABNORMAL HIGH
Non-HDL Cholesterol (Calc): 195 mg/dL (calc) — ABNORMAL HIGH (ref <130)
Total CHOL/HDL Ratio: 4.5 (calc) (ref <5.0)
Triglycerides: 225 mg/dL — ABNORMAL HIGH (ref <150)

## 2022-10-01 LAB — TSH: TSH: 2.17 mIU/L

## 2022-10-20 DIAGNOSIS — Z419 Encounter for procedure for purposes other than remedying health state, unspecified: Secondary | ICD-10-CM | POA: Diagnosis not present

## 2022-11-20 DIAGNOSIS — Z419 Encounter for procedure for purposes other than remedying health state, unspecified: Secondary | ICD-10-CM | POA: Diagnosis not present

## 2022-11-21 ENCOUNTER — Telehealth: Payer: Medicaid Other | Admitting: Physician Assistant

## 2022-11-21 DIAGNOSIS — J069 Acute upper respiratory infection, unspecified: Secondary | ICD-10-CM

## 2022-11-21 MED ORDER — BENZONATATE 100 MG PO CAPS: 100.0000 mg | ORAL_CAPSULE | Freq: Three times a day (TID) | ORAL | 0 refills | Status: DC | PRN

## 2022-11-21 MED ORDER — FLUTICASONE PROPIONATE 50 MCG/ACT NA SUSP: 2.0000 | Freq: Every day | NASAL | 0 refills | Status: DC

## 2022-11-21 NOTE — Progress Notes (Signed)
E-Visit for Upper Respiratory Infection   We are sorry you are not feeling well.  Here is how we plan to help!  Based on what you have shared with me, it looks like you may have a viral upper respiratory infection.  Upper respiratory infections are caused by a large number of viruses; however, rhinovirus is the most common cause.   Symptoms vary from person to person, with common symptoms including sore throat, cough, fatigue or lack of energy and feeling of general discomfort.  A low-grade fever of up to 100.4 may present, but is often uncommon.  Symptoms vary however, and are closely related to a person's age or underlying illnesses.  The most common symptoms associated with an upper respiratory infection are nasal discharge or congestion, cough, sneezing, headache and pressure in the ears and face.  These symptoms usually persist for about 3 to 10 days, but can last up to 2 weeks.  It is important to know that upper respiratory infections do not cause serious illness or complications in most cases.    Upper respiratory infections can be transmitted from person to person, with the most common method of transmission being a person's hands.  The virus is able to live on the skin and can infect other persons for up to 2 hours after direct contact.  Also, these can be transmitted when someone coughs or sneezes; thus, it is important to cover the mouth to reduce this risk.  To keep the spread of the illness at bay, good hand hygiene is very important.  This is an infection that is most likely caused by a virus. There are no specific treatments other than to help you with the symptoms until the infection runs its course.  We are sorry you are not feeling well.  Here is how we plan to help!   For nasal congestion, you may use an oral decongestants such as Mucinex D or if you have glaucoma or high blood pressure use plain Mucinex.  Saline nasal spray or nasal drops can help and can safely be used as often as  needed for congestion.  For your congestion, I have prescribed Fluticasone nasal spray one spray in each nostril twice a day  If you do not have a history of heart disease, hypertension, diabetes or thyroid disease, prostate/bladder issues or glaucoma, you may also use Sudafed to treat nasal congestion.  It is highly recommended that you consult with a pharmacist or your primary care physician to ensure this medication is safe for you to take.     If you have a cough, you may use cough suppressants such as Delsym and Robitussin.  If you have glaucoma or high blood pressure, you can also use Coricidin HBP.   For cough I have prescribed for you A prescription cough medication called Tessalon Perles 100 mg. You may take 1-2 capsules every 8 hours as needed for cough  If you have a sore or scratchy throat, use a saltwater gargle-  to  teaspoon of salt dissolved in a 4-ounce to 8-ounce glass of warm water.  Gargle the solution for approximately 15-30 seconds and then spit.  It is important not to swallow the solution.  You can also use throat lozenges/cough drops and Chloraseptic spray to help with throat pain or discomfort.  Warm or cold liquids can also be helpful in relieving throat pain.  For headache, pain or general discomfort, you can use Ibuprofen or Tylenol as directed.   Some authorities believe   that zinc sprays or the use of Echinacea may shorten the course of your symptoms.   HOME CARE Only take medications as instructed by your medical team. Be sure to drink plenty of fluids. Water is fine as well as fruit juices, sodas and electrolyte beverages. You may want to stay away from caffeine or alcohol. If you are nauseated, try taking small sips of liquids. How do you know if you are getting enough fluid? Your urine should be a pale yellow or almost colorless. Get rest. Taking a steamy shower or using a humidifier may help nasal congestion and ease sore throat pain. You can place a towel over  your head and breathe in the steam from hot water coming from a faucet. Using a saline nasal spray works much the same way. Cough drops, hard candies and sore throat lozenges may ease your cough. Avoid close contacts especially the very young and the elderly Cover your mouth if you cough or sneeze Always remember to wash your hands.   GET HELP RIGHT AWAY IF: You develop worsening fever. If your symptoms do not improve within 10 days You develop yellow or green discharge from your nose over 3 days. You have coughing fits You develop a severe head ache or visual changes. You develop shortness of breath, difficulty breathing or start having chest pain Your symptoms persist after you have completed your treatment plan  MAKE SURE YOU  Understand these instructions. Will watch your condition. Will get help right away if you are not doing well or get worse.  Thank you for choosing an e-visit.  Your e-visit answers were reviewed by a board certified advanced clinical practitioner to complete your personal care plan. Depending upon the condition, your plan could have included both over the counter or prescription medications.  Please review your pharmacy choice. Make sure the pharmacy is open so you can pick up prescription now. If there is a problem, you may contact your provider through MyChart messaging and have the prescription routed to another pharmacy.  Your safety is important to us. If you have drug allergies check your prescription carefully.   For the next 24 hours you can use MyChart to ask questions about today's visit, request a non-urgent call back, or ask for a work or school excuse. You will get an email in the next two days asking about your experience. I hope that your e-visit has been valuable and will speed your recovery.  I have spent 5 minutes in review of e-visit questionnaire, review and updating patient chart, medical decision making and response to patient.    Ziyah Cordoba M Madilyn Cephas, PA-C  

## 2022-11-24 ENCOUNTER — Encounter: Payer: Self-pay | Admitting: Nurse Practitioner

## 2022-11-24 ENCOUNTER — Ambulatory Visit: Payer: Medicaid Other | Admitting: Nurse Practitioner

## 2022-11-24 VITALS — BP 122/68 | HR 105 | Temp 97.8°F | Resp 16 | Ht 64.25 in | Wt 191.8 lb

## 2022-11-24 DIAGNOSIS — J029 Acute pharyngitis, unspecified: Secondary | ICD-10-CM

## 2022-11-24 DIAGNOSIS — B379 Candidiasis, unspecified: Secondary | ICD-10-CM

## 2022-11-24 DIAGNOSIS — T3695XA Adverse effect of unspecified systemic antibiotic, initial encounter: Secondary | ICD-10-CM | POA: Diagnosis not present

## 2022-11-24 LAB — POCT RAPID STREP A (OFFICE): Rapid Strep A Screen: NEGATIVE

## 2022-11-24 MED ORDER — FLUCONAZOLE 150 MG PO TABS: 150.0000 mg | ORAL_TABLET | ORAL | 0 refills | Status: DC | PRN

## 2022-11-24 MED ORDER — AMOXICILLIN-POT CLAVULANATE 875-125 MG PO TABS: 1.0000 | ORAL_TABLET | Freq: Two times a day (BID) | ORAL | 0 refills | Status: DC

## 2022-11-24 NOTE — Progress Notes (Signed)
BP 122/68   Pulse (!) 105   Temp 97.8 F (36.6 C) (Oral)   Resp 16   Ht 5' 4.25" (1.632 m)   Wt 191 lb 12.8 oz (87 kg)   SpO2 98%   BMI 32.67 kg/m    Subjective:    Patient ID: Susan Klein, female    DOB: 1996-01-17, 27 y.o.   MRN: [id number]  HPI: Susan Klein is a 27 y.o. female  Chief Complaint  Patient presents with   Sore Throat    Onset for a week   Sore throat:  she says she has had a sore throat for about 9 days. She says she did have other symptoms that included nasal congestion and cough.  She says those have gone away but she still has the sore throat. She denies any fever, she is able to control her secretions.   She says she has not been taking anything for symptoms. Recommend taking zyrtec, flonase, vitamin d, vitamin c, and zinc. Push fluids and get rest. Can gargle with salt water, drink hot tea with honey, use throat lozenges.    On exam patient's throat is erythematous with exudate. She also had some lymphadenopathy.  Patient did have a negative strep but will treat empirically.   Relevant past medical, surgical, family and social history reviewed and updated as indicated. Interim medical history since our last visit reviewed. Allergies and medications reviewed and updated.  Review of Systems  Constitutional: Negative for fever or weight change.  HEENT: positive for sore throat Respiratory: Negative for cough and shortness of breath.   Cardiovascular: Negative for chest pain or palpitations.  Gastrointestinal: Negative for abdominal pain, no bowel changes.  Musculoskeletal: Negative for gait problem or joint swelling.  Skin: Negative for rash.  Neurological: Negative for dizziness or headache.  No other specific complaints in a complete review of systems (except as listed in HPI above).      Objective:    BP 122/68   Pulse (!) 105   Temp 97.8 F (36.6 C) (Oral)   Resp 16   Ht 5' 4.25" (1.632 m)   Wt 191 lb 12.8 oz (87 kg)   SpO2 98%   BMI  32.67 kg/m   Wt Readings from Last 3 Encounters:  11/24/22 191 lb 12.8 oz (87 kg)  09/30/22 190 lb 9.6 oz (86.5 kg)  08/26/22 189 lb 3.2 oz (85.8 kg)    Physical Exam  Constitutional: Patient appears well-developed and well-nourished. Obese  No distress.  HEENT: head atraumatic, normocephalic, pupils equal and reactive to light, neck supple, throat erythematous with exudate, lymphadenopathy present Cardiovascular: Normal rate, regular rhythm and normal heart sounds.  No murmur heard. No BLE edema. Pulmonary/Chest: Effort normal and breath sounds normal. No respiratory distress. Abdominal: Soft.  There is no tenderness. Psychiatric: Patient has a normal mood and affect. behavior is normal. Judgment and thought content normal.  Results for orders placed or performed in visit on 11/24/22  POCT rapid strep A  Result Value Ref Range   Rapid Strep A Screen Negative Negative      Assessment & Plan:   Problem List Items Addressed This Visit   None Visit Diagnoses     Sore throat    -  Primary   start augmentin, can gargle with salt water, spoonful of honey,Recommend taking zyrtec, flonase,   Relevant Medications   amoxicillin-clavulanate (AUGMENTIN) 875-125 MG tablet   Other Relevant Orders   POCT rapid strep A (Completed)  Antibiotic-induced yeast infection       diflucan sent in due to concern of yeast infection with antibiotics.   Relevant Medications   fluconazole (DIFLUCAN) 150 MG tablet        Follow up plan: Return if symptoms worsen or fail to improve.

## 2022-12-04 IMAGING — DX DG KNEE COMPLETE 4+V*L*
4 series · 4 of 4 positions shown · non-contrast
Comparison: None.

CLINICAL DATA: Trampoline injury

EXAM:
LEFT KNEE - COMPLETE 4+ VIEW

[knee ap]
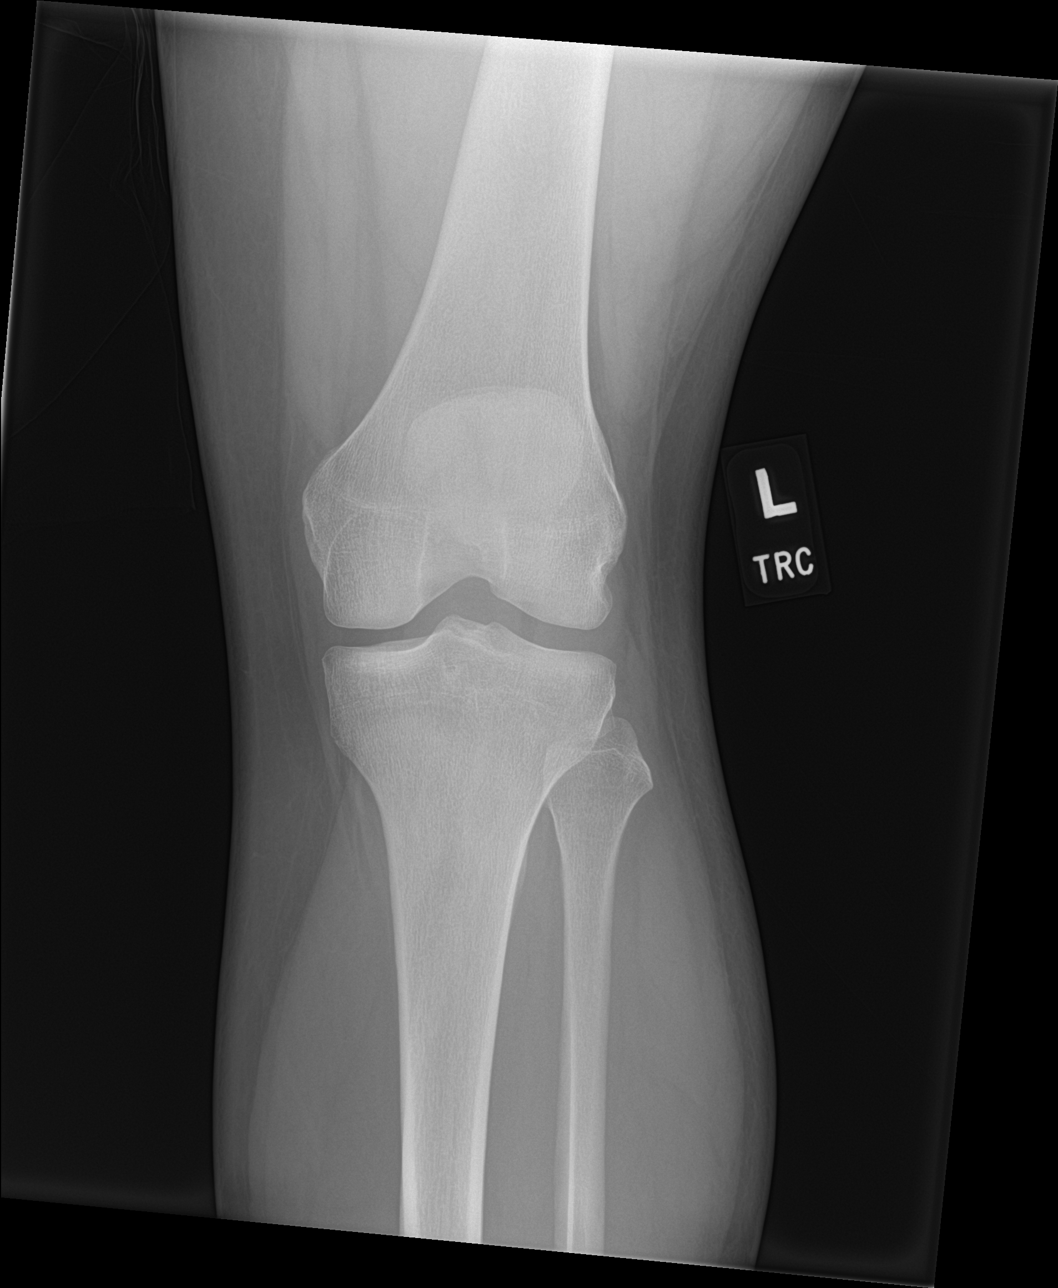

[knee obl (1 of 2)]
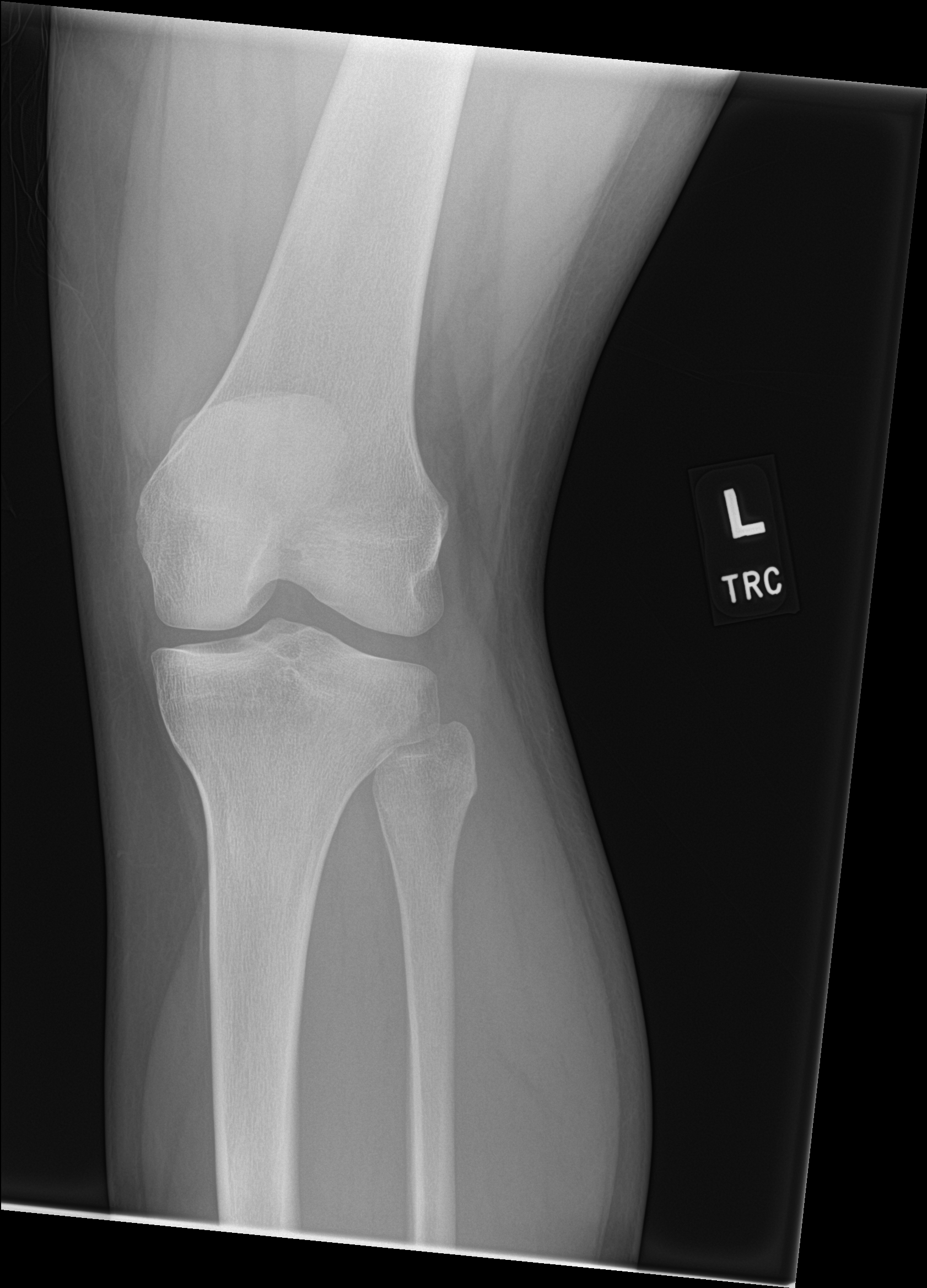

[knee obl (2 of 2)]
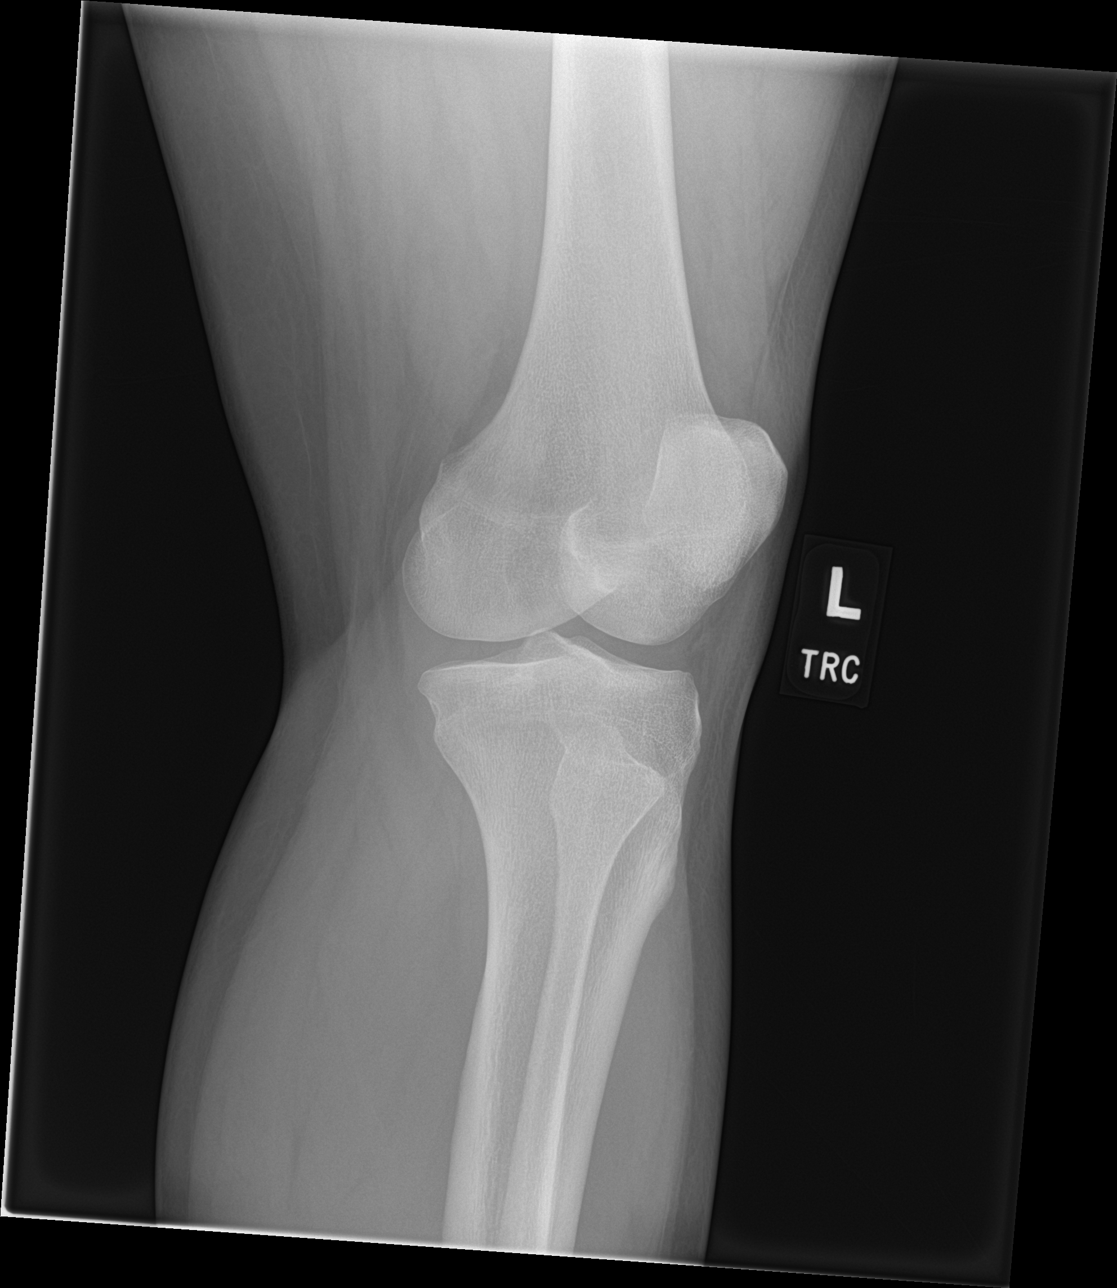

[knee lat]
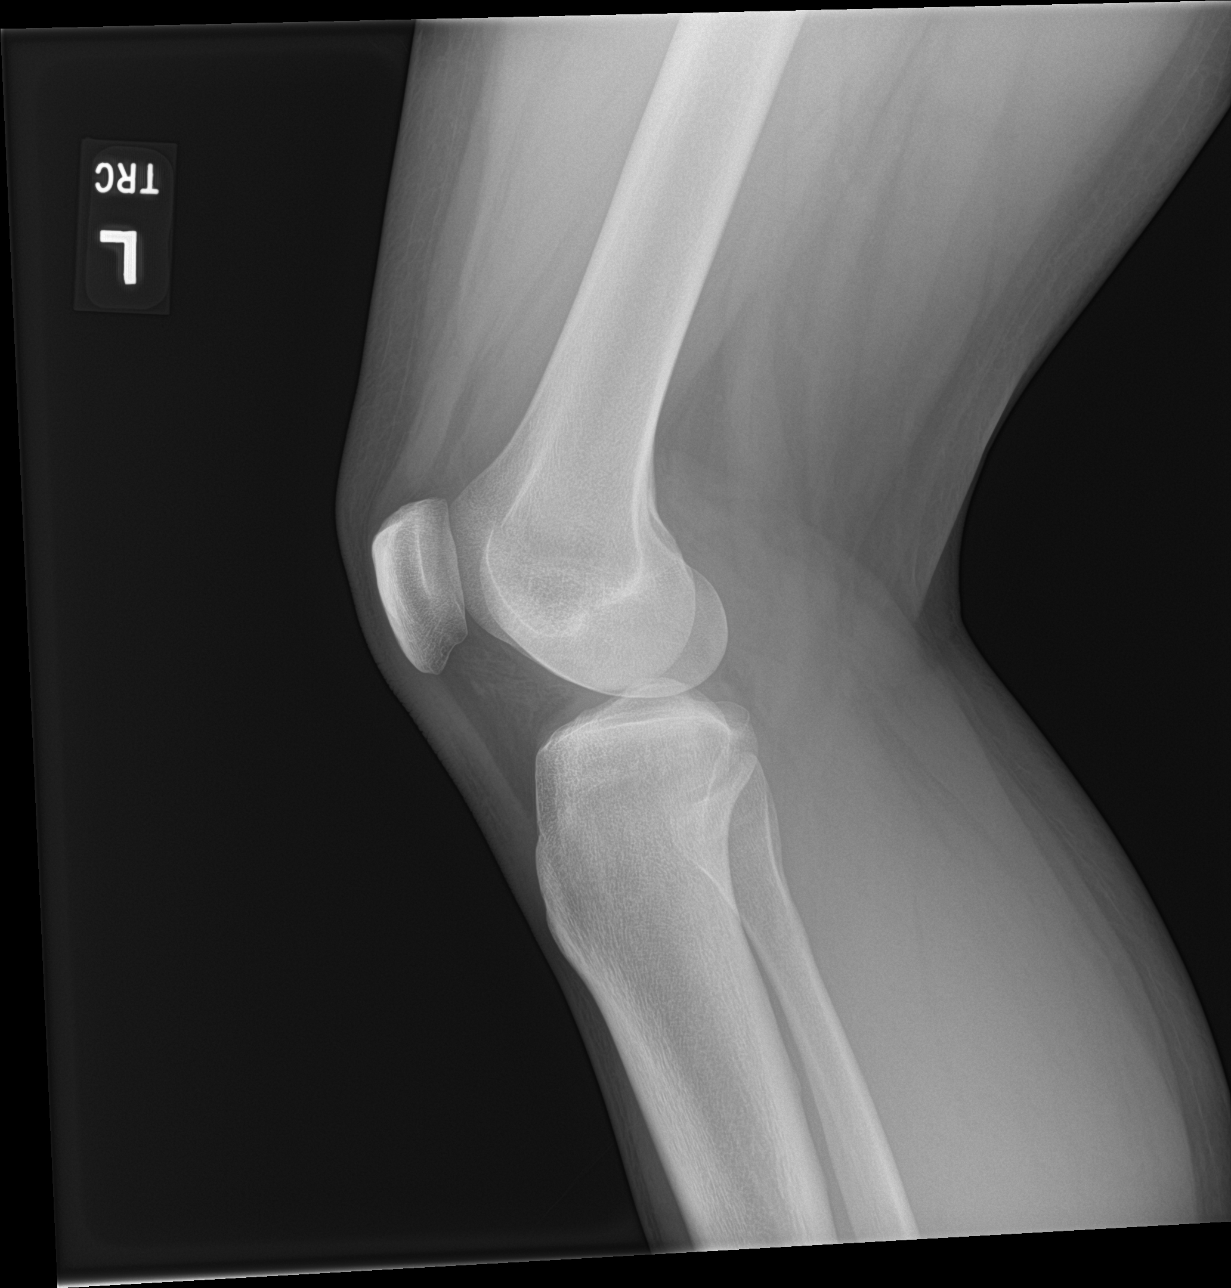

[4 of 4 positions shown; findings below may reference images not displayed]

FINDINGS: No evidence of fracture, dislocation. Small knee joint effusion is
seen. There is edema seen within Hoffa's fat pad. No evidence of
arthropathy or other focal bone abnormality.
IMPRESSION: No acute osseous abnormality

Small knee joint effusion

## 2022-12-19 DIAGNOSIS — Z419 Encounter for procedure for purposes other than remedying health state, unspecified: Secondary | ICD-10-CM | POA: Diagnosis not present

## 2022-12-22 LAB — LIPID PANEL
Cholesterol: 270 — AB (ref 0–200)
LDL Cholesterol: 163
Triglycerides: 258 — AB (ref 40–160)

## 2022-12-22 LAB — BASIC METABOLIC PANEL: Creatinine: 0.5 (ref 0.5–1.1)

## 2022-12-24 ENCOUNTER — Ambulatory Visit (INDEPENDENT_AMBULATORY_CARE_PROVIDER_SITE_OTHER): Payer: Medicaid Other | Admitting: Obstetrics & Gynecology

## 2022-12-24 ENCOUNTER — Encounter: Payer: Self-pay | Admitting: Obstetrics & Gynecology

## 2022-12-24 ENCOUNTER — Other Ambulatory Visit (HOSPITAL_COMMUNITY)
Admission: RE | Admit: 2022-12-24 | Discharge: 2022-12-24 | Disposition: A | Discharge status: Home / Self Care | Payer: Medicaid Other | Source: Ambulatory Visit | Attending: Obstetrics & Gynecology | Admitting: Obstetrics & Gynecology

## 2022-12-24 VITALS — BP 110/70 | Ht 63.0 in | Wt 191.0 lb

## 2022-12-24 DIAGNOSIS — Z124 Encounter for screening for malignant neoplasm of cervix: Secondary | ICD-10-CM

## 2022-12-24 DIAGNOSIS — Z113 Encounter for screening for infections with a predominantly sexual mode of transmission: Secondary | ICD-10-CM

## 2022-12-24 DIAGNOSIS — Z01419 Encounter for gynecological examination (general) (routine) without abnormal findings: Secondary | ICD-10-CM | POA: Diagnosis not present

## 2022-12-24 NOTE — Progress Notes (Signed)
Subjective:    Susan Klein is a 27 y.o. engaged P29 (42 yo daughter) who presents for an annual exam. The patient has no complaints today. The patient is sexually active. GYN screening history: last pap: was normal. The patient wears seatbelts: yes. The patient participates in regular exercise: yes. Has the patient ever been transfused or tattooed?: yes. The patient reports that there is not domestic violence in her life.  She has a Liletta IUD, has occasional periods, no complaints. Menstrual History: OB History     Gravida  1   Para  1   Term  1   Preterm  0   AB  0   Living  1      SAB  0   IAB  0   Ectopic  0   Multiple  0   Live Births  1           No LMP recorded. (Menstrual status: IUD). Period Pattern: (!) Irregular Menstrual Flow: Heavy, Light  The following portions of the patient's history were reviewed and updated as appropriate: allergies, current medications, past family history, past medical history, past social history, past surgical history, and problem list.  Review of Systems Pertinent items are noted in HPI.  No FH of breast, gyn, colon cancer   Objective:    BP 110/70   Ht '5\' 3"'$  (1.6 m)   Wt 191 lb (86.6 kg)   BMI 33.83 kg/m   General Appearance:    Alert, cooperative, no distress, appears stated age  Head:    Normocephalic, without obvious abnormality, atraumatic  Eyes:    PERRL, conjunctiva/corneas clear, EOM's intact, fundi    benign, both eyes  Ears:    Normal TM's and external ear canals, both ears  Nose:   Nares normal, septum midline, mucosa normal, no drainage    or sinus tenderness  Throat:   Lips, mucosa, and tongue normal; teeth and gums normal  Neck:   Supple, symmetrical, trachea midline, no adenopathy;    thyroid:  no enlargement/tenderness/nodules; no carotid   bruit or JVD  Back:     Symmetric, no curvature, ROM normal, no CVA tenderness  Lungs:     Clear to auscultation bilaterally, respirations unlabored  Chest  Wall:    No tenderness or deformity   Heart:    Regular rate and rhythm, S1 and S2 normal, no murmur, rub   or gallop  Breast Exam:    No tenderness, masses, or nipple abnormality  Abdomen:     Soft, non-tender, bowel sounds active all four quadrants,    no masses, no organomegaly  Genitalia:    Normal female without lesion, discharge or tenderness, Liletta string about 2 cm long, normal size and shape, midplane uterus, normal adnexal exam      Extremities:   Extremities normal, atraumatic, no cyanosis or edema  Pulses:   2+ and symmetric all extremities  Skin:   Skin color, texture, turgor normal, no rashes or lesions  Lymph nodes:   Cervical, supraclavicular, and axillary nodes normal  Neurologic:   CNII-XII intact, normal strength, sensation and reflexes    throughout  .    Assessment:    Healthy female exam.    Plan:     Thin prep Pap smear. With reflex HPV testing GC/CT per standard of care practice

## 2022-12-26 ENCOUNTER — Encounter: Payer: Self-pay | Admitting: Family Medicine

## 2022-12-26 LAB — CYTOLOGY - PAP
Chlamydia: NEGATIVE
Comment: NEGATIVE
Comment: NORMAL
Diagnosis: NEGATIVE
Neisseria Gonorrhea: NEGATIVE

## 2023-01-01 NOTE — Progress Notes (Signed)
Name: Susan Klein   MRN: [id number]    DOB: 1996/03/27   Date:01/01/2023       Progress Note  Subjective:    I connected with  Susan Klein  on 01/01/23 at  9:40 AM EDT by a video enabled telemedicine application and verified that I am speaking with the correct person using two identifiers.  I discussed the limitations of evaluation and management by telemedicine and the availability of in person appointments. The patient expressed understanding and agreed to proceed. Staff also discussed with the patient that there may be a patient responsible charge related to this service. Patient Location: home Provider Location: home office Additional Individuals present: none  Chief Complaint  Chief Complaint  Patient presents with   Results    Discuss labs from job   Pt was concerned about screeing labs done by employer - send in a screen shot of a few of them - lipids elevated, trigs high and GGT  Lab Results  Component Value Date   CHOL 270 (A) 12/22/2022   HDL 55 09/30/2022   LDLCALC 163 12/22/2022   TRIG 258 (A) 12/22/2022   CHOLHDL 4.5 09/30/2022   Lab Results  Component Value Date   HGBA1C 5.3 09/30/2022     Chemistry      Component Value Date/Time   NA 141 09/30/2022 1137   NA 138 05/19/2017 1617   K 4.2 09/30/2022 1137   CL 106 09/30/2022 1137   CO2 24 09/30/2022 1137   BUN 9 09/30/2022 1137   BUN 6 05/19/2017 1617   CREATININE 0.5 12/22/2022 0000   CREATININE 0.64 09/30/2022 1137      Component Value Date/Time   CALCIUM 9.8 09/30/2022 1137   ALKPHOS 67 10/22/2020 1234   AST 28 09/30/2022 1137   ALT 40 (H) 09/30/2022 1137   BILITOT 0.5 09/30/2022 1137   BILITOT <0.2 05/19/2017 1617     She reports hx of IBS but no colicky pain with eating  She has started to work on diet/lifestyle efforts  She continues to feel very fatigued and exhaused, trying to take some vitamins    Patient Active Problem List   Diagnosis Date Noted   Class 1 obesity with body  mass index (BMI) of 32.0 to 32.9 in adult 03/22/2020   Cold sensitivity 11/16/2019   Moderate episode of recurrent major depressive disorder ([place]) 01/06/2019   Irritable bowel syndrome with constipation 01/06/2019   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    Social History   Tobacco Use   Smoking status: Former   Smokeless tobacco: Never  Substance Use Topics   Alcohol use: No     Current Outpatient Medications:    amoxicillin-clavulanate (AUGMENTIN) 875-125 MG tablet, Take 1 tablet by mouth 2 (two) times daily. (Patient not taking: Reported on 12/24/2022), Disp: 20 tablet, Rfl: 0   benzonatate (TESSALON) 100 MG capsule, Take 1 capsule (100 mg total) by mouth 3 (three) times daily as needed. (Patient not taking: Reported on 12/24/2022), Disp: 30 capsule, Rfl: 0   cetirizine (ZYRTEC) 10 MG tablet, Take 1 tablet (10 mg total) by mouth daily. (Patient not taking: Reported on 12/24/2022), Disp: 30 tablet, Rfl: 11   fluconazole (DIFLUCAN) 150 MG tablet, Take 1 tablet (150 mg total) by mouth every 3 (three) days as needed (for vaginal itching/yeast infection sx). (Patient not taking: Reported on 12/24/2022), Disp: 2 tablet, Rfl: 0   FLUoxetine (PROZAC) 40 MG capsule, Take 1 capsule (40 mg total) by mouth  daily., Disp: 90 capsule, Rfl: 3   fluticasone (FLONASE) 50 MCG/ACT nasal spray, Place 2 sprays into both nostrils daily. (Patient not taking: Reported on 12/24/2022), Disp: 16 g, Rfl: 0   ipratropium (ATROVENT) 0.03 % nasal spray, Place 2 sprays into both nostrils every 12 (twelve) hours. (Patient not taking: Reported on 12/24/2022), Disp: 30 mL, Rfl: 12   levonorgestrel (LILETTA) 19.5 MCG/DAY IUD IUD, 1 each by Intrauterine route once. , Disp: , Rfl:    LORazepam (ATIVAN) 0.5 MG tablet, Take 1 tablet (0.5 mg total) by mouth every 8 (eight) hours. (Patient not taking: Reported on 12/24/2022), Disp: 10 tablet, Rfl: 0  Allergies  Allergen Reactions   Mucinex [Guaifenesin Er] Hives    I personally  reviewed active problem list, medication list, allergies, family history, social history, health maintenance, notes from last encounter, lab results, imaging with the patient/caregiver today.   Review of Systems  Constitutional: Negative.   HENT: Negative.    Eyes: Negative.   Respiratory: Negative.    Cardiovascular: Negative.   Gastrointestinal: Negative.   Endocrine: Negative.   Genitourinary: Negative.   Musculoskeletal: Negative.   Skin: Negative.   Allergic/Immunologic: Negative.   Neurological: Negative.   Hematological: Negative.   Psychiatric/Behavioral: Negative.    All other systems reviewed and are negative.     Objective:   Virtual encounter, vitals limited, only able to obtain the following There were no vitals filed for this visit. There is no height or weight on file to calculate BMI. Nursing Note and Vital Signs reviewed.  Physical Exam Vitals and nursing note reviewed.  Constitutional:      General: She is not in acute distress.    Appearance: Normal appearance. She is not ill-appearing, toxic-appearing or diaphoretic.  Pulmonary:     Effort: No respiratory distress.  Neurological:     Mental Status: She is alert.  Psychiatric:        Mood and Affect: Mood normal.     PE limited by virtual encounter  No results found for this or any previous visit (from the past 72 hour(s)).  Assessment and Plan:     ICD-10-CM   1. Mixed hyperlipidemia  E78.2    see diet info below - LDL and total cholesteroll high, low risk ASCVD due to age    2. Class 1 obesity with body mass index (BMI) of 32.0 to 32.9 in adult, unspecified obesity type, unspecified whether serious comorbidity present  E66.9    Z68.32    encouraged to work on healthy diet emphasising fruit/veggies, whole foods, healthy fats, lean proteins and avoiding processed foods, recheck Lipids in 6 months    3. Elevated LFTs  R79.89     4. Fatigue, unspecified type  R53.83    we previously  discussed severe fatigue and doing labs wiht her upcoming cpe, we may want to do those sooner, r/o anemia, thyroid issues    5. Elevated serum GGT level  R74.8    cholestasis?  she reports IBS sx, not postprandial RUQ pain/N, will watch for sx and would recommend further eval if abnormal labs or sx    6. Abnormal laboratory test  R89.9    from screening life insurance blood work - reviewed results, concerns todya, she will send in more of the results for more complete eval       -Red flags and when to present for emergency care or RTC including fever >101.67F, chest pain, shortness of breath, new/worsening/un-resolving symptoms, reviewed with patient at  time of visit. Follow up and care instructions discussed and provided in AVS. - I discussed the assessment and treatment plan with the patient. The patient was provided an opportunity to ask questions and all were answered. The patient agreed with the plan and demonstrated an understanding of the instructions.  I provided 22+ minutes of non-face-to-face time during this encounter.  Delsa Grana, PA-C 01/01/23 10:31 PM

## 2023-01-02 ENCOUNTER — Encounter: Payer: Self-pay | Admitting: Family Medicine

## 2023-01-02 ENCOUNTER — Telehealth (INDEPENDENT_AMBULATORY_CARE_PROVIDER_SITE_OTHER): Payer: Medicaid Other | Admitting: Family Medicine

## 2023-01-02 DIAGNOSIS — R7989 Other specified abnormal findings of blood chemistry: Secondary | ICD-10-CM

## 2023-01-02 DIAGNOSIS — E782 Mixed hyperlipidemia: Secondary | ICD-10-CM | POA: Diagnosis not present

## 2023-01-02 DIAGNOSIS — R899 Unspecified abnormal finding in specimens from other organs, systems and tissues: Secondary | ICD-10-CM

## 2023-01-02 DIAGNOSIS — R5383 Other fatigue: Secondary | ICD-10-CM

## 2023-01-02 DIAGNOSIS — E669 Obesity, unspecified: Secondary | ICD-10-CM

## 2023-01-02 DIAGNOSIS — R748 Abnormal levels of other serum enzymes: Secondary | ICD-10-CM | POA: Diagnosis not present

## 2023-01-02 DIAGNOSIS — R945 Abnormal results of liver function studies: Secondary | ICD-10-CM | POA: Diagnosis not present

## 2023-01-02 DIAGNOSIS — Z6832 Body mass index (BMI) 32.0-32.9, adult: Secondary | ICD-10-CM | POA: Diagnosis not present

## 2023-01-02 NOTE — Patient Instructions (Addendum)
[email or web address]  Preventing High Cholesterol Cholesterol is a white, waxy substance similar to fat that the human body needs to help build cells. The liver makes all the cholesterol that a person's body needs. Having high cholesterol (hypercholesterolemia) increases your risk for heart disease and stroke. Extra or excess cholesterol comes from the food that you eat. High cholesterol can often be prevented with diet and lifestyle changes. If you already have high cholesterol, you can control it with diet, lifestyle changes, and medicines. How can high cholesterol affect me? If you have high cholesterol, fatty deposits (plaques) may build up on the walls of your blood vessels. The blood vessels that carry blood away from your heart are called arteries. Plaques make the arteries narrower and stiffer. This in turn can: Restrict or block blood flow and cause blood clots to form. Increase your risk for heart attack and stroke. What can increase my risk for high cholesterol? This condition is more likely to develop in people who: Eat foods that are high in saturated fat or cholesterol. Saturated fat is mostly found in foods that come from animal sources. Are overweight. Are not getting enough exercise. Use products that contain nicotine or tobacco, such as cigarettes, e-cigarettes, and chewing tobacco. Have a family history of high cholesterol (familial hypercholesterolemia). What actions can I take to prevent this? Nutrition  Eat less saturated fat. Avoid trans fats (partially hydrogenated oils). These are often found in margarine and in some baked goods, fried foods, and snacks bought in packages. Avoid precooked or cured meat, such as bacon, sausages, or meat loaves. Avoid foods and drinks that have added sugars. Eat more fruits, vegetables, and whole grains. Choose healthy sources of protein, such as fish, poultry, lean cuts of red meat, beans, peas, lentils,  and nuts. Choose healthy sources of fat, such as: Nuts. Vegetable oils, especially olive oil. Fish that have healthy fats, such as omega-3 fatty acids. These fish include mackerel or salmon. Lifestyle Lose weight if you are overweight. Maintaining a healthy body mass index (BMI) can help prevent or control high cholesterol. It can also lower your risk for diabetes and high blood pressure. Ask your health care provider to help you with a diet and exercise plan to lose weight safely. Do not use any products that contain nicotine or tobacco. These products include cigarettes, chewing tobacco, and vaping devices, such as e-cigarettes. If you need help quitting, ask your health care provider. Alcohol use Do not drink alcohol if: Your health care provider tells you not to drink. You are pregnant, may be pregnant, or are planning to become pregnant. If you drink alcohol: Limit how much you have to: 0-1 drink a day for women. 0-2 drinks a day for men. Know how much alcohol is in your drink. In the [place] one drink equals one 12 oz bottle of beer (355 mL), one 5 oz glass of wine (148 mL), or one 1 oz glass of hard liquor (44 mL). Activity  Get enough exercise. Do exercises as told by your health care provider. Each week, do at least 150 minutes of exercise that takes a medium level of effort (moderate-intensity exercise). This kind of exercise: Makes your heart beat faster while allowing you to still be able to talk. Can be done in short sessions several times a day or longer sessions a few times a week. For example, on 5 days each week, you could walk fast or ride your bike 3 times a day for 10 minutes each time.  Medicines Your health care provider may recommend medicines to help lower cholesterol. This may be a medicine to lower the amount of cholesterol that your liver makes. You may need medicine if: Diet and lifestyle changes have not lowered your cholesterol enough. You have high cholesterol  and other risk factors for heart disease or stroke. Take over-the-counter and prescription medicines only as told by your health care provider. General information Manage your risk factors for high cholesterol. Talk with your health care provider about all your risk factors and how to lower your risk. Manage other conditions that you have, such as diabetes or high blood pressure (hypertension). Have blood tests to check your cholesterol levels at regular points in time as told by your health care provider. Keep all follow-up visits. This is important. Where to find more information American Heart Association: www.heart.org National Heart, Lung, and Blood Institute: https://wilson-eaton.com/ Summary High cholesterol increases your risk for heart disease and stroke. By keeping your cholesterol level low, you can reduce your risk for these conditions. High cholesterol can often be prevented with diet and lifestyle changes. Work with your health care provider to manage your risk factors, and have your blood tested regularly. This information is not intended to replace advice given to you by your health care provider. Make sure you discuss any questions you have with your health care provider. Document Revised: 05/09/2022 Document Reviewed: 12/10/2020 Elsevier Patient Education  [employer].

## 2023-01-19 DIAGNOSIS — Z419 Encounter for procedure for purposes other than remedying health state, unspecified: Secondary | ICD-10-CM | POA: Diagnosis not present

## 2023-01-30 ENCOUNTER — Encounter: Payer: Self-pay | Admitting: Family Medicine

## 2023-01-30 ENCOUNTER — Ambulatory Visit (INDEPENDENT_AMBULATORY_CARE_PROVIDER_SITE_OTHER): Payer: Medicaid Other | Admitting: Family Medicine

## 2023-01-30 VITALS — BP 112/70 | HR 92 | Temp 97.8°F | Resp 16 | Ht 63.0 in | Wt 187.2 lb

## 2023-01-30 DIAGNOSIS — R7989 Other specified abnormal findings of blood chemistry: Secondary | ICD-10-CM

## 2023-01-30 DIAGNOSIS — Z6832 Body mass index (BMI) 32.0-32.9, adult: Secondary | ICD-10-CM

## 2023-01-30 DIAGNOSIS — F419 Anxiety disorder, unspecified: Secondary | ICD-10-CM

## 2023-01-30 DIAGNOSIS — F5105 Insomnia due to other mental disorder: Secondary | ICD-10-CM

## 2023-01-30 DIAGNOSIS — E669 Obesity, unspecified: Secondary | ICD-10-CM

## 2023-01-30 DIAGNOSIS — R5383 Other fatigue: Secondary | ICD-10-CM

## 2023-01-30 DIAGNOSIS — E782 Mixed hyperlipidemia: Secondary | ICD-10-CM

## 2023-01-30 DIAGNOSIS — F331 Major depressive disorder, recurrent, moderate: Secondary | ICD-10-CM

## 2023-01-30 MED ORDER — BUPROPION HCL ER (XL) 150 MG PO TB24: 150.0000 mg | ORAL_TABLET | Freq: Every day | ORAL | 0 refills | Status: DC

## 2023-01-30 NOTE — Patient Instructions (Addendum)
Return to do labs sometime next week between 8 and 9 am Do not eat prior to labs  Try wellbutrin and I really think therapy would be very helpful  Let check in again in about 6 weeks

## 2023-01-30 NOTE — Progress Notes (Unsigned)
Name: Susan Klein   MRN: [id number]    DOB: February 12, 1996   Date:01/30/2023       Progress Note  Chief Complaint  Patient presents with   Follow-up   Depression    Subjective:   Susan Klein is a 27 y.o. female, presents to clinic for f/up on mood/depression      01/30/2023   11:30 AM 01/02/2023    9:11 AM 11/24/2022    1:24 PM  Depression screen PHQ 2/9  Decreased Interest 1 1 0  Down, Depressed, Hopeless 2 1 0  PHQ - 2 Score 3 2 0  Altered sleeping 2 0 0  Tired, decreased energy 3 0 0  Change in appetite 1 0 0  Feeling bad or failure about yourself  0 0 0  Trouble concentrating 1 0 0  Moving slowly or fidgety/restless 1 0 0  Suicidal thoughts 0 0 0  PHQ-9 Score 11 2 0  Difficult doing work/chores Somewhat difficult Somewhat difficult Not difficult at all      07/01/2022    1:48 PM 06/03/2022    9:58 AM 04/29/2021    3:40 PM 09/18/2020   11:16 AM  GAD 7 : Generalized Anxiety Score  Nervous, Anxious, on Edge Control/stop worrying 1 1 0 0  Worry too much - different things 1 1 0 0  Trouble relaxing 1 1 0 0  Restless 1 1 0 0  Easily annoyed or irritable 0 Afraid - awful might happen 0 1 0 0  Total GAD 7 Score Anxiety Difficulty Somewhat difficult Somewhat difficult Not difficult at all Not difficult at all   Going through a lot at home depressive sx are worse esp her energy and fatigue Conflict in relationship School stressful Working on house      Current Outpatient Medications:    FLUoxetine (PROZAC) 40 MG capsule, Take 1 capsule (40 mg total) by mouth daily., Disp: 90 capsule, Rfl: 3   levonorgestrel (LILETTA) 19.5 MCG/DAY IUD IUD, 1 each by Intrauterine route once. , Disp: , Rfl:    LORazepam (ATIVAN) 0.5 MG tablet, Take 1 tablet (0.5 mg total) by mouth every 8 (eight) hours., Disp: 10 tablet, Rfl: 0   amoxicillin-clavulanate (AUGMENTIN) 875-125 MG tablet, Take 1 tablet by mouth 2 (two) times daily. (Patient not taking: Reported  on 12/24/2022), Disp: 20 tablet, Rfl: 0   benzonatate (TESSALON) 100 MG capsule, Take 1 capsule (100 mg total) by mouth 3 (three) times daily as needed. (Patient not taking: Reported on 12/24/2022), Disp: 30 capsule, Rfl: 0   cetirizine (ZYRTEC) 10 MG tablet, Take 1 tablet (10 mg total) by mouth daily. (Patient not taking: Reported on 12/24/2022), Disp: 30 tablet, Rfl: 11   fluconazole (DIFLUCAN) 150 MG tablet, Take 1 tablet (150 mg total) by mouth every 3 (three) days as needed (for vaginal itching/yeast infection sx). (Patient not taking: Reported on 12/24/2022), Disp: 2 tablet, Rfl: 0   fluticasone (FLONASE) 50 MCG/ACT nasal spray, Place 2 sprays into both nostrils daily. (Patient not taking: Reported on 12/24/2022), Disp: 16 g, Rfl: 0   ipratropium (ATROVENT) 0.03 % nasal spray, Place 2 sprays into both nostrils every 12 (twelve) hours. (Patient not taking: Reported on 12/24/2022), Disp: 30 mL, Rfl: 12  Patient Active Problem List   Diagnosis Date Noted   Class 1 obesity with body mass index (BMI) of 32.0 to 32.9 in adult 03/22/2020  Cold sensitivity 11/16/2019   Moderate episode of recurrent major depressive disorder 01/06/2019   Irritable bowel syndrome with constipation 01/06/2019   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    Past Surgical History:  Procedure Laterality Date   KNEE ARTHROSCOPY WITH ANTERIOR CRUCIATE LIGAMENT (ACL) REPAIR WITH HAMSTRING GRAFT Left 03/12/2021   Procedure: KNEE ARTHROSCOPY WITH ANTERIOR CRUCIATE LIGAMENT (ACL) REPAIR WITH HAMSTRING GRAFT;  Surgeon: Juanell Fairly, MD;  Location: [facility];  Service: Orthopedics;  Laterality: Left;   TONSILLECTOMY     TONSILLECTOMY AND ADENOIDECTOMY Bilateral 12/28/2018   Procedure: TONSILLECTOMY;  Surgeon: Geanie Logan, MD;  Location: [facility];  Service: ENT;  Laterality: Bilateral;  Faxed over consent, physicians orders, and H&P/klp    Family History  Problem Relation Age of Onset   Depression Mother    Heart attack  Mother    Hypertension Mother    Gestational diabetes Mother    Heart attack Father    Lung cancer Maternal Uncle    Alzheimer's disease Paternal Grandmother    Diabetes Maternal Grandmother     Social History   Tobacco Use   Smoking status: Former   Smokeless tobacco: Never  Building services engineer Use: Former   Substances: Nicotine, Flavoring   Devices: Hyde, has vaped for about 1 yr  Substance Use Topics   Alcohol use: No   Drug use: No     Allergies  Allergen Reactions   Mucinex [Guaifenesin Er] Hives    Health Maintenance  Topic Date Due   INFLUENZA VACCINE  05/21/2023   CHLAMYDIA SCREENING  12/24/2023   PAP-Cervical Cytology Screening  12/23/2025   PAP SMEAR-Modifier  12/23/2025   DTaP/Tdap/Td (8 - Td or Tdap) 10/25/2027   HPV VACCINES  Completed   Hepatitis C Screening  Completed   HIV Screening  Completed   COVID-19 Vaccine  Discontinued    Chart Review Today: I personally reviewed active problem list, medication list, allergies, family history, social history, health maintenance, notes from last encounter, lab results, imaging with the patient/caregiver today.   Review of Systems  Constitutional: Negative.   HENT: Negative.    Eyes: Negative.   Respiratory: Negative.    Cardiovascular: Negative.   Gastrointestinal: Negative.   Endocrine: Negative.   Genitourinary: Negative.   Musculoskeletal: Negative.   Skin: Negative.   Allergic/Immunologic: Negative.   Neurological: Negative.   Hematological: Negative.   Psychiatric/Behavioral: Negative.    All other systems reviewed and are negative.    Objective:   Vitals:   01/30/23 1135  BP: 112/70  Pulse: 92  Resp: 16  Temp: 97.8 F (36.6 C)  TempSrc: Oral  SpO2: 97%  Weight: 187 lb 3.2 oz (84.9 kg)  Height: 5\' 3"  (1.6 m)    Body mass index is 33.16 kg/m.  Physical Exam Vitals and nursing note reviewed.  Constitutional:      Appearance: She is well-developed.  HENT:     Head:  Normocephalic and atraumatic.     Nose: Nose normal.  Eyes:     General:        Right eye: No discharge.        Left eye: No discharge.     Conjunctiva/sclera: Conjunctivae normal.  Neck:     Trachea: No tracheal deviation.  Cardiovascular:     Rate and Rhythm: Normal rate and regular rhythm.  Pulmonary:     Effort: Pulmonary effort is normal. No respiratory distress.     Breath sounds: No stridor.  Musculoskeletal:  General: Normal range of motion.  Skin:    General: Skin is warm and dry.     Findings: No rash.  Neurological:     Mental Status: She is alert. Mental status is at baseline.     Motor: No abnormal muscle tone.     Coordination: Coordination normal.     Gait: Gait normal.  Psychiatric:        Attention and Perception: Attention and perception normal.        Mood and Affect: Mood and affect normal.        Speech: Speech normal.        Behavior: Behavior normal. Behavior is cooperative.        Thought Content: Thought content normal.         Assessment & Plan:   Problem List Items Addressed This Visit       Other   Insomnia secondary to anxiety   Relevant Medications   buPROPion (WELLBUTRIN XL) 150 MG 24 hr tablet   Moderate episode of recurrent major depressive disorder - Primary    Pt on prozac 40 mg daily    01/30/2023   11:30 AM 01/02/2023    9:11 AM 11/24/2022    1:24 PM  Depression screen PHQ 2/9  Decreased Interest 1 1 0  Down, Depressed, Hopeless 2 1 0  PHQ - 2 Score 3 2 0  Altered sleeping 2 0 0  Tired, decreased energy 3 0 0  Change in appetite 1 0 0  Feeling bad or failure about yourself  0 0 0  Trouble concentrating 1 0 0  Moving slowly or fidgety/restless 1 0 0  Suicidal thoughts 0 0 0  PHQ-9 Score 11 2 0  Difficult doing work/chores Somewhat difficult Somewhat difficult Not difficult at all  PHQ positive and increased    07/01/2022    1:48 PM 06/03/2022    9:58 AM 04/29/2021    3:40 PM 09/18/2020   11:16 AM  GAD 7 :  Generalized Anxiety Score  Nervous, Anxious, on Edge 1 1 1 1   Control/stop worrying 1 1 0 0  Worry too much - different things 1 1 0 0  Trouble relaxing 1 1 0 0  Restless 1 1 0 0  Easily annoyed or irritable 0 1 1 1   Afraid - awful might happen 0 1 0 0  Total GAD 7 Score 5 7 2 2   Anxiety Difficulty Somewhat difficult Somewhat difficult Not difficult at all Not difficult at all  GAD 7 mildly positive - slightly improved Multiple stressors Trial of adding wellbutrin Recommend est with psych or therapist        Relevant Medications   buPROPion (WELLBUTRIN XL) 150 MG 24 hr tablet   Other Relevant Orders   TSH   CBC with Differential/Platelet   T4, free   Insulin, Free (Bioactive)   Testosterone   Class 1 obesity with body mass index (BMI) of 32.0 to 32.9 in adult   Relevant Orders   COMPLETE METABOLIC PANEL WITH GFR   Other Visit Diagnoses     Mixed hyperlipidemia       Relevant Orders   COMPLETE METABOLIC PANEL WITH GFR   Fatigue, unspecified type       reports its been chronic, just slightly worse with so much stress and conflict, labs and trial of adding wellbutrin   Relevant Medications   buPROPion (WELLBUTRIN XL) 150 MG 24 hr tablet   Other Relevant Orders   TSH  CBC with Differential/Platelet   T4, free   Insulin, Free (Bioactive)   Testosterone   COMPLETE METABOLIC PANEL WITH GFR   Elevated LFTs       Relevant Orders   COMPLETE METABOLIC PANEL WITH GFR        Return for 6 weeks f/up mood/meds review labs.   Danelle Berry, PA-C 01/30/23 11:57 AM

## 2023-02-05 ENCOUNTER — Encounter: Payer: Self-pay | Admitting: Family Medicine

## 2023-02-05 NOTE — Assessment & Plan Note (Signed)
Pt on prozac 40 mg daily    01/30/2023   11:30 AM 01/02/2023    9:11 AM 11/24/2022    1:24 PM  Depression screen PHQ 2/9  Decreased Interest 1 1 0  Down, Depressed, Hopeless 2 1 0  PHQ - 2 Score 3 2 0  Altered sleeping 2 0 0  Tired, decreased energy 3 0 0  Change in appetite 1 0 0  Feeling bad or failure about yourself  0 0 0  Trouble concentrating 1 0 0  Moving slowly or fidgety/restless 1 0 0  Suicidal thoughts 0 0 0  PHQ-9 Score 11 2 0  Difficult doing work/chores Somewhat difficult Somewhat difficult Not difficult at all  PHQ positive and increased    07/01/2022    1:48 PM 06/03/2022    9:58 AM 04/29/2021    3:40 PM 09/18/2020   11:16 AM  GAD 7 : Generalized Anxiety Score  Nervous, Anxious, on Edge Control/stop worrying 1 1 0 0  Worry too much - different things 1 1 0 0  Trouble relaxing 1 1 0 0  Restless 1 1 0 0  Easily annoyed or irritable 0 Afraid - awful might happen 0 1 0 0  Total GAD 7 Score Anxiety Difficulty Somewhat difficult Somewhat difficult Not difficult at all Not difficult at all   GAD 7 mildly positive - slightly improved Multiple stressors Trial of adding wellbutrin Recommend est with psych or therapist

## 2023-02-07 ENCOUNTER — Ambulatory Visit
Admission: EM | Admit: 2023-02-07 | Discharge: 2023-02-07 | Disposition: A | Discharge status: Home / Self Care | Payer: Medicaid Other | Attending: Emergency Medicine | Admitting: Emergency Medicine

## 2023-02-07 DIAGNOSIS — H6691 Otitis media, unspecified, right ear: Secondary | ICD-10-CM | POA: Diagnosis not present

## 2023-02-07 DIAGNOSIS — H1031 Unspecified acute conjunctivitis, right eye: Secondary | ICD-10-CM | POA: Diagnosis not present

## 2023-02-07 DIAGNOSIS — J069 Acute upper respiratory infection, unspecified: Secondary | ICD-10-CM | POA: Diagnosis not present

## 2023-02-07 MED ORDER — CEFDINIR 300 MG PO CAPS: 300.0000 mg | ORAL_CAPSULE | Freq: Two times a day (BID) | ORAL | 0 refills | Status: AC

## 2023-02-07 NOTE — Discharge Instructions (Addendum)
Take the cefdinir as directed.  Follow up with your primary care provider if your symptoms are not improving.    
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
## 2023-02-07 NOTE — ED Triage Notes (Signed)
Patient to Urgent Care with complaints of productive cough/ right sided eye drainage and redness/ right sided eye pain.   Symptoms started several days ago. Ear/ eye pain started today. Congestion x1 week.   Possible fever on Monday.

## 2023-02-11 LAB — COMPLETE METABOLIC PANEL WITH GFR
AG Ratio: 2.1 (calc) (ref 1.0–2.5)
ALT: 40 U/L — ABNORMAL HIGH (ref 6–29)
AST: 22 U/L (ref 10–30)
Sodium: 139 mmol/L (ref 135–146)
eGFR: 126 mL/min/{1.73_m2} (ref >=60)

## 2023-02-13 LAB — TESTOSTERONE, TOTAL, LC/MS/MS: Testosterone, Total, LC-MS-MS: 21 ng/dL (ref 2–45)

## 2023-02-13 LAB — COMPLETE METABOLIC PANEL WITH GFR
Albumin: 4.7 g/dL (ref 3.6–5.1)
Alkaline phosphatase (APISO): 96 U/L (ref 31–125)
BUN: 12 mg/dL (ref 7–25)
Total Protein: 6.9 g/dL (ref 6.1–8.1)

## 2023-02-18 DIAGNOSIS — Z419 Encounter for procedure for purposes other than remedying health state, unspecified: Secondary | ICD-10-CM | POA: Diagnosis not present

## 2023-02-18 LAB — COMPLETE METABOLIC PANEL WITH GFR
CO2: 23 mmol/L (ref 20–32)
Calcium: 9.5 mg/dL (ref 8.6–10.2)
Chloride: 104 mmol/L (ref 98–110)
Creat: 0.64 mg/dL (ref 0.50–0.96)
Globulin: 2.2 g/dL (calc) (ref 1.9–3.7)
Glucose, Bld: 79 mg/dL (ref 65–99)
Potassium: 4.3 mmol/L (ref 3.5–5.3)
Total Bilirubin: 0.4 mg/dL (ref 0.2–1.2)

## 2023-02-18 LAB — CBC WITH DIFFERENTIAL/PLATELET
Absolute Monocytes: 611 cells/uL (ref 200–950)
Basophils Absolute: 60 cells/uL (ref 0–200)
Basophils Relative: 0.7 %
Eosinophils Absolute: 138 cells/uL (ref 15–500)
Eosinophils Relative: 1.6 %
HCT: 41.7 % (ref 35.0–45.0)
Hemoglobin: 13.9 g/dL (ref 11.7–15.5)
Lymphs Abs: 2442 cells/uL (ref 850–3900)
MCH: 30.2 pg (ref 27.0–33.0)
MCHC: 33.3 g/dL (ref 32.0–36.0)
MCV: 90.7 fL (ref 80.0–100.0)
MPV: 9.9 fL (ref 7.5–12.5)
Monocytes Relative: 7.1 %
Neutro Abs: 5349 cells/uL (ref 1500–7800)
Neutrophils Relative %: 62.2 %
Platelets: 269 10*3/uL (ref 140–400)
RBC: 4.6 10*6/uL (ref 3.80–5.10)
RDW: 11.9 % (ref 11.0–15.0)
Total Lymphocyte: 28.4 %
WBC: 8.6 10*3/uL (ref 3.8–10.8)

## 2023-02-18 LAB — TSH: TSH: 1.38 mIU/L

## 2023-02-18 LAB — INSULIN, FREE (BIOACTIVE): Insulin, Free: 20.2 u[IU]/mL — ABNORMAL HIGH (ref 1.5–14.9)

## 2023-02-18 LAB — T4, FREE: Free T4: 1.1 ng/dL (ref 0.8–1.8)

## 2023-03-13 ENCOUNTER — Ambulatory Visit: Payer: Medicaid Other | Admitting: Family Medicine

## 2023-03-21 DIAGNOSIS — Z419 Encounter for procedure for purposes other than remedying health state, unspecified: Secondary | ICD-10-CM | POA: Diagnosis not present

## 2023-04-20 DIAGNOSIS — Z419 Encounter for procedure for purposes other than remedying health state, unspecified: Secondary | ICD-10-CM | POA: Diagnosis not present

## 2023-04-22 ENCOUNTER — Ambulatory Visit
Admission: RE | Admit: 2023-04-22 | Discharge: 2023-04-22 | Disposition: A | Discharge status: Home / Self Care | Payer: Medicaid Other | Source: Ambulatory Visit | Attending: Emergency Medicine | Admitting: Emergency Medicine

## 2023-04-22 VITALS — BP 116/68 | HR 91 | Temp 98.8°F | Resp 18

## 2023-04-22 DIAGNOSIS — H1032 Unspecified acute conjunctivitis, left eye: Secondary | ICD-10-CM | POA: Diagnosis not present

## 2023-04-22 DIAGNOSIS — H5789 Other specified disorders of eye and adnexa: Secondary | ICD-10-CM

## 2023-04-22 DIAGNOSIS — H578A2 Foreign body sensation, left eye: Secondary | ICD-10-CM | POA: Diagnosis not present

## 2023-04-22 MED ORDER — POLYMYXIN B-TRIMETHOPRIM 10000-0.1 UNIT/ML-% OP SOLN: 1.0000 [drp] | Freq: Four times a day (QID) | OPHTHALMIC | 0 refills | Status: AC
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
## 2023-04-22 NOTE — ED Triage Notes (Signed)
Patient to Urgent Care with complaints of left sided eye itching. Reports eye is irritated and red. Has had some bilateral eye swelling.   Symptoms started on Saturday. Denies any foreign objects in her eyes but states she does feel as though she has something in her left eye.  Has been using eye drops.

## 2023-04-22 NOTE — Discharge Instructions (Addendum)
Use the antibiotic eyedrops as prescribed.  Follow-up with an eye care provider.

## 2023-04-24 ENCOUNTER — Other Ambulatory Visit: Payer: Self-pay | Admitting: Family Medicine

## 2023-04-24 DIAGNOSIS — F331 Major depressive disorder, recurrent, moderate: Secondary | ICD-10-CM

## 2023-04-24 DIAGNOSIS — R5383 Other fatigue: Secondary | ICD-10-CM

## 2023-04-24 NOTE — Telephone Encounter (Signed)
LVM to schedule a virtual appt today, 04/24/23 with Danelle Berry so that patient can get a refill on requested medication.

## 2023-04-24 NOTE — Telephone Encounter (Signed)
Can you see if she can schedule an appt for virtual today for leisa for depression refills

## 2023-04-24 NOTE — Telephone Encounter (Signed)
Please add her to leisa's schedule

## 2023-04-28 ENCOUNTER — Telehealth (INDEPENDENT_AMBULATORY_CARE_PROVIDER_SITE_OTHER): Payer: Medicaid Other | Admitting: Family Medicine

## 2023-04-28 DIAGNOSIS — F331 Major depressive disorder, recurrent, moderate: Secondary | ICD-10-CM

## 2023-04-28 DIAGNOSIS — R5383 Other fatigue: Secondary | ICD-10-CM

## 2023-04-28 MED ORDER — FLUOXETINE HCL 40 MG PO CAPS
40.0000 mg | ORAL_CAPSULE | Freq: Every day | ORAL | 1 refills | Status: DC
Start: 2023-04-28 — End: 2024-01-01

## 2023-04-28 MED ORDER — BUPROPION HCL ER (XL) 150 MG PO TB24
150.0000 mg | ORAL_TABLET | Freq: Every day | ORAL | 1 refills | Status: DC
Start: 2023-04-28 — End: 2024-01-01

## 2023-04-28 NOTE — Progress Notes (Signed)
Name: Susan Klein   MRN: [id number]    DOB: 1996/08/07   Date:04/28/2023       Progress Note  Subjective:    Chief Complaint  Chief Complaint  Patient presents with   Medication Refill    wellbutrin    I connected with  Jenel Lucks  on 04/28/23 at 11:20 AM EDT by a video enabled telemedicine application and verified that I am speaking with the correct person using two identifiers.  I discussed the limitations of evaluation and management by telemedicine and the availability of in person appointments. The patient expressed understanding and agreed to proceed. Staff also discussed with the patient that there may be a patient responsible charge related to this service. Patient Location: home Provider Location: [facility] Additional Individuals present: none  HPI  F/up on meds  PHQ-9 reviewed and negative    04/28/2023    9:44 AM 01/30/2023   11:30 AM 01/02/2023    9:11 AM  Depression screen PHQ 2/9  Decreased Interest 0 1 1  Down, Depressed, Hopeless 0 2 1  PHQ - 2 Score 0 3 2  Altered sleeping 0 2 0  Tired, decreased energy 0 3 0  Change in appetite 0 1 0  Feeling bad or failure about yourself  0 0 0  Trouble concentrating 0 1 0  Moving slowly or fidgety/restless 0 1 0  Suicidal thoughts 0 0 0  PHQ-9 Score 0 11 2  Difficult doing work/chores Not difficult at all Somewhat difficult Somewhat difficult      04/28/2023    9:45 AM 07/01/2022    1:48 PM 06/03/2022    9:58 AM 04/29/2021    3:40 PM  GAD 7 : Generalized Anxiety Score  Nervous, Anxious, on Edge 0 1 1 1   Control/stop worrying 0 1 1 0  Worry too much - different things 0 1 1 0  Trouble relaxing 0 1 1 0  Restless 0 1 1 0  Easily annoyed or irritable 0 0 1 1  Afraid - awful might happen 0 0 1 0  Total GAD 7 Score 0 5 7 2   Anxiety Difficulty Not difficult at all Somewhat difficult Somewhat difficult Not difficult at all   The 40 mg dose of Prozac is working for her She did feel that Wellbutrin in addition to  the Prozac is helping her some and she like to stay at the same dose for now   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 , Disp: , Rfl:    LORazepam (ATIVAN) 0.5 MG tablet, Take 1 tablet (0.5 mg total) by mouth every 8 (eight) hours., Disp:  10 tablet, Rfl: 0   trimethoprim-polymyxin b (POLYTRIM) ophthalmic solution, Place 1 drop into the left eye 4 (four) times daily for 7 days., Disp: 10 mL, Rfl: 0  Allergies  Allergen Reactions   Mucinex [Guaifenesin Er] Hives    I personally reviewed active problem list, medication list, allergies, family history, social history, health maintenance, notes from last encounter, lab results, imaging with the patient/caregiver today.   Review of Systems  Constitutional: Negative.   HENT: Negative.    Eyes: Negative.   Respiratory: Negative.    Cardiovascular: Negative.   Gastrointestinal: Negative.   Endocrine: Negative.   Genitourinary: Negative.   Musculoskeletal: Negative.   Skin: Negative.   Allergic/Immunologic: Negative.   Neurological: Negative.   Hematological: Negative.   Psychiatric/Behavioral: Negative.    All other systems reviewed and are negative.     Objective:    Virtual encounter, vitals limited, only able to obtain the following There were no vitals filed for this visit. There is no height or weight on file to calculate BMI. Nursing Note and Vital Signs reviewed.  Physical Exam Vitals and nursing note reviewed.  Constitutional:      Appearance: Normal appearance.  Neurological:     Mental Status: She is alert.  Psychiatric:        Mood and Affect: Mood normal.        Behavior: Behavior normal.     PE limited by virtual encounter  No results found for this or any previous visit (from the past 72 hour(s)).  Assessment and Plan:     ICD-10-CM   1. Moderate episode of recurrent major depressive disorder (HCC)  F33.1 FLUoxetine (PROZAC) 40 MG capsule    buPROPion (WELLBUTRIN XL) 150 MG 24 hr tablet   phq 9 much better, currently on prozac 40 and wellbutrin 150    2. Fatigue, unspecified type  R53.83 buPROPion (WELLBUTRIN XL) 150 MG 24 hr tablet   wellbutrin helped some with her fatigue, she will stay on the same dose for now      Follow up in office in 6 months - sooner if anything changes   - I discussed the assessment and treatment plan with the patient. The patient was provided an opportunity to ask questions and all were answered. The patient agreed with the plan and demonstrated an understanding of the instructions.  I provided 15 minutes of non-face-to-face time during this encounter.  Danelle Berry, PA-C 04/28/23 12:09 PM

## 2023-05-04 ENCOUNTER — Encounter: Payer: Self-pay | Admitting: Family Medicine

## 2023-05-21 DIAGNOSIS — Z419 Encounter for procedure for purposes other than remedying health state, unspecified: Secondary | ICD-10-CM | POA: Diagnosis not present

## 2023-05-26 ENCOUNTER — Ambulatory Visit (INDEPENDENT_AMBULATORY_CARE_PROVIDER_SITE_OTHER): Payer: Medicaid Other | Admitting: Family Medicine

## 2023-05-26 ENCOUNTER — Encounter: Payer: Self-pay | Admitting: Family Medicine

## 2023-05-26 VITALS — BP 126/74 | HR 93 | Temp 97.6°F | Resp 16 | Ht 63.0 in | Wt 174.5 lb

## 2023-05-26 DIAGNOSIS — J329 Chronic sinusitis, unspecified: Secondary | ICD-10-CM | POA: Diagnosis not present

## 2023-05-26 DIAGNOSIS — H1033 Unspecified acute conjunctivitis, bilateral: Secondary | ICD-10-CM

## 2023-05-26 MED ORDER — CROMOLYN SODIUM 4 % OP SOLN
1.0000 [drp] | Freq: Four times a day (QID) | OPHTHALMIC | 2 refills | Status: DC | PRN
Start: 2023-05-26 — End: 2023-06-30

## 2023-05-26 MED ORDER — LEVOCETIRIZINE DIHYDROCHLORIDE 5 MG PO TABS
5.0000 mg | ORAL_TABLET | Freq: Every evening | ORAL | 1 refills | Status: DC
Start: 2023-05-26 — End: 2024-01-01

## 2023-05-26 MED ORDER — FLUTICASONE PROPIONATE 50 MCG/ACT NA SUSP
2.0000 | Freq: Every day | NASAL | 6 refills | Status: DC
Start: 2023-05-26 — End: 2024-01-01

## 2023-05-26 NOTE — Progress Notes (Signed)
Patient ID: Susan Klein, female    DOB: 05-05-1996, 28 y.o.   MRN: [id number]  PCP: Danelle Berry, PA-C  Chief Complaint  Patient presents with   Sinusitis    Facial pressure, runny nose, slight cough onset for a week or over    Subjective:   Susan Klein is a 27 y.o. female, presents to clinic with CC of the following:  HPI  Nasal, eye, congestion ear sx itching congestion, she just got a new kitten, thinks she may be allergic, she has tried saline sinus rinses, zyrtec, claritin, benadryl and it hasn't helped her sx very much She has some intermittent ear pain, sinus pressure, no fever  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      Current Outpatient Medications:    buPROPion (WELLBUTRIN XL) 150 MG 24 hr tablet, Take 1 tablet (150 mg total) by mouth daily., Disp: 90 tablet, Rfl: 1   cromolyn (OPTICROM) 4 % ophthalmic solution, Place 1 drop into both eyes 4 (four) times daily as needed., Disp: 10 mL, Rfl: 2   FLUoxetine (PROZAC) 40 MG capsule, Take 1 capsule (40 mg total) by mouth daily., Disp: 90 capsule, Rfl: 1   fluticasone (FLONASE) 50 MCG/ACT nasal spray, Place 2 sprays into both nostrils daily., Disp: 16 g, Rfl: 6   levocetirizine (XYZAL) 5 MG tablet, Take 1 tablet (5 mg total) by mouth every evening., Disp: 90 tablet, Rfl: 1   levonorgestrel (LILETTA) 19.5 MCG/DAY IUD IUD, 1 each by Intrauterine route once. , Disp: , Rfl:    LORazepam (ATIVAN) 0.5 MG tablet, Take 1 tablet (0.5 mg total) by mouth every 8 (eight) hours., Disp: 10 tablet, Rfl: 0   Allergies  Allergen Reactions   Mucinex [Guaifenesin Er] Hives     Social History   Tobacco Use   Smoking status: Former    Smokeless tobacco: Never  Vaping Use   Vaping status: Former   Substances: Nicotine, Flavoring   Devices: Hyde, has vaped for about 1 yr  Substance Use Topics   Alcohol use: No   Drug use: No      Chart Review Today: I personally reviewed active problem list, medication list, allergies, family history, social history, health maintenance, notes from last encounter, lab results, imaging with the patient/caregiver today.   Review of Systems  Constitutional: Negative.   HENT: Negative.    Eyes: Negative.   Respiratory: Negative.    Cardiovascular: Negative.   Gastrointestinal: Negative.   Endocrine: Negative.   Genitourinary: Negative.   Musculoskeletal: Negative.   Skin: Negative.   Allergic/Immunologic: Negative.   Neurological: Negative.   Hematological: Negative.   Psychiatric/Behavioral: Negative.    All other systems reviewed and are negative.      Objective:   Vitals:   05/26/23 1307  BP: 126/74  Pulse: 93  Resp: 16  Temp: 97.6 F (36.4 C)  TempSrc: Oral  SpO2: 98%  Weight: 174 lb 8 oz (79.2 kg)  Height: 5\' 3"  (1.6 m)    Body mass index is 30.91 kg/m.  Physical Exam Vitals and nursing note reviewed.  Constitutional:      General: She is not in acute distress.    Appearance:  Normal appearance. She is well-developed. She is not ill-appearing, toxic-appearing or diaphoretic.  HENT:     Head: Normocephalic and atraumatic.     Right Ear: Tympanic membrane, ear canal and external ear normal. There is no impacted cerumen.     Left Ear: Tympanic membrane, ear canal and external ear normal. There is no impacted cerumen.     Nose: Mucosal edema, congestion and rhinorrhea present. Rhinorrhea is clear.     Right Turbinates: Enlarged.     Left Turbinates: Enlarged.     Comments: Erythematous nasal mucosa Very mild sinus ttp    Mouth/Throat:     Mouth: Mucous membranes are moist.     Pharynx: Oropharynx is clear. No oropharyngeal exudate or posterior  oropharyngeal erythema.  Eyes:     General: No scleral icterus.       Right eye: No discharge.        Left eye: No discharge.     Conjunctiva/sclera: Conjunctivae normal.  Neck:     Trachea: No tracheal deviation.  Cardiovascular:     Rate and Rhythm: Normal rate and regular rhythm.  Pulmonary:     Effort: Pulmonary effort is normal. No respiratory distress.     Breath sounds: No stridor.  Musculoskeletal:        General: Normal range of motion.  Skin:    General: Skin is warm and dry.     Findings: No rash.  Neurological:     Mental Status: She is alert.     Motor: No abnormal muscle tone.     Coordination: Coordination normal.  Psychiatric:        Behavior: Behavior normal.      Results for orders placed or performed in visit on 01/30/23  TSH  Result Value Ref Range   TSH 1.38 mIU/L  CBC with Differential/Platelet  Result Value Ref Range   WBC 8.6 3.8 - 10.8 Thousand/uL   RBC 4.60 3.80 - 5.10 Million/uL   Hemoglobin 13.9 11.7 - 15.5 g/dL   HCT 46.9 62.9 - 52.8 %   MCV 90.7 80.0 - 100.0 fL   MCH 30.2 27.0 - 33.0 pg   MCHC 33.3 32.0 - 36.0 g/dL   RDW 41.3 24.4 - 01.0 %   Platelets 269 140 - 400 Thousand/uL   MPV 9.9 7.5 - 12.5 fL   Neutro Abs 5,349 1,500 - 7,800 cells/uL   Lymphs Abs 2,442 850 - 3,900 cells/uL   Absolute Monocytes 611 200 - 950 cells/uL   Eosinophils Absolute 138 15 - 500 cells/uL   Basophils Absolute 60 0 - 200 cells/uL   Neutrophils Relative % 62.2 %   Total Lymphocyte 28.4 %   Monocytes Relative 7.1 %   Eosinophils Relative 1.6 %   Basophils Relative 0.7 %  T4, free  Result Value Ref Range   Free T4 1.1 0.8 - 1.8 ng/dL  Insulin, Free (Bioactive)  Result Value Ref Range   Insulin, Free 20.2 (H) 1.5 - 14.9 uIU/mL  COMPLETE METABOLIC PANEL WITH GFR  Result Value Ref Range   Glucose, Bld 79 65 - 99 mg/dL   BUN 12 7 - 25 mg/dL   Creat 2.72 5.36 - 6.44 mg/dL   eGFR 034 > OR = 60 VQ/QVZ/5.63O7   BUN/Creatinine Ratio SEE NOTE: 6 - 22 (calc)    Sodium 139 135 - 146 mmol/L   Potassium 4.3 3.5 - 5.3 mmol/L   Chloride 104 98 - 110 mmol/L   CO2 23 20 - 32 mmol/L  Calcium 9.5 8.6 - 10.2 mg/dL   Total Protein 6.9 6.1 - 8.1 g/dL   Albumin 4.7 3.6 - 5.1 g/dL   Globulin 2.2 1.9 - 3.7 g/dL (calc)   AG Ratio 2.1 1.0 - 2.5 (calc)   Total Bilirubin 0.4 0.2 - 1.2 mg/dL   Alkaline phosphatase (APISO) 96 31 - 125 U/L   AST 22 10 - 30 U/L   ALT 40 (H) 6 - 29 U/L  Testosterone, Total, LC/MS/MS  Result Value Ref Range   Testosterone, Total, LC-MS-MS 21 2 - 45 ng/dL       Assessment & Plan:   1 week URI/allergy sx, no fever, concerned she may be allergic to new cat/kitten No concerning sinus ttp on exam, no indication for abx today Try xyzal at bedtime, can do zyrtec or claritin in the am, start flonase, sudafed prn, eye drops for itchy eyes She is following up with an allergy specialists - they can guide care after she sees them    ICD-10-CM   1. Rhinosinusitis  J32.9 fluticasone (FLONASE) 50 MCG/ACT nasal spray    levocetirizine (XYZAL) 5 MG tablet    2. Acute conjunctivitis of both eyes, unspecified acute conjunctivitis type  H10.33 cromolyn (OPTICROM) 4 % ophthalmic solution     F/up as needed     Danelle Berry, PA-C 05/26/23 1:38 PM

## 2023-05-28 DIAGNOSIS — J301 Allergic rhinitis due to pollen: Secondary | ICD-10-CM | POA: Diagnosis not present

## 2023-06-02 DIAGNOSIS — J309 Allergic rhinitis, unspecified: Secondary | ICD-10-CM | POA: Diagnosis not present

## 2023-06-21 DIAGNOSIS — Z419 Encounter for procedure for purposes other than remedying health state, unspecified: Secondary | ICD-10-CM | POA: Diagnosis not present

## 2023-06-30 ENCOUNTER — Ambulatory Visit (INDEPENDENT_AMBULATORY_CARE_PROVIDER_SITE_OTHER): Payer: Medicaid Other | Admitting: Dermatology

## 2023-06-30 VITALS — BP 136/87 | HR 77

## 2023-06-30 DIAGNOSIS — L689 Hypertrichosis, unspecified: Secondary | ICD-10-CM

## 2023-06-30 DIAGNOSIS — L988 Other specified disorders of the skin and subcutaneous tissue: Secondary | ICD-10-CM

## 2023-06-30 DIAGNOSIS — L700 Acne vulgaris: Secondary | ICD-10-CM | POA: Diagnosis not present

## 2023-06-30 MED ORDER — DAPSONE 7.5 % EX GEL: CUTANEOUS | 3 refills | Status: DC

## 2023-06-30 MED ORDER — ADAPALENE 0.3 % EX GEL: CUTANEOUS | 3 refills | Status: DC

## 2023-06-30 MED ORDER — SPIRONOLACTONE 100 MG PO TABS: 100.0000 mg | ORAL_TABLET | Freq: Every day | ORAL | 3 refills | Status: DC

## 2023-06-30 NOTE — Progress Notes (Signed)
New Patient Visit   Subjective  Susan Klein is a 27 y.o. female who presents for the following: Acne Vulgaris on the face x 1 year. She uses Cetaphil face wash to cleanse. No prescription treatment in the past. Occasionally she will have flares on the chest and back. She has dark hairs on the chin.  She also has wrinkles on the face and would like to discuss treatment.    The following portions of the chart were reviewed this encounter and updated as appropriate: medications, allergies, medical history  Review of Systems:  No other skin or systemic complaints except as noted in HPI or Assessment and Plan.  Objective  Well appearing patient in no apparent distress; mood and affect are within normal limits.  Areas Examined: Face, chest and back  Relevant exam findings are noted in the Assessment and Plan. Chin, neck Increased hair growth.     Assessment & Plan   Hypertrichosis Chin, neck  Discussed LHR, shaving but avoid close shave. Avoid plucking hairs.   ACNE VULGARIS Exam: Scattered inflammatory papules on the chin, upper neck, lower cheeks, right temple; closed comedones  Chronic and persistent condition with duration or expected duration over one year. Condition is bothersome/symptomatic for patient. Currently flared.   Treatment Plan: Start Spironolactone 100 MG take 1 po every day dsp #30. Cut pill in half the first week. If no side effects, may increase to full pill. Spironolactone can cause increased urination and cause blood pressure to decrease. Please watch for signs of lightheadedness and be cautious when changing position. It can sometimes cause breast tenderness or an irregular period in premenopausal women. It can also increase potassium. The increase in potassium usually is not a concern unless you are taking other medicines that also increase potassium, so please be sure your doctor knows all of the other medications you are taking. This medication  should not be taken by pregnant women.  This medicine should also not be taken together with sulfa drugs like Bactrim (trimethoprim/sulfamethexazole).   Start Differin 0.3% Gel Apply to face at bedtime as tolerated dsp 45g 3Rf. Topical retinoid medications like tretinoin/Retin-A, adapalene/Differin, tazarotene/Fabior, and Epiduo/Epiduo Forte can cause dryness and irritation when first started. Only apply a pea-sized amount to the entire affected area. Avoid applying it around the eyes, edges of mouth and creases at the nose. If you experience irritation, use a good moisturizer first and/or apply the medicine less often. If you are doing well with the medicine, you can increase how often you use it until you are applying every night. Be careful with sun protection while using this medication as it can make you sensitive to the sun. This medicine should not be used by pregnant women.   Start Aczone 7.5% Gel Apply to face every morning dsp 90g 3Rf.  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 Call for new or changing lesions.  Staying in the shade or wearing long sleeves, sun glasses (UVA+UVB protection) and wide brim hats (4-inch brim around the entire circumference of the hat) are also recommended for sun protection.    Return in about 3 months (around 09/29/2023) for Acne.  Wendee Beavers, CMA, am acting as scribe  for Willeen Niece, MD .   Documentation: I have reviewed the above documentation for accuracy and completeness, and I agree with the above.  Willeen Niece, MD

## 2023-06-30 NOTE — Patient Instructions (Addendum)
Start Spironolactone 100 MG - take 1/2 tablet by mouth every day x 1 week. If tolerating ok, increase to full tablet daily.  Spironolactone can cause increased urination and cause blood pressure to decrease. Please watch for signs of lightheadedness and be cautious when changing position. It can sometimes cause breast tenderness or an irregular period in premenopausal women. It can also increase potassium. The increase in potassium usually is not a concern unless you are taking other medicines that also increase potassium, so please be sure your doctor knows all of the other medications you are taking. This medication should not be taken by pregnant women.  This medicine should also not be taken together with sulfa drugs like Bactrim (trimethoprim/sulfamethexazole).   Start Differin 0.3% Gel Apply a pea-sized amount to face every night as tolerated. Topical retinoid medications like tretinoin/Retin-A, adapalene/Differin, tazarotene/Fabior, and Epiduo/Epiduo Forte can cause dryness and irritation when first started. Only apply a pea-sized amount to the entire affected area. Avoid applying it around the eyes, edges of mouth and creases at the nose. If you experience irritation, use a good moisturizer first and/or apply the medicine less often. If you are doing well with the medicine, you can increase how often you use it until you are applying every night. Be careful with sun protection while using this medication as it can make you sensitive to the sun. This medicine should not be used by pregnant women.   Start Aczone 7.5% Gel Apply to face every morning.  Basic OTC daily skin care regimen to prevent photoaging:   Recommend facial moisturizer with sunscreen SPF 30 every morning (OTC brands include CeraVe AM, Neutrogena, Eucerin, Cetaphil, Aveeno, La Roche Posay).  Can also apply a topical Vit C serum which is an antioxidant (OTC brands include CeraVe, La Roche Posay, and The Ordinary) underneath sunscreen  in morning. If you are outside during the day in the summer for extended periods, especially swimming and/or sweating, make sure you apply a water resistant facial sunscreen lotion spf 30 or higher.   At night recommend a cream with retinol (a vitamin A derivative which stimulates collagen production) like CeraVe skin renewing retinol serum or ROC retinol correxion cream or Neutrogena rapid wrinkle repair cream. Retinol may cause skin irritation in people with sensitive skin.  Can use it every other day and/or apply on top of a hyaluronic acid (HA) moisturizer/serum (Neutrogena Hydroboost water cream) if better tolerated that way.  Retinol may also help with lightening brown spots.   Our office sells high quality, medically tested skin care lines such as Elta MD sunscreens (with Zinc), and Alastin skin care products, which are very effective in treating photoaging. The Alastin line includes cosmeceutical grade Vit.C serum, HA serum, Elastin stimulating moisturizers/serums, lightening serum, and sunscreens.  If you want prescription treatment, then you would need an appointment (Rx tretinoin and fade creams, Botox, filler injections, laser treatments, etc.) These prescriptions and procedures are not covered by insurance but work very well.  Recommend daily broad spectrum sunscreen SPF 30+ to sun-exposed areas, reapply every 2 hours as needed. Call for new or changing lesions.  Staying in the shade or wearing long sleeves, sun glasses (UVA+UVB protection) and wide brim hats (4-inch brim around the entire circumference of the hat) are also recommended for sun protection.   Due to recent changes in healthcare laws, you may see results of your pathology and/or laboratory studies on MyChart before the doctors have had a chance to review them. We understand that  in some cases there may be results that are confusing or concerning to you. Please understand that not all results are received at the same time and  often the doctors may need to interpret multiple results in order to provide you with the best plan of care or course of treatment. Therefore, we ask that you please give [place] 2 business days to thoroughly review all your results before contacting the office for clarification. Should we see a critical lab result, you will be contacted sooner.   If You Need Anything After Your Visit  If you have any questions or concerns for your doctor, please call our main line at [phone] and press option 4 to reach your doctor's medical assistant. If no one answers, please leave a voicemail as directed and we will return your call as soon as possible. Messages left after 4 pm will be answered the following business day.   You may also send [place] a message via MyChart. We typically respond to MyChart messages within 1-2 business days.  For prescription refills, please ask your pharmacy to contact our office. Our fax number is [phone].  If you have an urgent issue when the clinic is closed that cannot wait until the next business day, you can page your doctor at the number below.    Please note that while we do our best to be available for urgent issues outside of office hours, we are not available 24/7.   If you have an urgent issue and are unable to reach [place], you may choose to seek medical care at your doctor's office, retail clinic, urgent care center, or emergency room.  If you have a medical emergency, please immediately call 911 or go to the emergency department.  Pager Numbers  - Dr. Gwen Pounds: [phone]  - Dr. Roseanne Reno: [phone]  - Dr. Katrinka Blazing: [phone]   In the event of inclement weather, please call our main line at [phone] for an update on the status of any delays or closures.  Dermatology Medication Tips: Please keep the boxes that topical medications come in in order to help keep track of the instructions about where and how to use these. Pharmacies typically print the  medication instructions only on the boxes and not directly on the medication tubes.   If your medication is too expensive, please contact our office at [phone] option 4 or send [place] a message through MyChart.   We are unable to tell what your co-pay for medications will be in advance as this is different depending on your insurance coverage. However, we may be able to find a substitute medication at lower cost or fill out paperwork to get insurance to cover a needed medication.   If a prior authorization is required to get your medication covered by your insurance company, please allow [place] 1-2 business days to complete this process.  Drug prices often vary depending on where the prescription is filled and some pharmacies may offer cheaper prices.  The website www.goodrx.com contains coupons for medications through different pharmacies. The prices here do not account for what the cost may be with help from insurance (it may be cheaper with your insurance), but the website can give you the price if you did not use any insurance.  - You can print the associated coupon and take it with your prescription to the pharmacy.  - You may also stop by our office during regular business hours and pick up a GoodRx coupon card.  - If you need  your prescription sent electronically to a different pharmacy, notify our office through [facility] or by phone at [phone] option 4.     Si Usted Necesita Algo Despus de Su Visita  Tambin puede enviarnos un mensaje a travs de Clinical cytogeneticist. Por lo general respondemos a los mensajes de MyChart en el transcurso de 1 a 2 das hbiles.  Para renovar recetas, por favor pida a su farmacia que se ponga en contacto con nuestra oficina. Annie Sable de fax es [place] [phone].  Si tiene un asunto urgente cuando la clnica est cerrada y que no puede esperar hasta el siguiente da hbil, puede llamar/localizar a su doctor(a) al nmero que aparece a continuacin.    Por favor, tenga en cuenta que aunque hacemos todo lo posible para estar disponibles para asuntos urgentes fuera del horario de [place], no estamos disponibles las 24 horas del da, los 7 [address]  [address] de la [place].   Si tiene un problema urgente y no puede comunicarse con nosotros, puede optar por buscar atencin mdica  en el consultorio de su doctor(a), en una clnica privada, en un centro de atencin urgente o en una sala de emergencias.  Si tiene Engineer, drilling, por favor llame inmediatamente al 911 o vaya a la sala de emergencias.  Nmeros de bper  - Dr. Gwen Pounds: [phone]  - Dra. Roseanne Reno: [phone]  - Dr. Katrinka Blazing: [phone]   En caso de inclemencias del tiempo, por favor llame a Lacy Duverney principal al [phone] para una actualizacin sobre el [place] de cualquier retraso o cierre.  Consejos para la medicacin en dermatologa: Por favor, guarde las cajas en las que vienen los medicamentos de uso tpico para ayudarle a seguir las instrucciones sobre dnde y cmo usarlos. Las farmacias generalmente imprimen las instrucciones del medicamento slo en las cajas y no directamente en los tubos del [place].   Si su medicamento es muy caro, por favor, pngase en contacto con Rolm Gala llamando al [phone] y presione la opcin 4 o envenos un mensaje a travs de Clinical cytogeneticist.   No podemos decirle cul ser su copago por los medicamentos por adelantado ya que esto es diferente dependiendo de la cobertura de su seguro. Sin embargo, es posible que podamos encontrar un medicamento sustituto a Audiological scientist un formulario para que el seguro cubra el medicamento que se considera necesario.   Si se requiere una autorizacin previa para que su compaa de seguros [place] su medicamento, por favor permtanos de 1 a 2 das hbiles para completar [address]  Los precios de los medicamentos varan con frecuencia dependiendo del Environmental consultant de dnde se surte la receta y alguna  farmacias pueden ofrecer precios ms baratos.  El sitio web www.goodrx.com tiene cupones para medicamentos de Health and safety inspector. Los precios aqu no tienen en cuenta lo que podra costar con la ayuda del seguro (puede ser ms barato con su seguro), pero el sitio web puede darle el precio si no utiliz Tourist information centre manager.  - Puede imprimir el cupn correspondiente y llevarlo con su receta a la farmacia.  - Tambin puede pasar por nuestra oficina durante el horario de atencin regular y Education officer, museum una tarjeta de cupones de GoodRx.  - Si necesita que su receta se enve electrnicamente a una farmacia diferente, informe a nuestra oficina a travs de MyChart de [place] o por telfono llamando al [phone] y presione la opcin 4.

## 2023-07-20 DIAGNOSIS — M654 Radial styloid tenosynovitis [de Quervain]: Secondary | ICD-10-CM | POA: Diagnosis not present

## 2023-07-21 DIAGNOSIS — Z419 Encounter for procedure for purposes other than remedying health state, unspecified: Secondary | ICD-10-CM | POA: Diagnosis not present

## 2023-08-21 DIAGNOSIS — Z419 Encounter for procedure for purposes other than remedying health state, unspecified: Secondary | ICD-10-CM | POA: Diagnosis not present

## 2023-09-20 DIAGNOSIS — Z419 Encounter for procedure for purposes other than remedying health state, unspecified: Secondary | ICD-10-CM | POA: Diagnosis not present

## 2023-10-01 ENCOUNTER — Ambulatory Visit: Payer: Medicaid Other

## 2023-10-01 VITALS — BP 131/82 | HR 80 | Ht 63.0 in | Wt 172.9 lb

## 2023-10-01 DIAGNOSIS — Z3009 Encounter for other general counseling and advice on contraception: Secondary | ICD-10-CM

## 2023-10-01 DIAGNOSIS — Z3202 Encounter for pregnancy test, result negative: Secondary | ICD-10-CM | POA: Diagnosis not present

## 2023-10-01 DIAGNOSIS — Z30432 Encounter for removal of intrauterine contraceptive device: Secondary | ICD-10-CM

## 2023-10-01 LAB — POCT URINE PREGNANCY: Preg Test, Ur: NEGATIVE

## 2023-10-01 MED ORDER — NORETHINDRONE 0.35 MG PO TABS: 1.0000 | ORAL_TABLET | Freq: Every day | ORAL | 11 refills | Status: DC
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
## 2023-10-05 ENCOUNTER — Ambulatory Visit: Payer: Medicaid Other | Admitting: Dermatology

## 2023-10-21 DIAGNOSIS — Z419 Encounter for procedure for purposes other than remedying health state, unspecified: Secondary | ICD-10-CM | POA: Diagnosis not present

## 2023-10-21 NOTE — L&D Delivery Note (Signed)
 Delivery Note   KAYIA BILLINGER is a 28 y.o. G2P1001 at [redacted]w[redacted]d Estimated Date of Delivery: 08/13/24  PRE-OPERATIVE DIAGNOSIS:  1) [redacted]w[redacted]d pregnancy.  2) GHTN  POST-OPERATIVE DIAGNOSIS:  1) [redacted]w[redacted]d pregnancy s/p NSVD 2) GHTN  Delivery Type: NSVD  Delivery Anesthesia: epidural  Labor Complications: none    ESTIMATED BLOOD LOSS: 350 ml    FINDINGS:   1) female infant, Walker, Apgar scores of 9   at 1 minute and 9   at 5 minutes; birthweight pending   SPECIMENS:   PLACENTA:   Appearance: intact   Removal: spontaneous   Disposition: per protocol  CORD BLOOD: Collected DISPOSITION:  Infant left in stable condition in the delivery room, with L&D personnel and mother.  NARRATIVE SUMMARY: Labor course:  GEORGEAN SPAINHOWER is a G2P1001 at [redacted]w[redacted]d who presented to Labor & Delivery for labor management and GHTN. Her initial cervical exam was 5/80/-2. Labor proceeded spontaneously, and she was found to be completely dilated at 1513. With excellent maternal pushing effort, she birthed a viable female infant at 90. There was not a nuchal cord. The head was born ROA with restitution to ROT. The shoulders were birthed without difficulty. The infant was placed skin-to-skin with Jarah. The cord was doubly clamped and cut by the father when pulsations ceased. The placenta delivered spontaneously and was noted to be intact with a 3VC. A perineal and vaginal examination was performed. Episiotomy/Lacerations: labial laceration, not repaired  Regina tolerated this well. Mother and baby were left in stable condition.   [name] Canny, CNM 07/31/2024 5:08 PM

## 2023-10-27 ENCOUNTER — Other Ambulatory Visit (HOSPITAL_COMMUNITY)
Admission: RE | Admit: 2023-10-27 | Discharge: 2023-10-27 | Disposition: A | Discharge status: Home / Self Care | Payer: Medicaid Other | Source: Ambulatory Visit | Attending: Certified Nurse Midwife | Admitting: Certified Nurse Midwife

## 2023-10-27 ENCOUNTER — Ambulatory Visit (INDEPENDENT_AMBULATORY_CARE_PROVIDER_SITE_OTHER): Payer: Medicaid Other | Admitting: Certified Nurse Midwife

## 2023-10-27 VITALS — BP 117/74 | HR 108 | Wt 180.3 lb

## 2023-10-27 DIAGNOSIS — Z7251 High risk heterosexual behavior: Secondary | ICD-10-CM | POA: Diagnosis not present

## 2023-10-27 DIAGNOSIS — Z3202 Encounter for pregnancy test, result negative: Secondary | ICD-10-CM | POA: Diagnosis not present

## 2023-10-27 DIAGNOSIS — R350 Frequency of micturition: Secondary | ICD-10-CM | POA: Insufficient documentation

## 2023-10-27 DIAGNOSIS — N898 Other specified noninflammatory disorders of vagina: Secondary | ICD-10-CM

## 2023-10-27 LAB — POCT URINALYSIS DIPSTICK
Bilirubin, UA: NEGATIVE
Blood, UA: NEGATIVE
Glucose, UA: NEGATIVE
Ketones, UA: NEGATIVE
Leukocytes, UA: NEGATIVE
Nitrite, UA: NEGATIVE
Protein, UA: NEGATIVE
Spec Grav, UA: 1.02 (ref 1.010–1.025)
Urobilinogen, UA: 0.2 U/dL
pH, UA: 5 (ref 5.0–8.0)

## 2023-10-27 LAB — POCT URINE PREGNANCY: Preg Test, Ur: NEGATIVE

## 2023-10-27 NOTE — Progress Notes (Signed)
 Leavy Mole, PA-C   Chief Complaint  Patient presents with   Urinary Tract Infection    HPI:      Susan Klein is a 28 y.o. G1P1001 whose LMP was Patient's last menstrual period was 10/04/2023 (exact date)., presents today for urinary frequency, urgency, cloudy urine for last two days. She may be pregnant, has not taken UPT at home, cycle expected this Thursday. Open to conception.    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    Past Surgical History:  Procedure Laterality Date   KNEE ARTHROSCOPY WITH ANTERIOR CRUCIATE LIGAMENT (ACL) REPAIR WITH HAMSTRING GRAFT Left 03/12/2021   Procedure: KNEE ARTHROSCOPY WITH ANTERIOR CRUCIATE LIGAMENT (ACL) REPAIR WITH HAMSTRING GRAFT;  Surgeon: Marchia Drivers, MD;  Location: [facility];  Service: Orthopedics;  Laterality: Left;   TONSILLECTOMY     TONSILLECTOMY AND ADENOIDECTOMY Bilateral 12/28/2018   Procedure: TONSILLECTOMY;  Surgeon: Blair Mt, MD;  Location: [facility];  Service: ENT;  Laterality: Bilateral;  Faxed over consent, physicians orders, and H&P/klp    Family History  Problem Relation Age of Onset   Depression Mother    Heart attack Mother    Hypertension Mother    Gestational diabetes Mother    Heart attack Father    Lung cancer Maternal Uncle    Alzheimer's disease Paternal Grandmother    Diabetes Maternal Grandmother     Social History   Socioeconomic History   Marital status: Significant Other    Spouse name: Not on file   Number of children: 1   Years of education: Not on file   Highest education level: Some college, no degree  Occupational History   Occupation: stay at home mom  Tobacco Use    Smoking status: Former   Smokeless tobacco: Never  Advertising Account Planner   Vaping status: Former   Substances: Nicotine , Flavoring   Devices: [place], has vaped for about 1 yr  Substance and Sexual Activity   Alcohol use: No   Drug use: No   Sexual activity: Yes    Partners: Male    Birth control/protection: I.U.D.  Other Topics Concern   Not on file  Social History Narrative   Not on file   Social Drivers of Health   Financial Resource Strain: Medium Risk (09/30/2022)   Overall Financial Resource Strain (CARDIA)    Difficulty of Paying Living Expenses: Somewhat hard  Food Insecurity: No Food Insecurity (09/30/2022)   Hunger Vital Sign    Worried About Running Out of Food in the Last Year: Never true    Ran Out of Food in the Last Year: Never true  Transportation Needs: No Transportation Needs (09/30/2022)   PRAPARE - Administrator, Civil Service (Medical): No    Lack of Transportation (Non-Medical): No  Physical Activity: Insufficiently Active (09/30/2022)   Exercise Vital Sign    Days of Exercise per Week: 3 days    Minutes of Exercise per Session: 20 min  Stress: Stress Concern Present (09/30/2022)   [employer] of Occupational Health - Occupational Stress Questionnaire    Feeling of Stress : Very much  Social Connections: Moderately Isolated (09/30/2022)   Social Connection and Isolation Panel [NHANES]  Frequency of Communication with Friends and Family: Three times a week    Frequency of Social Gatherings with Friends and Family: Once a week    Attends Religious Services: Never    Database Administrator or Organizations: No    Attends Banker Meetings: Never    Marital Status: Living with partner  Intimate Partner Violence: Unknown (09/30/2022)   Humiliation, Afraid, Rape, and Kick questionnaire    Fear of Current or Ex-Partner: No    Emotionally Abused: Patient declined    Physically Abused: No    Sexually Abused: No    Outpatient  Medications Prior to Visit  Medication Sig Dispense Refill   Adapalene  (DIFFERIN ) 0.3 % gel Apply a pea-sized amount to face every night as tolerated. 45 g 3   amoxicillin  (AMOXIL ) 875 MG tablet Take 875 mg by mouth 2 (two) times daily.     buPROPion  (WELLBUTRIN  XL) 150 MG 24 hr tablet Take 1 tablet (150 mg total) by mouth daily. (Patient not taking: Reported on 10/27/2023) 90 tablet 1   Dapsone  (ACZONE ) 7.5 % GEL Apply to face once daily for acne. 90 g 3   FLUoxetine  (PROZAC ) 40 MG capsule Take 1 capsule (40 mg total) by mouth daily. (Patient not taking: Reported on 10/27/2023) 90 capsule 1   fluticasone  (FLONASE ) 50 MCG/ACT nasal spray Place 2 sprays into both nostrils daily. 16 g 6   levocetirizine (XYZAL ) 5 MG tablet Take 1 tablet (5 mg total) by mouth every evening. 90 tablet 1   LORazepam  (ATIVAN ) 0.5 MG tablet Take 1 tablet (0.5 mg total) by mouth every 8 (eight) hours. 10 tablet 0   norethindrone  (ORTHO MICRONOR ) 0.35 MG tablet Take 1 tablet (0.35 mg total) by mouth daily. 28 tablet 11   predniSONE (STERAPRED UNI-PAK 21 TAB) 10 MG (21) TBPK tablet Take by mouth.     spironolactone  (ALDACTONE ) 100 MG tablet Take 1 tablet (100 mg total) by mouth daily. 30 tablet 3   No facility-administered medications prior to visit.      ROS:  Review of Systems  Constitutional: Negative.   Respiratory: Negative.    Cardiovascular: Negative.   Genitourinary:  Positive for frequency, urgency and vaginal discharge.     OBJECTIVE:   Vitals:  BP 117/74   Pulse (!) 108   Wt 180 lb 4.8 oz (81.8 kg)   LMP 10/04/2023 (Exact Date)   BMI 31.94 kg/m   Physical Exam Constitutional:      Appearance: Normal appearance.  Cardiovascular:     Rate and Rhythm: Normal rate.  Pulmonary:     Effort: Pulmonary effort is normal.  Abdominal:     Tenderness: There is abdominal tenderness in the suprapubic area. There is no guarding or rebound.  Neurological:     Mental Status: She is alert.      Results: Results for orders placed or performed in visit on 10/27/23 (from the past 24 hours)  POCT urinalysis dipstick     Status: None   Collection Time: 10/27/23  2:35 PM  Result Value Ref Range   Color, UA yellow    Clarity, UA clear    Glucose, UA Negative Negative   Bilirubin, UA negative    Ketones, UA negative    Spec Grav, UA 1.020 1.010 - 1.025   Blood, UA negative    pH, UA 5.0 5.0 - 8.0   Protein, UA Negative Negative   Urobilinogen, UA 0.2 0.2 or 1.0 E.U./dL   Nitrite, UA negative  Leukocytes, UA Negative Negative   Appearance     Odor    POCT urine pregnancy     Status: None   Collection Time: 10/27/23  2:35 PM  Result Value Ref Range   Preg Test, Ur Negative Negative     Assessment/Plan: Urinary frequency - Plan: POCT urinalysis dipstick, Urine Culture, Cervicovaginal ancillary only  Unprotected sex - Plan: POCT urine pregnancy  Vaginal discharge - Plan: Cervicovaginal ancillary only  Urine culture & aptima collected. Encouraged po hydration. Call with missed cycle or +HUPT. Start PNV, prescription provided.  No orders of the defined types were placed in this encounter.    Harlene LITTIE Cisco, CNM 10/27/2023 2:41 PM

## 2023-10-28 MED ORDER — PRENATAL VITAMIN 27-0.8 MG PO TABS: 1.0000 | ORAL_TABLET | Freq: Every day | ORAL | 3 refills | Status: AC

## 2023-10-29 ENCOUNTER — Other Ambulatory Visit: Payer: Self-pay | Admitting: Certified Nurse Midwife

## 2023-10-29 LAB — URINE CULTURE: Organism ID, Bacteria: NO GROWTH

## 2023-10-29 LAB — CERVICOVAGINAL ANCILLARY ONLY
Bacterial Vaginitis (gardnerella): NEGATIVE
Candida Glabrata: NEGATIVE
Candida Vaginitis: POSITIVE — AB
Comment: NEGATIVE
Comment: NEGATIVE
Comment: NEGATIVE

## 2023-10-29 MED ORDER — MICONAZOLE NITRATE 2 % VA CREA: 1.0000 | TOPICAL_CREAM | Freq: Every day | VAGINAL | 0 refills | Status: DC

## 2023-11-06 ENCOUNTER — Encounter: Payer: Self-pay | Admitting: Certified Nurse Midwife

## 2023-11-08 ENCOUNTER — Encounter: Payer: Self-pay | Admitting: Certified Nurse Midwife

## 2023-11-08 DIAGNOSIS — N939 Abnormal uterine and vaginal bleeding, unspecified: Secondary | ICD-10-CM

## 2023-11-09 ENCOUNTER — Other Ambulatory Visit: Payer: Medicaid Other

## 2023-11-09 DIAGNOSIS — N939 Abnormal uterine and vaginal bleeding, unspecified: Secondary | ICD-10-CM | POA: Diagnosis not present

## 2023-11-09 NOTE — Telephone Encounter (Signed)
Should we order beta?

## 2023-11-09 NOTE — Addendum Note (Signed)
Addended by: Sheliah Hatch on: 11/09/2023 02:23 PM   Modules accepted: Orders

## 2023-11-10 LAB — HUMAN CHORIONIC GONADOTROPIN(HCG),B-SUBUNIT,QUANTITATIVE): HCG, Beta Chain, Quant, S: <1 m[IU]/mL

## 2023-11-15 ENCOUNTER — Ambulatory Visit
Admission: EM | Admit: 2023-11-15 | Discharge: 2023-11-15 | Disposition: A | Discharge status: Home / Self Care | Payer: Medicaid Other | Attending: Emergency Medicine | Admitting: Emergency Medicine

## 2023-11-15 DIAGNOSIS — B349 Viral infection, unspecified: Secondary | ICD-10-CM | POA: Diagnosis not present

## 2023-11-15 LAB — POC COVID19/FLU A&B COMBO
Covid Antigen, POC: NEGATIVE
Influenza A Antigen, POC: NEGATIVE
Influenza B Antigen, POC: NEGATIVE
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
## 2023-11-15 NOTE — ED Triage Notes (Signed)
Patient to Urgent Care with complaints of  nasal congestion/ body aches/ weakness/ sore throat/ fatigue. Possible fevers.   Reports symptoms started Saturday morning.   Meds: cold and flu otc meds.

## 2023-11-15 NOTE — Discharge Instructions (Addendum)
The COVID and flu tests are negative.   Take Tylenol or ibuprofen as needed for fever or discomfort.  Rest and keep yourself hydrated.    Follow-up with your primary care provider if your symptoms are not improving.

## 2023-11-19 ENCOUNTER — Telehealth: Payer: Medicaid Other | Admitting: Family Medicine

## 2023-11-19 DIAGNOSIS — B9689 Other specified bacterial agents as the cause of diseases classified elsewhere: Secondary | ICD-10-CM

## 2023-11-19 DIAGNOSIS — J019 Acute sinusitis, unspecified: Secondary | ICD-10-CM

## 2023-11-19 MED ORDER — AMOXICILLIN-POT CLAVULANATE 875-125 MG PO TABS: 1.0000 | ORAL_TABLET | Freq: Two times a day (BID) | ORAL | 0 refills | Status: AC

## 2023-11-19 NOTE — Progress Notes (Signed)

## 2023-11-21 DIAGNOSIS — Z419 Encounter for procedure for purposes other than remedying health state, unspecified: Secondary | ICD-10-CM | POA: Diagnosis not present

## 2023-11-30 ENCOUNTER — Encounter: Payer: Self-pay | Admitting: Certified Nurse Midwife

## 2023-12-07 NOTE — Progress Notes (Deleted)
    PREGNANCY CONFIRMATION VISIT NOTE  Subjective:    Patient ID: Susan Klein, female    DOB: [birth year], 28 y.o.   MRN: [id number]  HPI  Patient is a 28 y.o. G98P1001 female who presents for evaluation of amenorrhea. She believes she could be pregnant. {pregnancy desire:14500::"Pregnancy is desired."} Sexual Activity: {sexual partners:705}. Current symptoms also include: {preg sx:18128}. Last period was {norm/abn:16337}.    Objective:    LMP 11/08/2023 (Exact Date)   Lab Review  No results found for any visits on 12/09/23.  Assessment:   No diagnosis found.  Plan:   {AOB + PREGNANCY PLAN:28596:p}  {AOB - PREGNANCY PLAN:28597:p}   Micia Roby DO

## 2023-12-07 NOTE — Patient Instructions (Incomplete)

## 2023-12-09 ENCOUNTER — Ambulatory Visit: Payer: Medicaid Other | Admitting: Obstetrics

## 2023-12-09 DIAGNOSIS — Z3200 Encounter for pregnancy test, result unknown: Secondary | ICD-10-CM

## 2023-12-10 ENCOUNTER — Ambulatory Visit: Payer: Medicaid Other

## 2023-12-11 ENCOUNTER — Ambulatory Visit: Payer: Medicaid Other | Admitting: Obstetrics

## 2023-12-11 ENCOUNTER — Encounter: Payer: Self-pay | Admitting: Certified Nurse Midwife

## 2023-12-11 VITALS — BP 133/85 | HR 93 | Ht 63.0 in | Wt 186.0 lb

## 2023-12-11 DIAGNOSIS — Z3200 Encounter for pregnancy test, result unknown: Secondary | ICD-10-CM

## 2023-12-11 DIAGNOSIS — Z3201 Encounter for pregnancy test, result positive: Secondary | ICD-10-CM | POA: Diagnosis not present

## 2023-12-11 DIAGNOSIS — N912 Amenorrhea, unspecified: Secondary | ICD-10-CM

## 2023-12-11 DIAGNOSIS — O3680X1 Pregnancy with inconclusive fetal viability, fetus 1: Secondary | ICD-10-CM

## 2023-12-11 LAB — POCT URINE PREGNANCY: Preg Test, Ur: POSITIVE — AB

## 2023-12-11 NOTE — Progress Notes (Signed)
    NURSE VISIT NOTE  Subjective:    Patient ID: Susan Klein, female    DOB: 1996/10/09, 28 y.o.   MRN: [id number]  HPI  Patient is a 28 y.o. G53P1001 female who presents for evaluation of amenorrhea. She believes she could be pregnant. Pregnancy is desired. Sexual Activity: multiple partners, contraception: none. Current symptoms also include: breast tenderness, fatigue, nausea, and positive home pregnancy test. Last period was normal.    Objective:    BP 133/85   Pulse 93   Ht 5\' 3"  (1.6 m)   Wt 186 lb (84.4 kg)   LMP 11/07/2023 (Exact Date)   BMI 32.95 kg/m   Lab Review  No results found for any visits on 12/11/23.  Assessment:   1. Possible pregnancy     Plan:   Pregnancy Test: Positive  Estimated Date of Delivery: None noted. BP Cuff Measurement taken. Cuff Size Adult Small Encouraged well-balanced diet, plenty of rest when needed, pre-natal vitamins daily and walking for exercise.  Discussed self-help for nausea, avoiding OTC medications until consulting provider or pharmacist, other than Tylenol as needed, minimal caffeine (1-2 cups daily) and avoiding alcohol.   She will schedule her nurse visit @ 7-[redacted] wks pregnant, u/s for dating @10  wk, and NOB visit at [redacted] wk pregnant.        Cornelius Moras, CMA

## 2023-12-19 DIAGNOSIS — Z419 Encounter for procedure for purposes other than remedying health state, unspecified: Secondary | ICD-10-CM | POA: Diagnosis not present

## 2023-12-30 ENCOUNTER — Encounter: Payer: Self-pay | Admitting: Certified Nurse Midwife

## 2024-01-01 ENCOUNTER — Telehealth (INDEPENDENT_AMBULATORY_CARE_PROVIDER_SITE_OTHER): Payer: Medicaid Other

## 2024-01-01 ENCOUNTER — Encounter: Payer: Self-pay | Admitting: Certified Nurse Midwife

## 2024-01-01 DIAGNOSIS — Z3480 Encounter for supervision of other normal pregnancy, unspecified trimester: Secondary | ICD-10-CM | POA: Insufficient documentation

## 2024-01-01 DIAGNOSIS — Z8679 Personal history of other diseases of the circulatory system: Secondary | ICD-10-CM | POA: Insufficient documentation

## 2024-01-01 DIAGNOSIS — Z3689 Encounter for other specified antenatal screening: Secondary | ICD-10-CM

## 2024-01-01 DIAGNOSIS — Z8759 Personal history of other complications of pregnancy, childbirth and the puerperium: Secondary | ICD-10-CM | POA: Insufficient documentation

## 2024-01-01 HISTORY — DX: Encounter for supervision of other normal pregnancy, unspecified trimester: Z34.80

## 2024-01-01 NOTE — Progress Notes (Signed)
 New OB Intake  I connected with  Susan Klein on 01/01/24 at  1:15 PM EDT by Video Visit and verified that I am speaking with the correct person using two identifiers. Nurse is located at [facility] and pt is located at home.  I discussed the limitations, risks, security and privacy concerns of performing an evaluation and management service by telephone and the availability of in person appointments. I also discussed with the patient that there may be a patient responsible charge related to this service. The patient expressed understanding and agreed to proceed.  I explained I am completing New OB Intake today. We discussed her EDD of 08/13/24 that is based on LMP of 11/07/23. Pt is G2/P1. I reviewed her allergies, medications, Medical/Surgical/OB history, and appropriate screenings. There are cats in the home: yes. If yes Indoor. Based on history, this is a/an pregnancy uncomplicated . Her obstetrical history is significant for  N/A .  Patient Active Problem List   Diagnosis Date Noted   Supervision of other normal pregnancy, antepartum 01/01/2024   Class 1 obesity with body mass index (BMI) of 32.0 to 32.9 in adult 03/22/2020   Cold sensitivity 11/16/2019   Moderate episode of recurrent major depressive disorder (HCC) 01/06/2019   Irritable bowel syndrome with constipation 01/06/2019   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    Concerns addressed today: None  Delivery Plans:  Plans to deliver at [facility].  Anatomy US Explained first scheduled [place] will be on 01/19/24. Anatomy US will be done at 20 weeks of gestational age.  Labs Discussed genetic screening with patient. Patient desires genetic testing to be drawn at new OB visit. Discussed possible labs to be drawn at new OB appointment.  COVID Vaccine Patient has not had COVID vaccine.   Social Determinants of Health Food Insecurity: denies food insecurity Transportation: Patient denies transportation  needs. Childcare: Discussed no children allowed at ultrasound appointments.   First visit review I reviewed new OB appt with pt. I explained she will have blood work and pap smear/pelvic exam if indicated. Explained pt will be seen by Guadlupe Spanish, CNM at first visit; encounter routed to appropriate provider.   Donnetta Hail, [facility] 01/01/2024  1:41 PM

## 2024-01-01 NOTE — Patient Instructions (Signed)
 Commonly Asked Questions During Pregnancy  Cats: A parasite can be excreted in cat feces.  To avoid exposure you need to have another person empty the little box.  If you must empty the litter box you will need to wear gloves.  Wash your hands after handling your cat.  This parasite can also be found in raw or undercooked meat so this should also be avoided.  Colds, Sore Throats, Flu: Please check your medication sheet to see what you can take for symptoms.  If your symptoms are unrelieved by these medications please call the office.  Dental Work: Most any dental work Agricultural consultant recommends is permitted.  X-rays should only be taken during the first trimester if absolutely necessary.  Your abdomen should be shielded with a lead apron during all x-rays.  Please notify your provider prior to receiving any x-rays.  Novocaine is fine; gas is not recommended.  If your dentist requires a note from [place] prior to dental work please call the office and we will provide one for you.  Exercise: Exercise is an important part of staying healthy during your pregnancy.  You may continue most exercises you were accustomed to prior to pregnancy.  Later in your pregnancy you will most likely notice you have difficulty with activities requiring balance like riding a bicycle.  It is important that you listen to your body and avoid activities that put you at a higher risk of falling.  Adequate rest and staying well hydrated are a must!  If you have questions about the safety of specific activities ask your provider.    Exposure to Children with illness: Try to avoid obvious exposure; report any symptoms to [place] when noted,  If you have chicken pos, red measles or mumps, you should be immune to these diseases.   Please do not take any vaccines while pregnant unless you have checked with your OB provider.  Fetal Movement: After 28 weeks we recommend you do "kick counts" twice daily.  Lie or sit down in a calm quiet environment and  count your baby movements "kicks".  You should feel your baby at least 10 times per hour.  If you have not felt 10 kicks within the first hour get up, walk around and have something sweet to eat or drink then repeat for an additional hour.  If count remains less than 10 per hour notify your provider.  Fumigating: Follow your pest control agent's advice as to how long to stay out of your home.  Ventilate the area well before re-entering.  Hemorrhoids:   Most over-the-counter preparations can be used during pregnancy.  Check your medication to see what is safe to use.  It is important to use a stool softener or fiber in your diet and to drink lots of liquids.  If hemorrhoids seem to be getting worse please call the office.   Hot Tubs:  Hot tubs Jacuzzis and saunas are not recommended while pregnant.  These increase your internal body temperature and should be avoided.  Intercourse:  Sexual intercourse is safe during pregnancy as long as you are comfortable, unless otherwise advised by your provider.  Spotting may occur after intercourse; report any bright red bleeding that is heavier than spotting.  Labor:  If you know that you are in labor, please go to the hospital.  If you are unsure, please call the office and let us help you decide what to do.  Lifting, straining, etc:  If your job requires heavy  lifting or straining please check with your provider for any limitations.  Generally, you should not lift items heavier than that you can lift simply with your hands and arms (no back muscles)  Painting:  Paint fumes do not harm your pregnancy, but may make you ill and should be avoided if possible.  Latex or water based paints have less odor than oils.  Use adequate ventilation while painting.  Permanents & Hair Color:  Chemicals in hair dyes are not recommended as they cause increase hair dryness which can increase hair loss during pregnancy.  " Highlighting" and permanents are allowed.  Dye may be  absorbed differently and permanents may not hold as well during pregnancy.  Sunbathing:  Use a sunscreen, as skin burns easily during pregnancy.  Drink plenty of fluids; avoid over heating.  Tanning Beds:  Because their possible side effects are still unknown, tanning beds are not recommended.  Ultrasound Scans:  Routine ultrasounds are performed at approximately 20 weeks.  You will be able to see your baby's general anatomy an if you would like to know the gender this can usually be determined as well.  If it is questionable when you conceived you may also receive an ultrasound early in your pregnancy for dating purposes.  Otherwise ultrasound exams are not routinely performed unless there is a medical necessity.  Although you can request a scan we ask that you pay for it when conducted because insurance does not cover " patient request" scans.  Work: If your pregnancy proceeds without complications you may work until your due date, unless your physician or employer advises otherwise.  Round Ligament Pain/Pelvic Discomfort:  Sharp, shooting pains not associated with bleeding are fairly common, usually occurring in the second trimester of pregnancy.  They tend to be worse when standing up or when you remain standing for long periods of time.  These are the result of pressure of certain pelvic ligaments called "round ligaments".  Rest, Tylenol and heat seem to be the most effective relief.  As the womb and fetus grow, they rise out of the pelvis and the discomfort improves.  Please notify the office if your pain seems different than that described.  It may represent a more serious condition.  Common Medications Safe in Pregnancy  Acne:      Constipation:  Benzoyl Peroxide     Colace  Clindamycin      Dulcolax Suppository  Topica Erythromycin     Fibercon  Salicylic Acid      Metamucil         Miralax AVOID:        Senakot   Accutane    Cough:  Retin-A       Cough  Drops  Tetracycline      Phenergan w/ Codeine if Rx  Minocycline      Robitussin (Plain & DM)  Antibiotics:     Crabs/Lice:  Ceclor       RID  Cephalosporins    AVOID:  E-Mycins      Kwell  Keflex  Macrobid/Macrodantin   Diarrhea:  Penicillin      Kao-Pectate  Zithromax      Imodium AD         PUSH FLUIDS AVOID:       Cipro     Fever:  Tetracycline      Tylenol (Regular or Extra  Minocycline       Strength)  Levaquin      Extra Strength-Do not  Exceed 8 tabs/24 hrs Caffeine:        200mg /day (equiv. To 1 cup of coffee or  approx. 3 12 oz sodas)         Gas: Cold/Hayfever:       Gas-X  Benadryl      Mylicon  Claritin       Phazyme  **Claritin-D        Chlor-Trimeton    Headaches:  Dimetapp      ASA-Free Excedrin  Drixoral-Non-Drowsy     Cold Compress  Mucinex (Guaifenasin)     Tylenol (Regular or Extra  Sudafed/Sudafed-12 Hour     Strength)  **Sudafed PE Pseudoephedrine   Tylenol Cold & Sinus     Vicks Vapor Rub  Zyrtec  **AVOID if Problems With Blood Pressure         Heartburn: Avoid lying down for at least 1 hour after meals  Aciphex      Maalox     Rash:  Milk of Magnesia     Benadryl    Mylanta       1% Hydrocortisone Cream  Pepcid  Pepcid Complete   Sleep Aids:  Prevacid      Ambien   Prilosec       Benadryl  Rolaids       Chamomile Tea  Tums (Limit 4/day)     Unisom         Tylenol PM         Warm milk-add vanilla or  Hemorrhoids:       Sugar for taste  Anusol/Anusol H.C.  (RX: Analapram 2.5%)  Sugar Substitutes:  Hydrocortisone OTC     Ok in moderation  Preparation H      Tucks        Vaseline lotion applied to tissue with wiping    Herpes:     Throat:  Acyclovir      Oragel  Famvir  Valtrex     Vaccines:         Flu Shot Leg Cramps:       *Gardasil  Benadryl      Hepatitis A         Hepatitis B Nasal Spray:       Pneumovax  Saline Nasal Spray     Polio Booster         Tetanus Nausea:       Tuberculosis test or PPD  Vitamin  B6 25 mg TID   AVOID:    Dramamine      *Gardasil  Emetrol       Live Poliovirus  Ginger Root 250 mg QID    MMR (measles, mumps &  High Complex Carbs @ Bedtime    rebella)  Sea Bands-Accupressure    Varicella (Chickenpox)  Unisom 1/2 tab TID     *No known complications           If received before Pain:         Known pregnancy;   Darvocet       Resume series after  Lortab        Delivery  Percocet    Yeast:   Tramadol      Femstat  Tylenol 3      Gyne-lotrimin  Ultram       Monistat  Vicodin           MISC:         All Sunscreens  Hair Coloring/highlights          Insect Repellant's          (Including DEET)         Mystic Tans Common Medications Safe in Pregnancy  Acne:      Constipation:  Benzoyl Peroxide     Colace  Clindamycin      Dulcolax Suppository  Topica Erythromycin     Fibercon  Salicylic Acid      Metamucil         Miralax AVOID:        Senakot   Accutane    Cough:  Retin-A       Cough Drops  Tetracycline      Phenergan w/ Codeine if Rx  Minocycline      Robitussin (Plain & DM)  Antibiotics:     Crabs/Lice:  Ceclor       RID  Cephalosporins    AVOID:  E-Mycins      Kwell  Keflex  Macrobid/Macrodantin   Diarrhea:  Penicillin      Kao-Pectate  Zithromax      Imodium AD         PUSH FLUIDS AVOID:       Cipro     Fever:  Tetracycline      Tylenol (Regular or Extra  Minocycline       Strength)  Levaquin      Extra Strength-Do not          Exceed 8 tabs/24 hrs Caffeine:        200mg /day (equiv. To 1 cup of coffee or  approx. 3 12 oz sodas)         Gas: Cold/Hayfever:       Gas-X  Benadryl      Mylicon  Claritin       Phazyme  **Claritin-D        Chlor-Trimeton    Headaches:  Dimetapp      ASA-Free Excedrin  Drixoral-Non-Drowsy     Cold Compress  Mucinex (Guaifenasin)     Tylenol (Regular or Extra  Sudafed/Sudafed-12 Hour     Strength)  **Sudafed PE Pseudoephedrine   Tylenol Cold & Sinus     Vicks Vapor Rub  Zyrtec  **AVOID if  Problems With Blood Pressure         Heartburn: Avoid lying down for at least 1 hour after meals  Aciphex      Maalox     Rash:  Milk of Magnesia     Benadryl    Mylanta       1% Hydrocortisone Cream  Pepcid  Pepcid Complete   Sleep Aids:  Prevacid      Ambien   Prilosec       Benadryl  Rolaids       Chamomile Tea  Tums (Limit 4/day)     Unisom         Tylenol PM         Warm milk-add vanilla or  Hemorrhoids:       Sugar for taste  Anusol/Anusol H.C.  (RX: Analapram 2.5%)  Sugar Substitutes:  Hydrocortisone OTC     Ok in moderation  Preparation H      Tucks        Vaseline lotion applied to tissue with wiping    Herpes:     Throat:  Acyclovir      Oragel  Famvir  Valtrex     Vaccines:  Flu Shot Leg Cramps:       *Gardasil  Benadryl      Hepatitis A         Hepatitis B Nasal Spray:       Pneumovax  Saline Nasal Spray     Polio Booster         Tetanus Nausea:       Tuberculosis test or PPD  Vitamin B6 25 mg TID   AVOID:    Dramamine      *Gardasil  Emetrol       Live Poliovirus  Ginger Root 250 mg QID    MMR (measles, mumps &  High Complex Carbs @ Bedtime    rebella)  Sea Bands-Accupressure    Varicella (Chickenpox)  Unisom 1/2 tab TID     *No known complications           If received before Pain:         Known pregnancy;   Darvocet       Resume series after  Lortab        Delivery  Percocet    Yeast:   Tramadol      Femstat  Tylenol 3      Gyne-lotrimin  Ultram       Monistat  Vicodin           MISC:         All Sunscreens           Hair Coloring/highlights          Insect Repellant's          (Including DEET)         Mystic Tans

## 2024-01-19 ENCOUNTER — Ambulatory Visit: Payer: Medicaid Other

## 2024-01-19 DIAGNOSIS — O3680X0 Pregnancy with inconclusive fetal viability, not applicable or unspecified: Secondary | ICD-10-CM

## 2024-01-19 DIAGNOSIS — Z3A10 10 weeks gestation of pregnancy: Secondary | ICD-10-CM | POA: Diagnosis not present

## 2024-01-19 DIAGNOSIS — Z3200 Encounter for pregnancy test, result unknown: Secondary | ICD-10-CM

## 2024-01-19 DIAGNOSIS — O3680X1 Pregnancy with inconclusive fetal viability, fetus 1: Secondary | ICD-10-CM

## 2024-01-27 ENCOUNTER — Telehealth: Admitting: Physician Assistant

## 2024-01-27 DIAGNOSIS — B9789 Other viral agents as the cause of diseases classified elsewhere: Secondary | ICD-10-CM | POA: Diagnosis not present

## 2024-01-27 DIAGNOSIS — J329 Chronic sinusitis, unspecified: Secondary | ICD-10-CM | POA: Diagnosis not present

## 2024-01-27 MED ORDER — FLUTICASONE PROPIONATE 50 MCG/ACT NA SUSP: 2.0000 | Freq: Every day | NASAL | 0 refills | Status: DC

## 2024-01-27 NOTE — Progress Notes (Signed)
 E-Visit for Sinus Problems  We are sorry that you are not feeling well.  Here is how we plan to help!  Based on what you have shared with me it looks like you have sinusitis.  Sinusitis is inflammation and infection in the sinus cavities of the head.  Based on your presentation I believe you most likely have Acute Viral Sinusitis.This is an infection most likely caused by a virus. There is not specific treatment for viral sinusitis other than to help you with the symptoms until the infection runs its course.  You may use an oral decongestant such as Mucinex D or if you have glaucoma or high blood pressure use plain Mucinex. Saline nasal spray help and can safely be used as often as needed for congestion, I have prescribed: Fluticasone nasal spray two sprays in each nostril once a day (safe in pregnancy).  Some authorities believe that zinc sprays or the use of Echinacea may shorten the course of your symptoms.  Sinus infections are not as easily transmitted as other respiratory infection, however we still recommend that you avoid close contact with loved ones, especially the very young and elderly.  Remember to wash your hands thoroughly throughout the day as this is the number one way to prevent the spread of infection!  Home Care: Only take medications as instructed by your medical team. Do not take these medications with alcohol. A steam or ultrasonic humidifier can help congestion.  You can place a towel over your head and breathe in the steam from hot water coming from a faucet. Avoid close contacts especially the very young and the elderly. Cover your mouth when you cough or sneeze. Always remember to wash your hands.  Get Help Right Away If: You develop worsening fever or sinus pain. You develop a severe head ache or visual changes. Your symptoms persist after you have completed your treatment plan.  Make sure you Understand these instructions. Will watch your condition. Will get  help right away if you are not doing well or get worse.   Thank you for choosing an e-visit.  Your e-visit answers were reviewed by a board certified advanced clinical practitioner to complete your personal care plan. Depending upon the condition, your plan could have included both over the counter or prescription medications.  Please review your pharmacy choice. Make sure the pharmacy is open so you can pick up prescription now. If there is a problem, you may contact your provider through [employer] and have the prescription routed to another pharmacy.  Your safety is important to [place]. If you have drug allergies check your prescription carefully.   For the next 24 hours you can use MyChart to ask questions about today's visit, request a non-urgent call back, or ask for a work or school excuse. You will get an email in the next two days asking about your experience. I hope that your e-visit has been valuable and will speed your recovery.     I have spent 5 minutes in review of e-visit questionnaire, review and updating patient chart, medical decision making and response to patient.   Margaretann Loveless, PA-C

## 2024-01-30 ENCOUNTER — Ambulatory Visit
Admission: RE | Admit: 2024-01-30 | Discharge: 2024-01-30 | Disposition: A | Discharge status: Home / Self Care | Payer: Self-pay | Source: Ambulatory Visit | Attending: Emergency Medicine | Admitting: Emergency Medicine

## 2024-01-30 ENCOUNTER — Other Ambulatory Visit: Payer: Self-pay

## 2024-01-30 VITALS — BP 117/64 | HR 103 | Temp 99.2°F | Resp 18

## 2024-01-30 DIAGNOSIS — H6501 Acute serous otitis media, right ear: Secondary | ICD-10-CM | POA: Diagnosis not present

## 2024-01-30 DIAGNOSIS — Z419 Encounter for procedure for purposes other than remedying health state, unspecified: Secondary | ICD-10-CM | POA: Diagnosis not present

## 2024-01-30 MED ORDER — AMOXICILLIN 500 MG PO CAPS
500.0000 mg | ORAL_CAPSULE | Freq: Two times a day (BID) | ORAL | 0 refills | Status: AC
Start: 2024-01-30 — End: 2024-02-09

## 2024-01-30 NOTE — ED Triage Notes (Signed)
 Patient presents to [facility] for evaluation of cough, sinus congestion and ear pain.  Seen on 4/9, diagnosed with sinusitis.  Feels like it is worsening, and the cough now causes chest pain.  Felt like her heart was racing when laying down to sleep last night
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
## 2024-01-30 NOTE — Discharge Instructions (Signed)
 Today you are being treated for an infection of the eardrum  Take amoxicillin twice daily for 10 days, you should begin to see improvement after 48 hours of medication use and then it should progressively get better  You may use Tylenol for management of discomfort  May hold warm compresses to the ear for additional comfort  Please not attempted any ear cleaning or object or fluid placement into the ear canal to prevent further irritation  Your symptoms today are most likely being caused by a virus and should steadily improve in time it can take up to 7 to 10 days before you truly start to see a turnaround however things will get better    You can take Tylenol and/or Ibuprofen as needed for fever reduction and pain relief.   For cough: honey 1/2 to 1 teaspoon (you can dilute the honey in water or another fluid).  You can also use dextromethorphan for cough. You can use a humidifier for chest congestion and cough.  If you don't have a humidifier, you can sit in the bathroom with the hot shower running.      For sore throat: try warm salt water gargles, cepacol lozenges, throat spray, warm tea or water with lemon/honey, popsicles or ice, or OTC cold relief medicine for throat discomfort.   For congestion: take a daily anti-histamine like Zyrtec, Claritin.  You can also use Flonase 1-2 sprays in each nostril daily.   It is important to stay hydrated: drink plenty of fluids (water, gatorade/powerade/pedialyte, juices, or teas) to keep your throat moisturized and help further relieve irritation/discomfort.

## 2024-02-05 ENCOUNTER — Encounter: Payer: Medicaid Other | Admitting: Obstetrics

## 2024-02-08 ENCOUNTER — Other Ambulatory Visit (HOSPITAL_COMMUNITY)
Admission: RE | Admit: 2024-02-08 | Discharge: 2024-02-08 | Disposition: A | Discharge status: Home / Self Care | Source: Ambulatory Visit | Attending: Obstetrics | Admitting: Obstetrics

## 2024-02-08 ENCOUNTER — Ambulatory Visit (INDEPENDENT_AMBULATORY_CARE_PROVIDER_SITE_OTHER): Admitting: Obstetrics

## 2024-02-08 VITALS — BP 117/78 | HR 87 | Wt 184.0 lb

## 2024-02-08 DIAGNOSIS — W5509XA Other contact with cat, initial encounter: Secondary | ICD-10-CM | POA: Insufficient documentation

## 2024-02-08 DIAGNOSIS — Z3481 Encounter for supervision of other normal pregnancy, first trimester: Secondary | ICD-10-CM

## 2024-02-08 DIAGNOSIS — Z131 Encounter for screening for diabetes mellitus: Secondary | ICD-10-CM

## 2024-02-08 DIAGNOSIS — Z13220 Encounter for screening for lipoid disorders: Secondary | ICD-10-CM

## 2024-02-08 DIAGNOSIS — Z3689 Encounter for other specified antenatal screening: Secondary | ICD-10-CM

## 2024-02-08 DIAGNOSIS — Z1379 Encounter for other screening for genetic and chromosomal anomalies: Secondary | ICD-10-CM

## 2024-02-08 DIAGNOSIS — Z0283 Encounter for blood-alcohol and blood-drug test: Secondary | ICD-10-CM | POA: Diagnosis present

## 2024-02-08 DIAGNOSIS — T7589XA Other specified effects of external causes, initial encounter: Secondary | ICD-10-CM

## 2024-02-08 DIAGNOSIS — Z3A13 13 weeks gestation of pregnancy: Secondary | ICD-10-CM | POA: Insufficient documentation

## 2024-02-08 DIAGNOSIS — Z2089 Contact with and (suspected) exposure to other communicable diseases: Secondary | ICD-10-CM | POA: Insufficient documentation

## 2024-02-08 DIAGNOSIS — Z113 Encounter for screening for infections with a predominantly sexual mode of transmission: Secondary | ICD-10-CM | POA: Insufficient documentation

## 2024-02-08 DIAGNOSIS — Z0184 Encounter for antibody response examination: Secondary | ICD-10-CM | POA: Diagnosis not present

## 2024-02-08 MED ORDER — NICOTINE 7 MG/24HR TD PT24: 7.0000 mg | MEDICATED_PATCH | Freq: Every day | TRANSDERMAL | 0 refills | Status: DC
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
## 2024-02-09 LAB — CBC/D/PLT+RPR+RH+ABO+RUBIGG...
Antibody Screen: NEGATIVE
Basophils Absolute: 0 10*3/uL (ref 0.0–0.2)
Basos: 0 %
EOS (ABSOLUTE): 0.1 10*3/uL (ref 0.0–0.4)
Eos: 1 %
HCV Ab: NONREACTIVE
HIV Screen 4th Generation wRfx: NONREACTIVE
Hematocrit: 36.6 % (ref 34.0–46.6)
Hemoglobin: 12.1 g/dL (ref 11.1–15.9)
Hepatitis B Surface Ag: NEGATIVE
Immature Grans (Abs): 0.1 10*3/uL (ref 0.0–0.1)
Immature Granulocytes: 1 %
Lymphocytes Absolute: 2.7 10*3/uL (ref 0.7–3.1)
Lymphs: 26 %
MCH: 30 pg (ref 26.6–33.0)
MCHC: 33.1 g/dL (ref 31.5–35.7)
MCV: 91 fL (ref 79–97)
Monocytes Absolute: 0.6 10*3/uL (ref 0.1–0.9)
Monocytes: 6 %
Neutrophils Absolute: 7 10*3/uL (ref 1.4–7.0)
Neutrophils: 66 %
Platelets: 257 10*3/uL (ref 150–450)
RBC: 4.03 x10E6/uL (ref 3.77–5.28)
RDW: 11.9 % (ref 11.7–15.4)
RPR Ser Ql: NONREACTIVE
Rh Factor: POSITIVE
Rubella Antibodies, IGG: 3.58 {index} (ref >0.99)
Varicella zoster IgG: REACTIVE
WBC: 10.6 10*3/uL (ref 3.4–10.8)

## 2024-02-09 LAB — PROTEIN / CREATININE RATIO, URINE
Creatinine, Urine: 149.9 mg/dL
Protein, Ur: 9.1 mg/dL
Protein/Creat Ratio: 61 mg/g{creat} (ref 0–200)

## 2024-02-09 LAB — HEMOGLOBIN A1C
Est. average glucose Bld gHb Est-mCnc: 94 mg/dL
Hgb A1c MFr Bld: 4.9 % (ref 4.8–5.6)

## 2024-02-09 LAB — COMPREHENSIVE METABOLIC PANEL WITH GFR
ALT: 11 IU/L (ref 0–32)
AST: 14 IU/L (ref 0–40)
Albumin: 4 g/dL (ref 4.0–5.0)
Alkaline Phosphatase: 61 IU/L (ref 44–121)
BUN/Creatinine Ratio: 15 (ref 9–23)
BUN: 8 mg/dL (ref 6–20)
Bilirubin Total: <0.2 mg/dL (ref 0.0–1.2)
CO2: 19 mmol/L — ABNORMAL LOW (ref 20–29)
Calcium: 9.5 mg/dL (ref 8.7–10.2)
Chloride: 105 mmol/L (ref 96–106)
Creatinine, Ser: 0.52 mg/dL — ABNORMAL LOW (ref 0.57–1.00)
Globulin, Total: 2 g/dL (ref 1.5–4.5)
Glucose: 75 mg/dL (ref 70–99)
Potassium: 4.1 mmol/L (ref 3.5–5.2)
Sodium: 139 mmol/L (ref 134–144)
Total Protein: 6 g/dL (ref 6.0–8.5)
eGFR: 132 mL/min/{1.73_m2} (ref >59)

## 2024-02-09 LAB — HCV INTERPRETATION

## 2024-02-09 LAB — TSH+FREE T4
Free T4: 0.78 ng/dL — ABNORMAL LOW (ref 0.82–1.77)
TSH: 0.946 u[IU]/mL (ref 0.450–4.500)

## 2024-02-09 LAB — URINALYSIS, ROUTINE W REFLEX MICROSCOPIC
Bilirubin, UA: NEGATIVE
Glucose, UA: NEGATIVE
Leukocytes,UA: NEGATIVE
Nitrite, UA: NEGATIVE
Protein,UA: NEGATIVE
RBC, UA: NEGATIVE
Specific Gravity, UA: >=1.03 — AB (ref 1.005–1.030)
Urobilinogen, Ur: 0.2 mg/dL (ref 0.2–1.0)
pH, UA: 6.5 (ref 5.0–7.5)

## 2024-02-09 LAB — TOXOPLASMA ANTIBODIES- IGG AND  IGM
Toxoplasma Antibody- IgM: <3 [AU]/ml (ref 0.0–7.9)
Toxoplasma IgG Ratio: <3 [IU]/mL (ref 0.0–7.1)

## 2024-02-10 LAB — MONITOR DRUG PROFILE 14(MW)
Amphetamine Scrn, Ur: NEGATIVE ng/mL
BARBITURATE SCREEN URINE: NEGATIVE ng/mL
BENZODIAZEPINE SCREEN, URINE: NEGATIVE ng/mL
Buprenorphine, Urine: NEGATIVE ng/mL
CANNABINOIDS UR QL SCN: NEGATIVE ng/mL
Cocaine (Metab) Scrn, Ur: NEGATIVE ng/mL
Creatinine(Crt), U: 143.4 mg/dL (ref 20.0–300.0)
Fentanyl, Urine: NEGATIVE pg/mL
Meperidine Screen, Urine: NEGATIVE ng/mL
Methadone Screen, Urine: NEGATIVE ng/mL
OXYCODONE+OXYMORPHONE UR QL SCN: NEGATIVE ng/mL
Opiate Scrn, Ur: NEGATIVE ng/mL
Ph of Urine: 6.1 (ref 4.5–8.9)
Phencyclidine Qn, Ur: NEGATIVE ng/mL
Propoxyphene Scrn, Ur: NEGATIVE ng/mL
SPECIFIC GRAVITY: 1.026
Tramadol Screen, Urine: NEGATIVE ng/mL

## 2024-02-10 LAB — CERVICOVAGINAL ANCILLARY ONLY
Chlamydia: NEGATIVE
Comment: NEGATIVE
Comment: NORMAL
Neisseria Gonorrhea: NEGATIVE

## 2024-02-10 LAB — URINE CULTURE, OB REFLEX: Organism ID, Bacteria: NO GROWTH

## 2024-02-10 LAB — CULTURE, OB URINE

## 2024-02-10 LAB — NICOTINE SCREEN, URINE: Cotinine Ql Scrn, Ur: POSITIVE ng/mL — AB

## 2024-02-13 LAB — MATERNIT 21 PLUS CORE, BLOOD
Fetal Fraction: 19
Result (T21): NEGATIVE
Trisomy 13 (Patau syndrome): NEGATIVE
Trisomy 18 (Edwards syndrome): NEGATIVE
Trisomy 21 (Down syndrome): NEGATIVE

## 2024-02-16 ENCOUNTER — Encounter: Payer: Self-pay | Admitting: Obstetrics

## 2024-02-17 NOTE — Addendum Note (Signed)
 Addended by: Arcelia Bean on: 02/17/2024 04:19 PM   Modules accepted: Orders

## 2024-02-29 DIAGNOSIS — Z419 Encounter for procedure for purposes other than remedying health state, unspecified: Secondary | ICD-10-CM | POA: Diagnosis not present

## 2024-03-07 ENCOUNTER — Ambulatory Visit (INDEPENDENT_AMBULATORY_CARE_PROVIDER_SITE_OTHER)

## 2024-03-07 VITALS — BP 135/77 | HR 101 | Wt 191.3 lb

## 2024-03-07 DIAGNOSIS — Z3480 Encounter for supervision of other normal pregnancy, unspecified trimester: Secondary | ICD-10-CM | POA: Diagnosis not present

## 2024-03-07 DIAGNOSIS — Z3A17 17 weeks gestation of pregnancy: Secondary | ICD-10-CM

## 2024-03-07 DIAGNOSIS — Z8679 Personal history of other diseases of the circulatory system: Secondary | ICD-10-CM | POA: Diagnosis not present

## 2024-03-07 NOTE — Progress Notes (Signed)
    Return Prenatal Note   Assessment/Plan   Plan  28 y.o. G2P1001 at [redacted]w[redacted]d presents for follow-up OB visit. Reviewed prenatal record including previous visit note.  Supervision of other normal pregnancy, antepartum - Anatomy US  scheduled for 6/9. - AFP and TSH/T4 collected today for follow up. - Reviewed red flag warning signs anticipatory guidance for upcoming prenatal care.   History of hypertension - Continues to take daily LDASA.   Orders Placed This Encounter  Procedures   TSH + Sasan Wilkie T4   AFP, Serum, Open Spina Bifida    Is patient insulin  dependent?:   No    Weight (lbs):   191    Gestational Age (GA), weeks:   42    Date on which patient was at this GA:   03/05/2024    GA Calculation Method:   LMP    GA Date:   08/13/2024    Number of fetuses:   1   Return in 4 weeks (on 04/04/2024) for ROB.   Future Appointments  Date Time Provider Department Center  03/28/2024  2:35 PM Amandalee Lacap, Verita Glassman, CNM [facility] None  03/28/2024  4:00 PM [facility]  [facility]  [facility]    For next visit:  continue with routine prenatal care     Subjective   27 y.o. G2P1001 at [redacted]w[redacted]d presents for this follow-up prenatal visit.  Patient has no concerns today.  Patient reports: Movement: Absent Contractions: Not present  Objective   Flow sheet Vitals: Pulse Rate: (!) 101 BP: 135/77 Fundal Height: 17 cm Fetal Heart Rate (bpm): 145 Total weight gain: 11 lb 4.8 oz (5.126 kg)  General Appearance  No acute distress, well appearing, and well nourished Pulmonary   Normal work of breathing Neurologic   Alert and oriented to person, place, and time Psychiatric   Mood and affect within normal limits  Verita Glassman Korbin Mapps, CNM  05/19/253:44 PM

## 2024-03-07 NOTE — Assessment & Plan Note (Signed)
-   Anatomy US  scheduled for 6/9. - AFP and TSH/T4 collected today for follow up. - Reviewed red flag warning signs anticipatory guidance for upcoming prenatal care.

## 2024-03-07 NOTE — Assessment & Plan Note (Signed)
-   Continues to take daily LDASA.

## 2024-03-10 ENCOUNTER — Ambulatory Visit: Payer: Self-pay

## 2024-03-10 LAB — TSH+FREE T4
Free T4: 0.73 ng/dL — ABNORMAL LOW (ref 0.82–1.77)
TSH: 0.997 u[IU]/mL (ref 0.450–4.500)

## 2024-03-10 LAB — AFP, SERUM, OPEN SPINA BIFIDA
AFP MoM: 0.82
AFP Value: 27.6 ng/mL
Gest. Age on Collection Date: 17.3 wk
Maternal Age At EDD: 25.8 a
OSBR Risk 1 IN: 10000
Test Results:: NEGATIVE
Weight: 191 [lb_av]

## 2024-03-28 ENCOUNTER — Ambulatory Visit (INDEPENDENT_AMBULATORY_CARE_PROVIDER_SITE_OTHER)

## 2024-03-28 ENCOUNTER — Other Ambulatory Visit

## 2024-03-28 ENCOUNTER — Ambulatory Visit
Admission: RE | Admit: 2024-03-28 | Discharge: 2024-03-28 | Disposition: A | Discharge status: Home / Self Care | Source: Ambulatory Visit | Attending: Obstetrics | Admitting: Obstetrics

## 2024-03-28 ENCOUNTER — Other Ambulatory Visit (HOSPITAL_COMMUNITY)
Admission: RE | Admit: 2024-03-28 | Discharge: 2024-03-28 | Disposition: A | Discharge status: Home / Self Care | Source: Ambulatory Visit

## 2024-03-28 VITALS — BP 136/85 | HR 105 | Wt 191.0 lb

## 2024-03-28 DIAGNOSIS — N898 Other specified noninflammatory disorders of vagina: Secondary | ICD-10-CM

## 2024-03-28 DIAGNOSIS — Z369 Encounter for antenatal screening, unspecified: Secondary | ICD-10-CM | POA: Diagnosis not present

## 2024-03-28 DIAGNOSIS — Z3689 Encounter for other specified antenatal screening: Secondary | ICD-10-CM | POA: Insufficient documentation

## 2024-03-28 DIAGNOSIS — Z3480 Encounter for supervision of other normal pregnancy, unspecified trimester: Secondary | ICD-10-CM

## 2024-03-28 DIAGNOSIS — Z3A21 21 weeks gestation of pregnancy: Secondary | ICD-10-CM | POA: Diagnosis not present

## 2024-03-28 NOTE — Assessment & Plan Note (Addendum)
-   Wet prep sent, will f/u with results and treatment if indicated.  - Anatomy US  scheduled today - Reviewed kick counts and preterm labor warning signs. Instructed to call office or come to hospital with persistent headache, vision changes, regular contractions, leaking of fluid, decreased fetal movement or vaginal bleeding.

## 2024-03-28 NOTE — Progress Notes (Signed)
    Return Prenatal Note   Assessment/Plan   Plan  28 y.o. G2P1001 at [redacted]w[redacted]d presents for follow-up OB visit. Reviewed prenatal record including previous visit note.  Supervision of other normal pregnancy, antepartum - Wet prep sent, will f/u with results and treatment if indicated.  - Anatomy US  scheduled today - Reviewed kick counts and preterm labor warning signs. Instructed to call office or come to hospital with persistent headache, vision changes, regular contractions, leaking of fluid, decreased fetal movement or vaginal bleeding.      No orders of the defined types were placed in this encounter.  No follow-ups on file.   Future Appointments  Date Time Provider Department Center  03/28/2024  4:00 PM [facility]  [facility]  [facility]    For next visit:  Routine prenatal care    Subjective  Feeing well overall, has been trying to wean self from vaping she feels it is going well. Doesn't keep vape on her anymore, couldn't quantify how much she vapes anymore. Reports work issues have resolved. Reports vaginal itching on and off for the past few weeks.   Movement: Present Contractions: Not present  Objective   Flow sheet Vitals: Pulse Rate: (!) 105 BP: 136/85 Fundal Height: 21 cm Fetal Heart Rate (bpm): 134 Total weight gain: 4.99 kg  General Appearance  No acute distress, well appearing, and well nourished Pulmonary   Normal work of breathing Neurologic   Alert and oriented to person, place, and time Psychiatric   Mood and affect within normal limits  Susan Klein, [facility] 03/28/24 2:52 PM

## 2024-03-30 ENCOUNTER — Ambulatory Visit: Payer: Self-pay

## 2024-03-30 DIAGNOSIS — N760 Acute vaginitis: Secondary | ICD-10-CM

## 2024-03-30 LAB — CERVICOVAGINAL ANCILLARY ONLY
Bacterial Vaginitis (gardnerella): POSITIVE — AB
Candida Glabrata: NEGATIVE
Candida Vaginitis: POSITIVE — AB
Comment: NEGATIVE
Comment: NEGATIVE
Comment: NEGATIVE

## 2024-03-30 MED ORDER — METRONIDAZOLE 500 MG PO TABS: 500.0000 mg | ORAL_TABLET | Freq: Two times a day (BID) | ORAL | 0 refills | Status: AC

## 2024-03-31 DIAGNOSIS — Z419 Encounter for procedure for purposes other than remedying health state, unspecified: Secondary | ICD-10-CM | POA: Diagnosis not present

## 2024-04-11 ENCOUNTER — Ambulatory Visit: Payer: Self-pay | Admitting: Obstetrics

## 2024-04-25 ENCOUNTER — Ambulatory Visit (INDEPENDENT_AMBULATORY_CARE_PROVIDER_SITE_OTHER): Admitting: Advanced Practice Midwife

## 2024-04-25 ENCOUNTER — Encounter: Payer: Self-pay | Admitting: Advanced Practice Midwife

## 2024-04-25 VITALS — BP 124/75 | HR 109 | Wt 194.8 lb

## 2024-04-25 DIAGNOSIS — Z3A24 24 weeks gestation of pregnancy: Secondary | ICD-10-CM | POA: Diagnosis not present

## 2024-04-25 DIAGNOSIS — Z113 Encounter for screening for infections with a predominantly sexual mode of transmission: Secondary | ICD-10-CM | POA: Diagnosis not present

## 2024-04-25 DIAGNOSIS — Z369 Encounter for antenatal screening, unspecified: Secondary | ICD-10-CM

## 2024-04-25 DIAGNOSIS — Z130 Encounter for screening for diseases of the blood and blood-forming organs and certain disorders involving the immune mechanism: Secondary | ICD-10-CM

## 2024-04-25 DIAGNOSIS — Z131 Encounter for screening for diabetes mellitus: Secondary | ICD-10-CM

## 2024-04-25 DIAGNOSIS — Z3480 Encounter for supervision of other normal pregnancy, unspecified trimester: Secondary | ICD-10-CM

## 2024-04-25 NOTE — Progress Notes (Addendum)
 Routine Prenatal Care Visit  Subjective  Susan Klein is a 28 y.o. G2P1001 at [redacted]w[redacted]d being seen today for ongoing prenatal care.  She is currently monitored for the following issues for this low-risk pregnancy and has Insomnia secondary to anxiety; Moderate episode of recurrent major depressive disorder (HCC); Irritable bowel syndrome with constipation; Class 1 obesity with body mass index (BMI) of 32.0 to 32.9 in adult; Cold sensitivity; Supervision of other normal pregnancy, antepartum; and History of gestational hypertension on their problem list.  ----------------------------------------------------------------------------------- Patient reports she is doing well. She denies headache, visual changes or epigastric pain. Recheck of BP is normal. She is taking baby ASA.   Contractions: Not present. Vag. Bleeding: None.  Movement: Present. Leaking Fluid denies.  ----------------------------------------------------------------------------------- The following portions of the patient's history were reviewed and updated as appropriate: allergies, current medications, past family history, past medical history, past social history, past surgical history and problem list. Problem list updated.  Objective  Blood pressure 124/75, pulse (!) 109, weight 194 lb 12.8 oz (88.4 kg), last menstrual period 11/07/2023. First BP elevated: 142/85 BP recheck: 124/75  Pregravid weight 180 lb (81.6 kg) Total Weight Gain 14 lb 12.8 oz (6.713 kg) Urinalysis: Urine Protein    Urine Glucose    Fetal Status: Fetal Heart Rate (bpm): 152 Fundal Height: 25 cm Movement: Present     General:  Alert, oriented and cooperative. Patient is in no acute distress.  Skin: Skin is warm and dry. No rash noted.   Cardiovascular: Normal heart rate noted  Respiratory: Normal respiratory effort, no problems with respiration noted  Abdomen: Soft, gravid, appropriate for gestational age. Pain/Pressure: Absent     Pelvic:  Cervical exam  deferred        Extremities: Normal range of motion.  Edema: None  Mental Status: Normal mood and affect. Normal behavior. Normal judgment and thought content.   Assessment   28 y.o. G2P1001 at [redacted]w[redacted]d by  08/13/2024, by Last Menstrual Period presenting for routine prenatal visit  Plan   SECOND Problems (from 01/01/24 to present)     Problem Noted Diagnosed Resolved   Supervision of other normal pregnancy, antepartum 01/01/2024 by Loralyn Sander, CMA  No   Overview Addendum 04/25/2024  3:46 PM by Lynda Bradley, CNM   Clinical Staff Provider  Office Location  [facility] Dating  By LMP c/w 11w u/s  Language  English Anatomy US     Flu Vaccine  Declined Genetic Screen  NIPS: negative, female  RSV Vaccine  N/A    Covid Vaccine  Declined    TDaP vaccine   Offer Hgb A1C or  GTT Early : Hgb A1C 4.9 Third trimester :   Covid    LAB RESULTS   Rhogam  O/Positive/-- (04/21 1537)  Blood Type O/Positive/-- (04/21 1537)   RSV  Antibody Negative (04/21 1537)  Feeding Plan Breastfeed Rubella 3.58 (04/21 1537)  Contraception Undecided RPR Non Reactive (04/21 1537)   Circumcision Yes HBsAg Negative (04/21 1537)   Pediatrician  [facility] HIV Non Reactive (04/21 1537)  Support Person Undecided Varicella Reactive (04/21 1537)  Prenatal Classes Maybe GBS  (For PCN allergy, check sensitivities)     Hep C Non Reactive (04/21 1537)   BTL Consent  Pap Diagnosis  Date Value Ref Range Status  12/24/2022   Final   - Negative for intraepithelial lesion or malignancy (NILM)    VBAC Consent  Hgb Electro      CF      SMA  Preterm labor symptoms and general obstetric precautions including but not limited to vaginal bleeding, contractions, leaking of fluid and fetal movement were reviewed in detail with the patient. Please refer to After Visit Summary for other counseling recommendations.   Return in about 4 weeks (around 05/23/2024) for 28 wk labs and rob.  Slater Rains,  CNM 04/25/2024 3:57 PM

## 2024-04-30 DIAGNOSIS — Z419 Encounter for procedure for purposes other than remedying health state, unspecified: Secondary | ICD-10-CM | POA: Diagnosis not present

## 2024-05-13 ENCOUNTER — Observation Stay
Admission: EM | Admit: 2024-05-13 | Discharge: 2024-05-13 | Disposition: A | Discharge status: Home / Self Care | Attending: Certified Nurse Midwife | Admitting: Obstetrics and Gynecology

## 2024-05-13 DIAGNOSIS — R109 Unspecified abdominal pain: Secondary | ICD-10-CM

## 2024-05-13 DIAGNOSIS — O26899 Other specified pregnancy related conditions, unspecified trimester: Secondary | ICD-10-CM

## 2024-05-13 DIAGNOSIS — O26892 Other specified pregnancy related conditions, second trimester: Secondary | ICD-10-CM | POA: Diagnosis not present

## 2024-05-13 DIAGNOSIS — Z3A26 26 weeks gestation of pregnancy: Secondary | ICD-10-CM

## 2024-05-13 HISTORY — DX: Other specified pregnancy related conditions, unspecified trimester: O26.899

## 2024-05-13 LAB — CBC
HCT: 35.2 % — ABNORMAL LOW (ref 36.0–46.0)
Hemoglobin: 11.5 g/dL — ABNORMAL LOW (ref 12.0–15.0)
MCH: 29.6 pg (ref 26.0–34.0)
MCHC: 32.7 g/dL (ref 30.0–36.0)
MCV: 90.7 fL (ref 80.0–100.0)
Platelets: 218 K/uL (ref 150–400)
RBC: 3.88 MIL/uL (ref 3.87–5.11)
RDW: 13.2 % (ref 11.5–15.5)
WBC: 9.8 K/uL (ref 4.0–10.5)
nRBC: 0 % (ref 0.0–0.2)

## 2024-05-13 LAB — URINALYSIS, ROUTINE W REFLEX MICROSCOPIC
Bacteria, UA: NONE SEEN
Bilirubin Urine: NEGATIVE
Glucose, UA: NEGATIVE mg/dL
Hgb urine dipstick: NEGATIVE
Ketones, ur: NEGATIVE mg/dL
Leukocytes,Ua: NEGATIVE
Nitrite: NEGATIVE
Protein, ur: 30 mg/dL — AB
Specific Gravity, Urine: 1.031 — ABNORMAL HIGH (ref 1.005–1.030)
pH: 5 (ref 5.0–8.0)

## 2024-05-13 LAB — COMPREHENSIVE METABOLIC PANEL WITH GFR
ALT: 10 U/L (ref 0–44)
AST: 19 U/L (ref 15–41)
Albumin: 3 g/dL — ABNORMAL LOW (ref 3.5–5.0)
Alkaline Phosphatase: 65 U/L (ref 38–126)
Anion gap: 10 (ref 5–15)
BUN: 7 mg/dL (ref 6–20)
CO2: 20 mmol/L — ABNORMAL LOW (ref 22–32)
Calcium: 9.2 mg/dL (ref 8.9–10.3)
Chloride: 109 mmol/L (ref 98–111)
Creatinine, Ser: 0.36 mg/dL — ABNORMAL LOW (ref 0.44–1.00)
GFR, Estimated: >60 mL/min (ref >60)
Glucose, Bld: 80 mg/dL (ref 70–99)
Potassium: 3.6 mmol/L (ref 3.5–5.1)
Sodium: 139 mmol/L (ref 135–145)
Total Bilirubin: 0.3 mg/dL (ref 0.0–1.2)
Total Protein: 6 g/dL — ABNORMAL LOW (ref 6.5–8.1)

## 2024-05-13 LAB — AMYLASE: Amylase: 87 U/L (ref 28–100)

## 2024-05-13 LAB — LIPASE, BLOOD: Lipase: 33 U/L (ref 11–51)

## 2024-05-13 MED ORDER — LACTATED RINGERS IV SOLN: 500.0000 mL | INTRAVENOUS | Status: DC | PRN

## 2024-05-13 MED ORDER — ACETAMINOPHEN 500 MG PO TABS: 1000.0000 mg | ORAL_TABLET | Freq: Four times a day (QID) | ORAL | Status: DC | PRN

## 2024-05-13 MED ORDER — SOD CITRATE-CITRIC ACID 500-334 MG/5ML PO SOLN: 30.0000 mL | ORAL | Status: DC | PRN

## 2024-05-13 MED ORDER — CYCLOBENZAPRINE HCL 5 MG PO TABS: 5.0000 mg | ORAL_TABLET | Freq: Three times a day (TID) | ORAL | 0 refills | Status: AC | PRN

## 2024-05-13 MED ORDER — ONDANSETRON HCL 4 MG/2ML IJ SOLN: 4.0000 mg | Freq: Four times a day (QID) | INTRAMUSCULAR | Status: DC | PRN

## 2024-05-13 NOTE — Discharge Summary (Signed)
 LABOR & DELIVERY OB TRIAGE NOTE  SUBJECTIVE  HPI Susan Klein is a 28 y.o. G2P1001 at [redacted]w[redacted]d who presents to Labor & Delivery for abdominal pain. The pain started earlier today around her belly button then worsened and spread to the right side of her abdomen. Denies nausea/vomiting/constipation/diarrhea, injury to abdomen, contractions, cramping, changes to vaginal discharge or vaginal bleeding. Endorses usual fetal movement.  Declines tylenol , prefers not to take medication in pregnancy.  OB History     Gravida  2   Para  1   Term  1   Preterm  0   AB  0   Living  1      SAB  0   IAB  0   Ectopic  0   Multiple  0   Live Births  1           Scheduled Meds: Continuous Infusions:  lactated ringers      PRN Meds:.acetaminophen , lactated ringers , ondansetron , sodium citrate-citric acid  OBJECTIVE  BP 137/76   Pulse 96   Temp 98.7 F (37.1 C) (Oral)   LMP 11/07/2023 (Exact Date)   General: A&Ox4, NAD Heart: regular rate Lungs: normal work of breathing Abdomen: soft, tender to RUQ, no rebound tenderness, +guarding. No suprapubic, RLQ, LLQ or LUQ. Cervical exam:   deferred  NST I reviewed the NST and it was reactive.  Baseline: 140 Variability: moderate Accelerations: present Decelerations:none Toco: quiet Category I  Results for orders placed or performed during the hospital encounter of 05/13/24 (from the past 24 hours)  CBC     Status: Abnormal   Collection Time: 05/13/24  9:36 PM  Result Value Ref Range   WBC 9.8 4.0 - 10.5 K/uL   RBC 3.88 3.87 - 5.11 MIL/uL   Hemoglobin 11.5 (L) 12.0 - 15.0 g/dL   HCT 64.7 (L) 63.9 - 53.9 %   MCV 90.7 80.0 - 100.0 fL   MCH 29.6 26.0 - 34.0 pg   MCHC 32.7 30.0 - 36.0 g/dL   RDW 86.7 88.4 - 84.4 %   Platelets 218 150 - 400 K/uL   nRBC 0.0 0.0 - 0.2 %  Comprehensive metabolic panel     Status: Abnormal   Collection Time: 05/13/24  9:36 PM  Result Value Ref Range   Sodium 139 135 - 145 mmol/L    Potassium 3.6 3.5 - 5.1 mmol/L   Chloride 109 98 - 111 mmol/L   CO2 20 (L) 22 - 32 mmol/L   Glucose, Bld 80 70 - 99 mg/dL   BUN 7 6 - 20 mg/dL   Creatinine, Ser 9.63 (L) 0.44 - 1.00 mg/dL   Calcium 9.2 8.9 - 89.6 mg/dL   Total Protein 6.0 (L) 6.5 - 8.1 g/dL   Albumin 3.0 (L) 3.5 - 5.0 g/dL   AST 19 15 - 41 U/L   ALT 10 0 - 44 U/L   Alkaline Phosphatase 65 38 - 126 U/L   Total Bilirubin 0.3 0.0 - 1.2 mg/dL   GFR, Estimated >39 >39 mL/min   Anion gap 10 5 - 15  Amylase     Status: None   Collection Time: 05/13/24  9:36 PM  Result Value Ref Range   Amylase 87 28 - 100 U/L  Lipase, blood     Status: None   Collection Time: 05/13/24  9:36 PM  Result Value Ref Range   Lipase 33 11 - 51 U/L  Urinalysis, Routine w reflex microscopic -Urine, Clean Catch  Status: Abnormal   Collection Time: 05/13/24  9:39 PM  Result Value Ref Range   Color, Urine YELLOW (A) YELLOW   APPearance HAZY (A) CLEAR   Specific Gravity, Urine 1.031 (H) 1.005 - 1.030   pH 5.0 5.0 - 8.0   Glucose, UA NEGATIVE NEGATIVE mg/dL   Hgb urine dipstick NEGATIVE NEGATIVE   Bilirubin Urine NEGATIVE NEGATIVE   Ketones, ur NEGATIVE NEGATIVE mg/dL   Protein, ur 30 (A) NEGATIVE mg/dL   Nitrite NEGATIVE NEGATIVE   Leukocytes,Ua NEGATIVE NEGATIVE   RBC / HPF 6-10 0 - 5 RBC/hpf   WBC, UA 0-5 0 - 5 WBC/hpf   Bacteria, UA NONE SEEN NONE SEEN   Squamous Epithelial / HPF 6-10 0 - 5 /HPF   Mucus PRESENT    Ca Oxalate Crys, UA PRESENT     No results found.  ASSESSMENT Impression  1) Pregnancy at G2P1001, [redacted]w[redacted]d, Estimated Date of Delivery: 08/13/24 2) Reassuring maternal/fetal status 3) MSK pain in pregnancy  PLAN Reviewed negative for infection, UA indicated dehydration. Likely MSK pain due to changes of pregnancy. Flexeril prescription offered & accepted, comfort measures reviewed. Reviewed signs/symptoms of infection, return to L&D for worsened pain. Harlene LITTIE Cisco, CNM 05/13/24  10:34 PM

## 2024-05-20 NOTE — Progress Notes (Signed)
    Return Prenatal Note   Subjective   28 y.o. G2P1001 at [redacted]w[redacted]d presents for this follow-up prenatal visit.  Patient: She is doing well. She has had some swelling in her ankles in the evening. TDAP/BTC/Medicaid forms completed today. Patient reports: Movement: Present Contractions: Not present  Objective   Flow sheet Vitals: Pulse Rate: (!) 102 BP: 125/76 Fundal Height: 29 cm Fetal Heart Rate (bpm): 130 Total weight gain: 20 lb 6.4 oz (9.253 kg)  General Appearance  No acute distress, well appearing, and well nourished Pulmonary   Normal work of breathing Neurologic   Alert and oriented to person, place, and time Psychiatric   Mood and affect within normal limits  Assessment/Plan   Plan  28 y.o. G2P1001 at [redacted]w[redacted]d presents for follow-up OB visit. Reviewed prenatal record including previous visit note.  History of gestational hypertension Reports taking daily ASA  Supervision of other normal pregnancy, antepartum 28 week labs drawn today. RhIG n/a RPR, CBC, HIV and 1-hour GTT sent Reviewed the importance of monitoring fetal movement as well as warning signs for pre-eclampsia and preterm labor.       Orders Placed This Encounter  Procedures   Tdap vaccine greater than or equal to 7yo IM   No follow-ups on file.   Future Appointments  Date Time Provider Department Center  06/06/2024  8:15 AM Dominic, Jinnie Jansky, CNM [facility] None    For next visit:  continue with routine prenatal care     Damien PARSLEY, CNM  05/24/2511:50 PM

## 2024-05-23 ENCOUNTER — Other Ambulatory Visit

## 2024-05-23 ENCOUNTER — Encounter: Payer: Self-pay | Admitting: Certified Nurse Midwife

## 2024-05-23 ENCOUNTER — Ambulatory Visit: Admitting: Certified Nurse Midwife

## 2024-05-23 VITALS — BP 125/76 | HR 102 | Wt 200.4 lb

## 2024-05-23 DIAGNOSIS — Z3480 Encounter for supervision of other normal pregnancy, unspecified trimester: Secondary | ICD-10-CM

## 2024-05-23 DIAGNOSIS — Z23 Encounter for immunization: Secondary | ICD-10-CM

## 2024-05-23 DIAGNOSIS — O09892 Supervision of other high risk pregnancies, second trimester: Secondary | ICD-10-CM | POA: Diagnosis not present

## 2024-05-23 DIAGNOSIS — Z369 Encounter for antenatal screening, unspecified: Secondary | ICD-10-CM

## 2024-05-23 DIAGNOSIS — Z3A28 28 weeks gestation of pregnancy: Secondary | ICD-10-CM

## 2024-05-23 DIAGNOSIS — Z3A27 27 weeks gestation of pregnancy: Secondary | ICD-10-CM | POA: Diagnosis not present

## 2024-05-23 DIAGNOSIS — Z8759 Personal history of other complications of pregnancy, childbirth and the puerperium: Secondary | ICD-10-CM

## 2024-05-23 DIAGNOSIS — Z131 Encounter for screening for diabetes mellitus: Secondary | ICD-10-CM

## 2024-05-23 DIAGNOSIS — Z113 Encounter for screening for infections with a predominantly sexual mode of transmission: Secondary | ICD-10-CM

## 2024-05-23 DIAGNOSIS — Z130 Encounter for screening for diseases of the blood and blood-forming organs and certain disorders involving the immune mechanism: Secondary | ICD-10-CM

## 2024-05-23 NOTE — Patient Instructions (Signed)
 Tdap (Tetanus, Diphtheria, Pertussis) Vaccine: What You Need to Know Many vaccine information statements are available in Spanish and other languages. See PromoAge.com.br. 1. Why get vaccinated? Tdap vaccine can prevent tetanus, diphtheria, and pertussis. Diphtheria and pertussis spread from person to person. Tetanus enters the body through cuts or wounds. TETANUS (T) causes painful stiffening of the muscles. Tetanus can lead to serious health problems, including being unable to open the mouth, having trouble swallowing and breathing, or death. DIPHTHERIA (D) can lead to difficulty breathing, heart failure, paralysis, or death. PERTUSSIS (aP), also known as "whooping cough," can cause uncontrollable, violent coughing that makes it hard to breathe, eat, or drink. Pertussis can be extremely serious especially in babies and young children, causing pneumonia, convulsions, brain damage, or death. In teens and adults, it can cause weight loss, loss of bladder control, passing out, and rib fractures from severe coughing. 2. Tdap vaccine Tdap is only for children 7 years and older, adolescents, and adults.  Adolescents should receive a single dose of Tdap, preferably at age 26 or 12 years. Pregnant people should get a dose of Tdap during every pregnancy, preferably during the early part of the third trimester, to help protect the newborn from pertussis. Infants are most at risk for severe, life-threatening complications from pertussis. Adults who have never received Tdap should get a dose of Tdap. Also, adults should receive a booster dose of either Tdap or Td (a different vaccine that protects against tetanus and diphtheria but not pertussis) every 10 years, or after 5 years in the case of a severe or dirty wound or burn. Tdap may be given at the same time as other vaccines. 3. Talk with your health care provider Tell your vaccine provider if the person getting the vaccine: Has had an allergic  reaction after a previous dose of any vaccine that protects against tetanus, diphtheria, or pertussis, or has any severe, life-threatening allergies Has had a coma, decreased level of consciousness, or prolonged seizures within 7 days after a previous dose of any pertussis vaccine (DTP, DTaP, or Tdap) Has seizures or another nervous system problem Has ever had Guillain-Barr Syndrome (also called "GBS") Has had severe pain or swelling after a previous dose of any vaccine that protects against tetanus or diphtheria In some cases, your health care provider may decide to postpone Tdap vaccination until a future visit. People with minor illnesses, such as a cold, may be vaccinated. People who are moderately or severely ill should usually wait until they recover before getting Tdap vaccine.  Your health care provider can give you more information. 4. Risks of a vaccine reaction Pain, redness, or swelling where the shot was given, mild fever, headache, feeling tired, and nausea, vomiting, diarrhea, or stomachache sometimes happen after Tdap vaccination. People sometimes faint after medical procedures, including vaccination. Tell your provider if you feel dizzy or have vision changes or ringing in the ears.  As with any medicine, there is a very remote chance of a vaccine causing a severe allergic reaction, other serious injury, or death. 5. What if there is a serious problem? An allergic reaction could occur after the vaccinated person leaves the clinic. If you see signs of a severe allergic reaction (hives, swelling of the face and throat, difficulty breathing, a fast heartbeat, dizziness, or weakness), call 9-1-1 and get the person to the nearest hospital. For other signs that concern you, call your health care provider.  Adverse reactions should be reported to the Vaccine Adverse Event Reporting  System (VAERS). Your health care provider will usually file this report, or you can do it yourself. Visit the  VAERS website at www.vaers.LAgents.no or call 437-731-6503. VAERS is only for reporting reactions, and VAERS staff members do not give medical advice. 6. The National Vaccine Injury Compensation Program The [employer] Vaccine Injury Compensation Program (VICP) is a federal program that was created to compensate people who may have been injured by certain vaccines. Claims regarding alleged injury or death due to vaccination have a time limit for filing, which may be as short as two years. Visit the VICP website at SpiritualWord.at or call (669) 837-1631 to learn about the program and about filing a claim. 7. How can I learn more? Ask your health care provider. Call your local or state health department. Visit the website of the Food and Drug Administration (FDA) for vaccine package inserts and additional information at FinderList.no. Contact the Centers for Disease Control and Prevention (CDC): Call (484) 483-2759 (1-800-CDC-INFO) or Visit CDC's website at PicCapture.uy. Source: CDC Vaccine Information Statement Tdap (Tetanus, Diphtheria, Pertussis) Vaccine (05/25/2020) This same material is available at FootballExhibition.com.br for no charge. This information is not intended to replace advice given to you by your health care provider. Make sure you discuss any questions you have with your health care provider. Document Revised: 01/21/2023 Document Reviewed: 11/21/2022 Elsevier Patient Education  2024 [employer].

## 2024-05-23 NOTE — Assessment & Plan Note (Signed)
 28 week labs drawn today. RhIG n/a RPR, CBC, HIV and 1-hour GTT sent Reviewed the importance of monitoring fetal movement as well as warning signs for pre-eclampsia and preterm labor.

## 2024-05-23 NOTE — Assessment & Plan Note (Signed)
 Reports taking daily ASA

## 2024-05-24 LAB — 28 WEEK RH+PANEL
Basophils Absolute: 0 x10E3/uL (ref 0.0–0.2)
Basos: 0 %
EOS (ABSOLUTE): 0.2 x10E3/uL (ref 0.0–0.4)
Eos: 2 %
Gestational Diabetes Screen: 146 mg/dL — ABNORMAL HIGH (ref 70–139)
HIV Screen 4th Generation wRfx: NONREACTIVE
Hematocrit: 35.2 % (ref 34.0–46.6)
Hemoglobin: 11.4 g/dL (ref 11.1–15.9)
Immature Grans (Abs): 0.1 x10E3/uL (ref 0.0–0.1)
Immature Granulocytes: 1 %
Lymphocytes Absolute: 1.8 x10E3/uL (ref 0.7–3.1)
Lymphs: 19 %
MCH: 30.2 pg (ref 26.6–33.0)
MCHC: 32.4 g/dL (ref 31.5–35.7)
MCV: 93 fL (ref 79–97)
Monocytes Absolute: 0.4 x10E3/uL (ref 0.1–0.9)
Monocytes: 4 %
Neutrophils Absolute: 6.9 x10E3/uL (ref 1.4–7.0)
Neutrophils: 74 %
Platelets: 203 x10E3/uL (ref 150–450)
RBC: 3.78 x10E6/uL (ref 3.77–5.28)
RDW: 12.8 % (ref 11.7–15.4)
RPR Ser Ql: NONREACTIVE
WBC: 9.4 x10E3/uL (ref 3.4–10.8)

## 2024-05-25 ENCOUNTER — Encounter: Payer: Self-pay | Admitting: Certified Nurse Midwife

## 2024-05-26 ENCOUNTER — Ambulatory Visit: Payer: Self-pay | Admitting: Advanced Practice Midwife

## 2024-05-26 ENCOUNTER — Other Ambulatory Visit: Payer: Self-pay | Admitting: Advanced Practice Midwife

## 2024-05-26 DIAGNOSIS — Z0289 Encounter for other administrative examinations: Secondary | ICD-10-CM

## 2024-05-26 DIAGNOSIS — R7309 Other abnormal glucose: Secondary | ICD-10-CM

## 2024-05-26 NOTE — Progress Notes (Signed)
3 hr gtt ordered. Message to patient. 

## 2024-05-30 ENCOUNTER — Other Ambulatory Visit

## 2024-05-30 DIAGNOSIS — R7309 Other abnormal glucose: Secondary | ICD-10-CM

## 2024-05-31 DIAGNOSIS — Z419 Encounter for procedure for purposes other than remedying health state, unspecified: Secondary | ICD-10-CM | POA: Diagnosis not present

## 2024-05-31 LAB — GESTATIONAL GLUCOSE TOLERANCE
Glucose, Fasting: 63 mg/dL — ABNORMAL LOW (ref 70–94)
Glucose, GTT - 1 Hour: 148 mg/dL (ref 70–179)
Glucose, GTT - 2 Hour: 118 mg/dL (ref 70–154)
Glucose, GTT - 3 Hour: 63 mg/dL — ABNORMAL LOW (ref 70–139)

## 2024-06-01 ENCOUNTER — Ambulatory Visit: Payer: Self-pay | Admitting: Advanced Practice Midwife

## 2024-06-02 ENCOUNTER — Telehealth: Payer: Self-pay

## 2024-06-02 NOTE — Telephone Encounter (Signed)
 I called [name] to ask questions about [name] forms

## 2024-06-06 ENCOUNTER — Ambulatory Visit (INDEPENDENT_AMBULATORY_CARE_PROVIDER_SITE_OTHER): Admitting: Licensed Practical Nurse

## 2024-06-06 ENCOUNTER — Encounter: Payer: Self-pay | Admitting: Licensed Practical Nurse

## 2024-06-06 VITALS — BP 127/85 | HR 105 | Wt 198.6 lb

## 2024-06-06 DIAGNOSIS — Z8759 Personal history of other complications of pregnancy, childbirth and the puerperium: Secondary | ICD-10-CM

## 2024-06-06 DIAGNOSIS — Z3480 Encounter for supervision of other normal pregnancy, unspecified trimester: Secondary | ICD-10-CM

## 2024-06-06 DIAGNOSIS — O368130 Decreased fetal movements, third trimester, not applicable or unspecified: Secondary | ICD-10-CM

## 2024-06-06 DIAGNOSIS — Z3A30 30 weeks gestation of pregnancy: Secondary | ICD-10-CM | POA: Diagnosis not present

## 2024-06-06 NOTE — Assessment & Plan Note (Signed)
-  Taking Baby ASA daily

## 2024-06-06 NOTE — Progress Notes (Signed)
    Return Prenatal Note   Subjective   28 y.o. G2P1001 at [redacted]w[redacted]d presents for this follow-up prenatal visit.  Patient  Patient reports:Doing well. Has hip pain especially at night when sleeping-does sleep with a billow between legs which helps. Feels tired all of the time. Mood has been good.  -Reports over the last few days she has not felt the baby move as much, normally she gets movement in the morning and at night. She does feel some movement, just not as frequent.   Movement: Present Contractions: Not present  Objective   Flow sheet Vitals: Pulse Rate: (!) 105 BP: 127/85 Fundal Height: 30 cm Fetal Heart Rate (bpm): 155 Total weight gain: 13 lb 9.6 oz (6.169 kg) Crisol KIMBERLYANN HOLLAR 1996-06-07 [redacted]w[redacted]d  Fetus A Non-Stress Test Interpretation for 06/06/24  Indication: Decreased Fetal Movement  Fetal Heart Rate A Mode: External Baseline Rate (A): 145 bpm Variability: Moderate Accelerations: 15 x 15 Decelerations: None Multiple birth?: No  Uterine Activity Mode: Toco Contraction Frequency (min): none  Interpretation (Fetal Testing) Nonstress Test Interpretation: Reactive Overall Impression: Reassuring for gestational age    General Appearance  No acute distress, well appearing, and well nourished Pulmonary   Normal work of breathing Neurologic   Alert and oriented to person, place, and time Psychiatric   Mood and affect within normal limits  Assessment/Plan   Plan  28 y.o. G2P1001 at [redacted]w[redacted]d presents for follow-up OB visit. Reviewed prenatal record including previous visit note.  Supervision of other normal pregnancy, antepartum -TWG 13 lbs, WNL,  reports prepregnancy wt was 185, adjusted in computer. Gained 25-30lbs in last pregnancy.  -Is not using Nicoderm patch, has significantly reduced her vaping use, does Vape daily but no longer has her  vape with her at all times, she purposely keeps it away from her.  -RNST -Warning signs reviewed    History of  gestational hypertension -Taking Baby ASA daily      No orders of the defined types were placed in this encounter.  Return in about 2 weeks (around 06/20/2024) for ROB.   No future appointments.  For next visit:  continue with routine prenatal care     JINNIE HERO [facility], CNM  08/18/258:37 AM

## 2024-06-06 NOTE — Assessment & Plan Note (Addendum)
-  TWG 13 lbs, WNL,  reports prepregnancy wt was 185, adjusted in computer. Gained 25-30lbs in last pregnancy.  -Is not using Nicoderm patch, has significantly reduced her vaping use, does Vape daily but no longer has her  vape with her at all times, she purposely keeps it away from her.  -RNST -Warning signs reviewed

## 2024-06-21 ENCOUNTER — Encounter: Payer: Self-pay | Admitting: Licensed Practical Nurse

## 2024-06-21 ENCOUNTER — Ambulatory Visit (INDEPENDENT_AMBULATORY_CARE_PROVIDER_SITE_OTHER): Admitting: Licensed Practical Nurse

## 2024-06-21 VITALS — BP 138/92 | HR 105 | Wt 201.3 lb

## 2024-06-21 DIAGNOSIS — O133 Gestational [pregnancy-induced] hypertension without significant proteinuria, third trimester: Secondary | ICD-10-CM | POA: Diagnosis not present

## 2024-06-21 DIAGNOSIS — Z3480 Encounter for supervision of other normal pregnancy, unspecified trimester: Secondary | ICD-10-CM

## 2024-06-21 DIAGNOSIS — Z3A32 32 weeks gestation of pregnancy: Secondary | ICD-10-CM | POA: Diagnosis not present

## 2024-06-21 NOTE — Assessment & Plan Note (Signed)
-  comfort measures for swelling reviewed  -TWG 16lbs, WNL -warning signs reviewed

## 2024-06-21 NOTE — Assessment & Plan Note (Signed)
-  PIH lab collected -POC reviewed, NST's  at 36 wks, IOL 37-39 weeks

## 2024-06-21 NOTE — Progress Notes (Unsigned)
    Return Prenatal Note   Subjective   28 y.o. G2P1001 at [redacted]w[redacted]d presents for this follow-up prenatal visit.  Patient  Patient reports: notices swelling in her hands when she first wakes up, also now has some swelling in her ankles. Today's BP elevated, she has previously had elevated BP's, she now meets criteria  for GHTN, Denies HA, visual disturbances or RUQ pain.  Movement: Present Contractions: Irritability  Objective   Flow sheet Vitals: Pulse Rate: (!) 105 BP: (!) 138/92 Fundal Height:  (32.5) Fetal Heart Rate (bpm): 140 Total weight gain: 16 lb 4.8 oz (7.394 kg)  General Appearance  No acute distress, well appearing, and well nourished Pulmonary   Normal work of breathing Neurologic   Alert and oriented to person, place, and time Psychiatric   Mood and affect within normal limits   Assessment/Plan   Plan  28 y.o. G2P1001 at [redacted]w[redacted]d presents for follow-up OB visit. Reviewed prenatal record including previous visit note.  Gestational hypertension -PIH lab collected -POC reviewed, NST's  at 36 wks, IOL 37-39 weeks   Supervision of other normal pregnancy, antepartum -comfort measures for swelling reviewed  -TWG 16lbs, WNL -warning signs reviewed      Orders Placed This Encounter  Procedures   CBC w/Diff/Platelet   Comprehensive metabolic panel with GFR   Protein / creatinine ratio, urine   Return in about 2 weeks (around 07/05/2024) for ROB.   Future Appointments  Date Time Provider Department Center  07/05/2024  8:15 AM Jayne Harlene CROME, CNM [facility] None    For next visit:  {[email or web address]}     JINNIE HERO [facility], CNM  09/02/252:07 PM

## 2024-06-22 ENCOUNTER — Ambulatory Visit: Payer: Self-pay | Admitting: Licensed Practical Nurse

## 2024-06-22 LAB — COMPREHENSIVE METABOLIC PANEL WITH GFR
ALT: 10 IU/L (ref 0–32)
AST: 13 IU/L (ref 0–40)
Albumin: 3.5 g/dL — ABNORMAL LOW (ref 4.0–5.0)
Alkaline Phosphatase: 117 IU/L (ref 44–121)
BUN/Creatinine Ratio: 13 (ref 9–23)
BUN: 6 mg/dL (ref 6–20)
Bilirubin Total: <0.2 mg/dL (ref 0.0–1.2)
CO2: 17 mmol/L — ABNORMAL LOW (ref 20–29)
Calcium: 9.2 mg/dL (ref 8.7–10.2)
Chloride: 104 mmol/L (ref 96–106)
Creatinine, Ser: 0.48 mg/dL — ABNORMAL LOW (ref 0.57–1.00)
Globulin, Total: 2.1 g/dL (ref 1.5–4.5)
Glucose: 83 mg/dL (ref 70–99)
Potassium: 3.9 mmol/L (ref 3.5–5.2)
Sodium: 138 mmol/L (ref 134–144)
Total Protein: 5.6 g/dL — ABNORMAL LOW (ref 6.0–8.5)
eGFR: 135 mL/min/1.73 (ref >59)

## 2024-06-22 LAB — CBC WITH DIFFERENTIAL/PLATELET
Basophils Absolute: 0 x10E3/uL (ref 0.0–0.2)
Basos: 0 %
EOS (ABSOLUTE): 0.2 x10E3/uL (ref 0.0–0.4)
Eos: 2 %
Hematocrit: 36.3 % (ref 34.0–46.6)
Hemoglobin: 11.6 g/dL (ref 11.1–15.9)
Immature Grans (Abs): 0 x10E3/uL (ref 0.0–0.1)
Immature Granulocytes: 0 %
Lymphocytes Absolute: 1.8 x10E3/uL (ref 0.7–3.1)
Lymphs: 19 %
MCH: 29.3 pg (ref 26.6–33.0)
MCHC: 32 g/dL (ref 31.5–35.7)
MCV: 92 fL (ref 79–97)
Monocytes Absolute: 0.6 x10E3/uL (ref 0.1–0.9)
Monocytes: 7 %
Neutrophils Absolute: 6.5 x10E3/uL (ref 1.4–7.0)
Neutrophils: 72 %
Platelets: 196 x10E3/uL (ref 150–450)
RBC: 3.96 x10E6/uL (ref 3.77–5.28)
RDW: 12.9 % (ref 11.7–15.4)
WBC: 9.1 x10E3/uL (ref 3.4–10.8)

## 2024-06-22 LAB — PROTEIN / CREATININE RATIO, URINE
Creatinine, Urine: 238.1 mg/dL
Protein, Ur: 30.7 mg/dL
Protein/Creat Ratio: 129 mg/g{creat} (ref 0–200)

## 2024-06-30 ENCOUNTER — Encounter: Payer: Self-pay | Admitting: Licensed Practical Nurse

## 2024-07-01 DIAGNOSIS — Z419 Encounter for procedure for purposes other than remedying health state, unspecified: Secondary | ICD-10-CM | POA: Diagnosis not present

## 2024-07-04 NOTE — Progress Notes (Unsigned)
    Return Prenatal Note   Subjective   28 y.o. G2P1001 at [redacted]w[redacted]d presents for this follow-up prenatal visit.  Patient feeling well, denies headache, visual changes (reports occasional floaters that resolve spontaneously), RUQ pain. Plan of care for IOL at 37-39w, likely in 38th week, reviewed. Having swelling to legs/feet, indented where socks were, denies sudden swelling to face/feet/hands. Patient reports: Movement: Present Contractions: Irritability  Objective   Flow sheet Vitals: Pulse Rate: (!) 105 BP: 134/83 Fundal Height: 33 cm Total weight gain: 19 lb 8 oz (8.845 kg)  General Appearance  No acute distress, well appearing, and well nourished Pulmonary   Normal work of breathing Neurologic   Alert and oriented to person, place, and time Psychiatric   Mood and affect within normal limits  Fetus A Non-Stress Test Interpretation for 07/05/24  Indication: Gestational Hypertension  Fetal Heart Rate A Mode: External Baseline Rate (A): 140 bpm Variability: Moderate Accelerations: 15 x 15 Decelerations: None Multiple birth?: No  Uterine Activity Mode: Toco Contraction Frequency (min): none noted Resting Tone Palpated: Relaxed Resting Time: Adequate  Interpretation (Fetal Testing) Nonstress Test Interpretation: Reactive Overall Impression: Reassuring for gestational age    Assessment/Plan   Plan  28 y.o. G2P1001 at [redacted]w[redacted]d presents for follow-up OB visit. Reviewed prenatal record including previous visit note.  Gestational hypertension Normotensive today. HELLP labs collected. RNST. Growth u/s & BPP next week. Plan IOL 37-39w. Continue weekly labs.  Supervision of other normal pregnancy, antepartum Reviewed labor warning signs and expectations for birth. Instructed to call office or come to hospital with persistent headache, vision changes, regular contractions, leaking of fluid, decreased fetal movement or vaginal bleeding.      Orders Placed This Encounter   Procedures   US  OB Comp + 14 Wk    Standing Status:   Future    Expected Date:   07/12/2024    Expiration Date:   07/05/2025    Reason for Exam (SYMPTOM  OR DIAGNOSIS REQUIRED):   growth, GHTN    Preferred Imaging Location?:   Internal   US  FETAL BPP WO NON STRESS    Standing Status:   Future    Expected Date:   07/12/2024    Expiration Date:   07/05/2025    Reason for exam::   GHTN    Preferred imaging location?:   Internal   Respiratory syncytial virus vaccine, preF, subunit, bivalent,(Abrysvo)   Flu vaccine trivalent PF, 6mos and older(Flulaval,Afluria,Fluarix,Fluzone)   CBC   Comprehensive metabolic panel with GFR   Protein / creatinine ratio, urine   Fetal nonstress test    Standing Status:   Future    Expiration Date:   07/04/2025   Return in 1 week (on 07/12/2024) for ROB & Ultrasound-growth & BPP.   Future Appointments  Date Time Provider Department Center  07/12/2024 10:30 AM AOB-A[facility] US  1 AOB-IMG None  07/12/2024 11:15 AM Jayne Raisin L, CNM AOB-A[facility] None  07/19/2024  9:15 AM AOB-NST ROOM AOB-AOB None  07/19/2024  9:55 AM Slaughterbeck, Damien, CNM AOB-AOB None  07/26/2024  9:15 AM AOB-NST ROOM AOB-AOB None  07/26/2024  9:35 AM Sebastian Sham, CNM AOB-A[facility] None  08/02/2024  9:15 AM AOB-NST ROOM AOB-AOB None  08/02/2024  9:55 AM Slaughterbeck, Damien, CNM AOB-AOB None     For next visit:  ROB with NST     Raisin LITTIE Jayne, CNM  07/05/2510:14 AM

## 2024-07-05 ENCOUNTER — Ambulatory Visit: Admitting: Certified Nurse Midwife

## 2024-07-05 VITALS — BP 134/83 | HR 105 | Wt 204.5 lb

## 2024-07-05 DIAGNOSIS — O133 Gestational [pregnancy-induced] hypertension without significant proteinuria, third trimester: Secondary | ICD-10-CM | POA: Diagnosis not present

## 2024-07-05 DIAGNOSIS — Z3A34 34 weeks gestation of pregnancy: Secondary | ICD-10-CM | POA: Diagnosis not present

## 2024-07-05 DIAGNOSIS — Z3480 Encounter for supervision of other normal pregnancy, unspecified trimester: Secondary | ICD-10-CM | POA: Diagnosis not present

## 2024-07-05 DIAGNOSIS — Z2911 Encounter for prophylactic immunotherapy for respiratory syncytial virus (RSV): Secondary | ICD-10-CM

## 2024-07-05 DIAGNOSIS — Z23 Encounter for immunization: Secondary | ICD-10-CM

## 2024-07-05 NOTE — Assessment & Plan Note (Addendum)
 Normotensive today. HELLP labs collected. RNST. Growth u/s & BPP next week. Plan IOL 37-39w. Continue weekly labs.

## 2024-07-05 NOTE — Patient Instructions (Addendum)
 RSV Vaccine: What You Need to Know Many vaccine information statements are available in Spanish and other languages. See ClassThemes.se. 1. Why get vaccinated? RSV vaccine can prevent lower respiratory tract disease caused by respiratory syncytial virus (RSV). RSV is a common respiratory virus that usually causes mild, cold-like symptoms. RSV can cause illness in people of all ages but may be especially serious for infants and older adults. RSV is the most common cause of hospitalization in [place] infants. Infants up to 6 months of age (especially those 6 months and younger) and children who were born prematurely, or who have chronic lung or heart disease, or a weakened immune system, are at increased risk of severe RSV disease. RSV infections can be dangerous for certain adults. Adults at highest risk for severe RSV disease include older adults, especially those with chronic heart or lung disease, a weakened immune system, certain other chronic medical conditions, or who live in nursing homes. RSV spreads through direct contact with the virus, such as when droplets from an infected person's cough or sneeze contact your eyes, nose, or mouth. It can also be spread by someone touching a surface, such as a doorknob, that has the virus on it, and then touching your face. Symptoms of RSV infection may include runny nose, decreased appetite, coughing, sneezing, fever, or wheezing. In very young infants, symptoms of RSV may also include irritability (fussiness), decreased activity, or apnea (pauses in breathing for more than 10 seconds). Most people recover in a week or two, but RSV can be more serious, resulting in shortness of breath and low oxygen levels. RSV can cause bronchiolitis (inflammation of the small airways in the lung) and pneumonia (infection of the lungs). RSV can also lead to worsening of other medical conditions such as asthma, chronic obstructive pulmonary disease (a chronic disease of the  lungs that makes it hard to breathe), or heart failure (when the heart cannot pump enough blood and oxygen throughout the body). Infants and older adults who get very sick from RSV may need to be hospitalized. Some may even die. 2. RSV vaccine There are two immunization options available for protecting infants against RSV: maternal vaccine for the pregnant person or preventive antibodies given to the baby. Only one of these options is needed for most babies to be protected. CDC recommends a one-time dose of RSV vaccine for pregnant people from week 32 through week 36 of pregnancy for the prevention of RSV disease in their infants during the first 6 months of life. This vaccine is recommended to be given from September through January for most of the [place] . However, in some locations (for example, the territories, [place] , [place] , and parts of [place] ), the timing of vaccination may differ based on the time of year when RSV circulates in the area. CDC recommends a one-time-dose of RSV vaccine for everyone 75 years and older and for adults 36 through 26 years of age who are at increased risk of severe RSV disease. Adults 54 through 26 years old who are at increased risk include those with chronic heart or lung disease, a weakened immune system, or certain other chronic medical conditions, and those who are residents of nursing homes. RSV vaccine may be given at the same time as other vaccines. 3. Talk with your health care provider Tell your vaccination provider if the person getting the vaccine: Has had an allergic reaction after a previous dose of RSV vaccine, or has any severe, life-threatening allergies In some cases, your health  care provider may decide to postpone RSV vaccination until a future visit.  People with minor illnesses, such as a cold, may be vaccinated. People who are moderately or severely ill should usually wait until they recover before getting RSV vaccine.  Your health care  provider can give you more information. 4. Risks of a vaccine reaction Pain, redness, and swelling where the shot is given, fatigue (feeling tired), fever, headache, nausea, diarrhea, and muscle or joint pain can happen after RSV vaccination. Serious neurologic conditions, including Guillain-Barr syndrome (GBS), have been reported after RSV vaccination in some older adults. At this time, an increased risk of GBS following RSV vaccine among persons aged 29 years and older cannot be confirmed or ruled out. Preterm birth and high blood pressure during pregnancy, including pre-eclampsia, have been reported among pregnant people who received RSV vaccine. It is unclear whether these events were caused by the vaccine. People sometimes faint after medical procedures, including vaccination. Tell your provider if you feel dizzy or have vision changes or ringing in the ears.  As with any medicine, there is a very remote chance of a vaccine causing a severe allergic reaction, other serious injury, or death. V-Safe is a safety monitoring system that lets you share with CDC how you, or your dependent, feel after getting RSV vaccine. You can find information and enroll in V-Safe RadarLocations.no. 5. What if there is a serious problem? An allergic reaction could occur after the vaccinated person leaves the clinic. If you see signs of a severe allergic reaction (hives, swelling of the face and throat, difficulty breathing, a fast heartbeat, dizziness, or weakness), call 9-1-1 and get the person to the nearest hospital. For other signs that concern you, call your health care provider.  Adverse reactions should be reported to the Vaccine Adverse Event Reporting System (VAERS). Your health care provider will usually file this report, or you can do it yourself. Visit the VAERS website at www.vaers.LAgents.no or call (212) 397-0435. VAERS is only for reporting reactions, and VAERS staff members do not give medical advice. 6. How  can I learn more? Ask your health care provider. Call your local or state health department. Visit the website of the Food and Drug Administration (FDA) for vaccine package inserts and additional information at FinderList.no Contact the Centers for Disease Control and Prevention (CDC): Call 571-603-9926 (1-800-CDC-INFO) or Visit CDC's vaccine website at PicCapture.uy Source: CDC Vaccine Information Statement RSV (Respiratory Syncytial Virus) Vaccine (08/06/2023) This same material is available at TonerPromos.no for no charge. This information is not intended to replace advice given to you by your health care provider. Make sure you discuss any questions you have with your health care provider. Document Revised: 09/01/2023 Document Reviewed: 10/22/2022 Elsevier Patient Education  2024 Elsevier Inc.  Influenza (Flu) Vaccine (Inactivated or Recombinant): What You Need to Know Many vaccine information statements are available in Spanish and other languages. See PromoAge.com.br. 1. Why get vaccinated? Influenza vaccine can prevent influenza (flu). Flu is a contagious disease that spreads around the [place]  every year, usually between October and May. Anyone can get the flu, but it is more dangerous for some people. Infants and young children, people 56 years and older, pregnant people, and people with certain health conditions or a weakened immune system are at greatest risk of flu complications. Pneumonia, bronchitis, sinus infections, and ear infections are examples of flu-related complications. If you have a medical condition, such as heart disease, cancer, or diabetes, flu can make it worse. Flu can  cause fever and chills, sore throat, muscle aches, fatigue, cough, headache, and runny or stuffy nose. Some people may have vomiting and diarrhea, though this is more common in children than adults. In an average year, thousands of people in the [place]  States die from flu, and many more are hospitalized. Flu vaccine prevents millions of illnesses and flu-related visits to the doctor each year. 2. Influenza vaccines CDC recommends everyone 6 months and older get vaccinated every flu season. Children 6 months through 62 years of age may need 2 doses during a single flu season. Everyone else needs only 1 dose each flu season. It takes about 2 weeks for protection to develop after vaccination. There are many flu viruses, and they are always changing. Each year a new flu vaccine is made to protect against the influenza viruses believed to be likely to cause disease in the upcoming flu season. Even when the vaccine doesn't exactly match these viruses, it may still provide some protection. Influenza vaccine does not cause flu. Influenza vaccine may be given at the same time as other vaccines. 3. Talk with your health care provider Tell your vaccination provider if the person getting the vaccine: Has had an allergic reaction after a previous dose of influenza vaccine, or has any severe, life-threatening allergies Has ever had Guillain-Barr Syndrome (also called GBS) In some cases, your health care provider may decide to postpone influenza vaccination until a future visit. Influenza vaccine can be administered at any time during pregnancy. People who are or will be pregnant during influenza season should receive inactivated influenza vaccine. People with minor illnesses, such as a cold, may be vaccinated. People who are moderately or severely ill should usually wait until they recover before getting influenza vaccine. Your health care provider can give you more information. 4. Risks of a vaccine reaction Soreness, redness, and swelling where the shot is given, fever, muscle aches, and headache can happen after influenza vaccination. There may be a very small increased risk of Guillain-Barr Syndrome (GBS) after inactivated influenza vaccine (the flu  shot). Young children who get the flu shot along with pneumococcal vaccine (PCV13) and/or DTaP vaccine at the same time might be slightly more likely to have a seizure caused by fever. Tell your health care provider if a child who is getting flu vaccine has ever had a seizure. People sometimes faint after medical procedures, including vaccination. Tell your provider if you feel dizzy or have vision changes or ringing in the ears. As with any medicine, there is a very remote chance of a vaccine causing a severe allergic reaction, other serious injury, or death. 5. What if there is a serious problem? An allergic reaction could occur after the vaccinated person leaves the clinic. If you see signs of a severe allergic reaction (hives, swelling of the face and throat, difficulty breathing, a fast heartbeat, dizziness, or weakness), call 9-1-1 and get the person to the nearest hospital. For other signs that concern you, call your health care provider. Adverse reactions should be reported to the Vaccine Adverse Event Reporting System (VAERS). Your health care provider will usually file this report, or you can do it yourself. Visit the VAERS website at www.vaers.LAgents.no or call 639 870 6751. VAERS is only for reporting reactions, and VAERS staff members do not give medical advice. 6. The National Vaccine Injury Compensation Program The [employer] Vaccine Injury Compensation Program (VICP) is a federal program that was created to compensate people who may have been injured by certain vaccines. Claims  regarding alleged injury or death due to vaccination have a time limit for filing, which may be as short as two years. Visit the VICP website at SpiritualWord.at or call (614)647-6912 to learn about the program and about filing a claim. 7. How can I learn more? Ask your health care provider. Call your local or state health department. Visit the website of the Food and Drug Administration (FDA) for  vaccine package inserts and additional information at FinderList.no. Contact the Centers for Disease Control and Prevention (CDC): Call 234-104-0390 (1-800-CDC-INFO) or Visit CDC's website at BiotechRoom.com.cy. Source: CDC Vaccine Information Statement Inactivated Influenza Vaccine (05/25/2020) This same material is available at FootballExhibition.com.br for no charge. This information is not intended to replace advice given to you by your health care provider. Make sure you discuss any questions you have with your health care provider. Document Revised: 01/21/2023 Document Reviewed: 10/27/2022 Elsevier Patient Education  2024 Elsevier Inc. Signs and Symptoms of Labor Labor is the body's natural process of moving the baby and the placenta out of the uterus. The process of labor usually starts when the baby is full-term, between 3 and 41 weeks of pregnancy. Signs and symptoms that you are close to going into labor As your body prepares for labor and the birth of your baby, you may notice the following symptoms in the weeks and days before true labor starts: Passing a small amount of thick, bloody mucus from your vagina. This is called normal bloody show or losing your mucus plug. This may happen more than a week before labor begins, or right before labor begins, as the opening of the cervix starts to widen (dilate). For some women, the entire mucus plug passes at once. For others, pieces of the mucus plug may gradually pass over several days. Your baby moving (dropping) lower in your pelvis to get into position for birth (lightening). When this happens, you may feel more pressure on your bladder and pelvic bone and less pressure on your ribs. This may make it easier to breathe. It may also cause you to need to urinate more often and have problems with bowel movements. Having practice contractions, also called Braxton Hicks contractions or false labor. These occur at irregular  (unevenly spaced) intervals that are more than 10 minutes apart. False labor contractions are common after exercise or sexual activity. They will stop if you change position, rest, or drink fluids. These contractions are usually mild and do not get stronger over time. They may feel like: A backache or back pain. Mild cramps, similar to menstrual cramps. Tightening or pressure in your abdomen. Other early symptoms include: Nausea or loss of appetite. Diarrhea. Having a sudden burst of energy, or feeling very tired. Mood changes. Having trouble sleeping. Signs and symptoms that labor has begun Signs that you are in labor may include: Having contractions that come at regular (evenly spaced) intervals and increase in intensity. This may feel like more intense tightening or pressure in your abdomen that moves to your back. Contractions may also feel like rhythmic pain in your upper thighs or back that comes and goes at regular intervals. If you are delivering for the first time, this change in intensity of contractions often occurs at a more gradual pace. If you have given birth before, you may notice a more rapid progression of contraction changes. Feeling pressure in the vaginal area. Your water breaking (rupture of membranes). This is when the sac of fluid that surrounds your baby breaks. Fluid leaking from your  vagina may be clear or blood-tinged. Labor usually starts within 24 hours of your water breaking, but it may take longer to begin. Some people may feel a sudden gush of fluid; others may notice repeatedly damp underwear. Follow these instructions at home:  When labor starts, or if your water breaks, call your health care provider or nurse care line. Based on your situation, they will determine when you should go in for an exam. During early labor, you may be able to rest and manage symptoms at home. Some strategies to try at home include: Breathing and relaxation techniques. Taking a  warm bath or shower. Listening to music. Using a heating pad on the lower back for pain. If directed, apply heat to the area as often as told by your health care provider. Use the heat source that your health care provider recommends, such as a moist heat pack or a heating pad. Place a towel between your skin and the heat source. Leave the heat on for 20-30 minutes. Remove the heat if your skin turns bright red. This is especially important if you are unable to feel pain, heat, or cold. You have a greater risk of getting burned. Contact a health care provider if: Your labor has started. Your water breaks. You have nausea, vomiting, or diarrhea. Get help right away if: You have painful, regular contractions that are 5 minutes apart or less. Labor starts before you are [redacted] weeks along in your pregnancy. You have a fever. You have bright red blood coming from your vagina. You do not feel your baby moving. You have a severe headache with or without vision problems. You have chest pain or shortness of breath. These symptoms may represent a serious problem that is an emergency. Do not wait to see if the symptoms will go away. Get medical help right away. Call your local emergency services (911 in the [place] Do not drive yourself to the hospital. Summary Labor is your body's natural process of moving your baby and the placenta out of your uterus. The process of labor usually starts when your baby is full-term, between 90 and 40 weeks of pregnancy. When labor starts, or if your water breaks, call your health care provider or nurse care line. Based on your situation, they will determine when you should go in for an exam. This information is not intended to replace advice given to you by your health care provider. Make sure you discuss any questions you have with your health care provider. Document Revised: 02/19/2021 Document Reviewed: 02/19/2021 Elsevier Patient Education  2024 [employer].

## 2024-07-05 NOTE — Assessment & Plan Note (Signed)
 Reviewed labor warning signs and expectations for birth. Instructed to call office or come to hospital with persistent headache, vision changes, regular contractions, leaking of fluid, decreased fetal movement or vaginal bleeding.

## 2024-07-06 ENCOUNTER — Ambulatory Visit: Payer: Self-pay | Admitting: Certified Nurse Midwife

## 2024-07-06 LAB — CBC
Hematocrit: 35 % (ref 34.0–46.6)
Hemoglobin: 11.4 g/dL (ref 11.1–15.9)
MCH: 29.1 pg (ref 26.6–33.0)
MCHC: 32.6 g/dL (ref 31.5–35.7)
MCV: 89 fL (ref 79–97)
Platelets: 184 x10E3/uL (ref 150–450)
RBC: 3.92 x10E6/uL (ref 3.77–5.28)
RDW: 12.9 % (ref 11.7–15.4)
WBC: 9.1 x10E3/uL (ref 3.4–10.8)

## 2024-07-06 LAB — COMPREHENSIVE METABOLIC PANEL WITH GFR
ALT: 10 IU/L (ref 0–32)
AST: 10 IU/L (ref 0–40)
Albumin: 3.4 g/dL — ABNORMAL LOW (ref 4.0–5.0)
Alkaline Phosphatase: 122 IU/L — ABNORMAL HIGH (ref 41–116)
BUN/Creatinine Ratio: 11 (ref 9–23)
BUN: 5 mg/dL — ABNORMAL LOW (ref 6–20)
Bilirubin Total: <0.2 mg/dL (ref 0.0–1.2)
CO2: 18 mmol/L — ABNORMAL LOW (ref 20–29)
Calcium: 8.8 mg/dL (ref 8.7–10.2)
Chloride: 108 mmol/L — ABNORMAL HIGH (ref 96–106)
Creatinine, Ser: 0.44 mg/dL — ABNORMAL LOW (ref 0.57–1.00)
Globulin, Total: 2 g/dL (ref 1.5–4.5)
Glucose: 67 mg/dL — ABNORMAL LOW (ref 70–99)
Potassium: 4.2 mmol/L (ref 3.5–5.2)
Sodium: 139 mmol/L (ref 134–144)
Total Protein: 5.4 g/dL — ABNORMAL LOW (ref 6.0–8.5)
eGFR: 138 mL/min/1.73 (ref >59)

## 2024-07-06 LAB — PROTEIN / CREATININE RATIO, URINE
Creatinine, Urine: 135.5 mg/dL
Protein, Ur: 22.3 mg/dL
Protein/Creat Ratio: 165 mg/g{creat} (ref 0–200)

## 2024-07-11 NOTE — Progress Notes (Unsigned)
    Return Prenatal Note   Subjective   28 y.o. G2P1001 at [redacted]w[redacted]d presents for this follow-up prenatal visit.  Patient feeling well, active baby. Denies headache, visual changes, RUQ pain. Preliminary ultrasound report reviewed, BPP 8/8, EFW 68%, AC 80%. Feels she lost her mucous plug, would like cervical exam today to know if she is dilated. Patient reports: Movement: Present Contractions: Irritability  Objective   Flow sheet Vitals: Pulse Rate: 100 BP: (!) 146/85 Fundal Height: 35 cm Dilation: 3 Effacement (%): 40 Station: -2 Total weight gain: 20 lb 11.2 oz (9.389 kg)  General Appearance  No acute distress, well appearing, and well nourished Pulmonary   Normal work of breathing Neurologic   Alert and oriented to person, place, and time Psychiatric   Mood and affect within normal limits  Assessment/Plan   Plan  28 y.o. G2P1001 at [redacted]w[redacted]d presents for follow-up OB visit. Reviewed prenatal record including previous visit note.  Supervision of other normal pregnancy, antepartum Reviewed kick counts and preterm labor warning signs. Instructed to call office or come to hospital with persistent headache, vision changes, regular contractions, leaking of fluid, decreased fetal movement or vaginal bleeding. GBS/Gc/Ct collected today given plan for 38w IOL due to [facility].  Gestational hypertension BP mild range today, asymptomatic. BPP 8/8. Continue weekly visits with NST & labs. Pre-eclampsia precautions reinforced. IOL scheduled 8/12 5am at 38+1.      Orders Placed This Encounter  Procedures   Culture, beta strep (group b only)   CBC   Comprehensive metabolic panel with GFR   Protein / creatinine ratio, urine   Return in 1 week (on 07/19/2024) for ROB & NST.   Future Appointments  Date Time Provider Department Center  07/19/2024  9:15 AM AOB-NST ROOM AOB-AOB None  07/19/2024  9:55 AM Slaughterbeck, Damien, CNM AOB-AOB None  07/26/2024  9:15 AM AOB-NST ROOM AOB-AOB None   07/26/2024  9:35 AM Sebastian Sham, CNM AOB-AOB None  08/02/2024  9:15 AM AOB-NST ROOM AOB-AOB None  08/02/2024  9:55 AM Slaughterbeck, Damien, CNM AOB-AOB None    For next visit:  ROB with NST     Susan Klein Cisco, CNM  07/13/2511:03 PM

## 2024-07-12 ENCOUNTER — Ambulatory Visit (INDEPENDENT_AMBULATORY_CARE_PROVIDER_SITE_OTHER)

## 2024-07-12 ENCOUNTER — Ambulatory Visit: Admitting: Certified Nurse Midwife

## 2024-07-12 ENCOUNTER — Other Ambulatory Visit (HOSPITAL_COMMUNITY)
Admission: RE | Admit: 2024-07-12 | Discharge: 2024-07-12 | Disposition: A | Discharge status: Home / Self Care | Source: Ambulatory Visit | Attending: Certified Nurse Midwife | Admitting: Certified Nurse Midwife

## 2024-07-12 VITALS — BP 146/85 | HR 100 | Wt 205.7 lb

## 2024-07-12 DIAGNOSIS — Z113 Encounter for screening for infections with a predominantly sexual mode of transmission: Secondary | ICD-10-CM | POA: Diagnosis not present

## 2024-07-12 DIAGNOSIS — O133 Gestational [pregnancy-induced] hypertension without significant proteinuria, third trimester: Secondary | ICD-10-CM

## 2024-07-12 DIAGNOSIS — Z3A35 35 weeks gestation of pregnancy: Secondary | ICD-10-CM

## 2024-07-12 DIAGNOSIS — Z3685 Encounter for antenatal screening for Streptococcus B: Secondary | ICD-10-CM

## 2024-07-12 DIAGNOSIS — Z3480 Encounter for supervision of other normal pregnancy, unspecified trimester: Secondary | ICD-10-CM

## 2024-07-12 NOTE — Assessment & Plan Note (Signed)
 BP mild range today, asymptomatic. BPP 8/8. Continue weekly visits with NST & labs. Pre-eclampsia precautions reinforced. IOL scheduled 8/12 5am at 38+1.

## 2024-07-12 NOTE — Patient Instructions (Signed)
 Signs and Symptoms of Labor Labor is the body's natural process of moving the baby and the placenta out of the uterus. The process of labor usually starts when the baby is full-term, between 74 and 41 weeks of pregnancy. Signs and symptoms that you are close to going into labor As your body prepares for labor and the birth of your baby, you may notice the following symptoms in the weeks and days before true labor starts: Passing a small amount of thick, bloody mucus from your vagina. This is called normal bloody show or losing your mucus plug. This may happen more than a week before labor begins, or right before labor begins, as the opening of the cervix starts to widen (dilate). For some women, the entire mucus plug passes at once. For others, pieces of the mucus plug may gradually pass over several days. Your baby moving (dropping) lower in your pelvis to get into position for birth (lightening). When this happens, you may feel more pressure on your bladder and pelvic bone and less pressure on your ribs. This may make it easier to breathe. It may also cause you to need to urinate more often and have problems with bowel movements. Having "practice contractions," also called Braxton Hicks contractions or false labor. These occur at irregular (unevenly spaced) intervals that are more than 10 minutes apart. False labor contractions are common after exercise or sexual activity. They will stop if you change position, rest, or drink fluids. These contractions are usually mild and do not get stronger over time. They may feel like: A backache or back pain. Mild cramps, similar to menstrual cramps. Tightening or pressure in your abdomen. Other early symptoms include: Nausea or loss of appetite. Diarrhea. Having a sudden burst of energy, or feeling very tired. Mood changes. Having trouble sleeping. Signs and symptoms that labor has begun Signs that you are in labor may include: Having contractions that come  at regular (evenly spaced) intervals and increase in intensity. This may feel like more intense tightening or pressure in your abdomen that moves to your back. Contractions may also feel like rhythmic pain in your upper thighs or back that comes and goes at regular intervals. If you are delivering for the first time, this change in intensity of contractions often occurs at a more gradual pace. If you have given birth before, you may notice a more rapid progression of contraction changes. Feeling pressure in the vaginal area. Your water breaking (rupture of membranes). This is when the sac of fluid that surrounds your baby breaks. Fluid leaking from your vagina may be clear or blood-tinged. Labor usually starts within 24 hours of your water breaking, but it may take longer to begin. Some people may feel a sudden gush of fluid; others may notice repeatedly damp underwear. Follow these instructions at home:  When labor starts, or if your water breaks, call your health care provider or nurse care line. Based on your situation, they will determine when you should go in for an exam. During early labor, you may be able to rest and manage symptoms at home. Some strategies to try at home include: Breathing and relaxation techniques. Taking a warm bath or shower. Listening to music. Using a heating pad on the lower back for pain. If directed, apply heat to the area as often as told by your health care provider. Use the heat source that your health care provider recommends, such as a moist heat pack or a heating pad. Place a  towel between your skin and the heat source. Leave the heat on for 20-30 minutes. Remove the heat if your skin turns bright red. This is especially important if you are unable to feel pain, heat, or cold. You have a greater risk of getting burned. Contact a health care provider if: Your labor has started. Your water breaks. You have nausea, vomiting, or diarrhea. Get help right away  if: You have painful, regular contractions that are 5 minutes apart or less. Labor starts before you are [redacted] weeks along in your pregnancy. You have a fever. You have bright red blood coming from your vagina. You do not feel your baby moving. You have a severe headache with or without vision problems. You have chest pain or shortness of breath. These symptoms may represent a serious problem that is an emergency. Do not wait to see if the symptoms will go away. Get medical help right away. Call your local emergency services (911 in the [place] Do not drive yourself to the hospital. Summary Labor is your body's natural process of moving your baby and the placenta out of your uterus. The process of labor usually starts when your baby is full-term, between 25 and 40 weeks of pregnancy. When labor starts, or if your water breaks, call your health care provider or nurse care line. Based on your situation, they will determine when you should go in for an exam. This information is not intended to replace advice given to you by your health care provider. Make sure you discuss any questions you have with your health care provider. Document Revised: 02/19/2021 Document Reviewed: 02/19/2021 Elsevier Patient Education  2024 [employer].

## 2024-07-12 NOTE — Assessment & Plan Note (Signed)
 Reviewed kick counts and preterm labor warning signs. Instructed to call office or come to hospital with persistent headache, vision changes, regular contractions, leaking of fluid, decreased fetal movement or vaginal bleeding. GBS/Gc/Ct collected today given plan for 38w IOL due to [facility].

## 2024-07-13 LAB — COMPREHENSIVE METABOLIC PANEL WITH GFR
ALT: 12 IU/L (ref 0–32)
AST: 14 IU/L (ref 0–40)
Albumin: 3.3 g/dL — ABNORMAL LOW (ref 4.0–5.0)
Alkaline Phosphatase: 136 IU/L — ABNORMAL HIGH (ref 41–116)
BUN/Creatinine Ratio: 10 (ref 9–23)
BUN: 5 mg/dL — ABNORMAL LOW (ref 6–20)
Bilirubin Total: <0.2 mg/dL (ref 0.0–1.2)
CO2: 17 mmol/L — ABNORMAL LOW (ref 20–29)
Calcium: 9.1 mg/dL (ref 8.7–10.2)
Chloride: 105 mmol/L (ref 96–106)
Creatinine, Ser: 0.49 mg/dL — ABNORMAL LOW (ref 0.57–1.00)
Globulin, Total: 2.3 g/dL (ref 1.5–4.5)
Glucose: 75 mg/dL (ref 70–99)
Potassium: 4.3 mmol/L (ref 3.5–5.2)
Sodium: 138 mmol/L (ref 134–144)
Total Protein: 5.6 g/dL — ABNORMAL LOW (ref 6.0–8.5)
eGFR: 134 mL/min/1.73 (ref >59)

## 2024-07-13 LAB — CERVICOVAGINAL ANCILLARY ONLY
Chlamydia: NEGATIVE
Comment: NEGATIVE
Comment: NORMAL
Neisseria Gonorrhea: NEGATIVE

## 2024-07-13 LAB — CBC
Hematocrit: 35.4 % (ref 34.0–46.6)
Hemoglobin: 11.5 g/dL (ref 11.1–15.9)
MCH: 29 pg (ref 26.6–33.0)
MCHC: 32.5 g/dL (ref 31.5–35.7)
MCV: 89 fL (ref 79–97)
Platelets: 216 x10E3/uL (ref 150–450)
RBC: 3.96 x10E6/uL (ref 3.77–5.28)
RDW: 13 % (ref 11.7–15.4)
WBC: 9 x10E3/uL (ref 3.4–10.8)

## 2024-07-13 LAB — PROTEIN / CREATININE RATIO, URINE
Creatinine, Urine: 45.9 mg/dL
Protein, Ur: 6.4 mg/dL
Protein/Creat Ratio: 139 mg/g{creat} (ref 0–200)

## 2024-07-14 ENCOUNTER — Ambulatory Visit: Payer: Self-pay | Admitting: Certified Nurse Midwife

## 2024-07-15 NOTE — Progress Notes (Signed)
    Return Prenatal Note   Subjective   28 y.o. G2P1001 at [redacted]w[redacted]d presents for this follow-up prenatal visit.  Patient denies headache, vision changes or RUQ pain. She reports good fetal movement.  Patient reports: Movement: Present Contractions: Not present  Objective   Flow sheet Vitals: Pulse Rate: 98 BP: (!) 147/88 Total weight gain: 24 lb 12.8 oz (11.2 kg)  General Appearance  No acute distress, well appearing, and well nourished Pulmonary   Normal work of breathing Neurologic   Alert and oriented to person, place, and time Psychiatric   Mood and affect within normal limits   Assessment/Plan   Plan  28 y.o. G2P1001 at [redacted]w[redacted]d presents for follow-up OB visit. Reviewed prenatal record including previous visit note.  Gestational hypertension BP mild range today, asymptomatic. US  on 9/23 BPP 8/8. EFW 68%le, AC 80%ile, AFI 18  Continue weekly visits with NST & labs. Pre-eclampsia precautions reviewed. IOL already scheduled 8/12. Labs collected.   Supervision of other normal pregnancy, antepartum Reviewed labor warning signs and expectations for birth. Instructed to call office or come to hospital with persistent headache, vision changes, regular contractions, leaking of fluid, decreased fetal movement or vaginal bleeding.       Orders Placed This Encounter  Procedures   Comprehensive metabolic panel with GFR   CBC   Protein / creatinine ratio, urine   No follow-ups on file.   Future Appointments  Date Time Provider Department Center  07/26/2024  9:15 AM [facility]OB[facility]ST ROOM [facility] None  07/26/2024  9:35 AM Sebastian Sham, CNM [facility] None  08/02/2024  9:15 AM [facility]OB[facility]ST ROOM [facility] None  08/02/2024  9:55 AM Neldon Shepard, Damien, CNM [facility] None    For next visit:  ROB with NST     Damien Parsley, CNM [facility] 07/19/2509:10 AM

## 2024-07-15 NOTE — Patient Instructions (Signed)
 Third Trimester of Pregnancy  The third trimester of pregnancy is from week 28 through week 40. This is months 7 through 9. The third trimester is a time when your baby is growing fast. Body changes during your third trimester Your body continues to change during this time. The changes usually go away after your baby is born. Physical changes You will continue to gain weight. You may get stretch marks on your hips, belly, and breasts. Your breasts will keep growing and may hurt. A yellow fluid (colostrum) may leak from your breasts. This is the first milk you're making for your baby. Your hair may grow faster and get thicker. In some cases, you may get hair loss. Your belly button may stick out. You may have more swelling in your hands, face, or ankles. Health changes You may have heartburn. You may feel short of breath. This is caused by the uterus that is now bigger. You may have more aches in the pelvis, back, or thighs. You may have more tingling or numbness in your hands, arms, and legs. You may pee more often. You may have trouble pooping (constipation) or swollen veins in the butt that can itch or get painful (hemorrhoids). Other changes You may have more problems sleeping. You may notice the baby moving lower in your belly (dropping). You may have more fluid coming from your vagina. Your joints may feel loose, and you may have pain around your pelvic bone. Follow these instructions at home: Medicines Take medicines only as told by your health care provider. Some medicines are not safe during pregnancy. Your provider may change the medicines that you take. Do not take any medicines unless told to by your provider. Take a prenatal vitamin that has at least 600 micrograms (mcg) of folic acid. Do not use herbal medicines, illegal drugs, or medicines that are not approved by your provider. Eating and drinking While you're pregnant your body needs additional nutrition to help  support your growing baby. Talk with your provider about your nutritional needs. Activity Most women are able to exercise regularly during pregnancy. Exercise routines may need to change at the end of your pregnancy. Talk to your provider about your activities and exercise routine. Relieving pain and discomfort Rest often with your legs raised if you have leg cramps or low back pain. Take warm sitz baths to soothe pain from hemorrhoids. Use hemorrhoid cream if your provider says it's okay. Wear a good, supportive bra if your breasts hurt. Do not use hot tubs, steam rooms, or saunas. Do not douche. Do not use tampons or scented pads. Safety Talk to your provider before traveling far distances. Wear your seatbelt at all times when you're in a car. Talk to your provider if someone hits you, hurts you, or yells at you. Preparing for birth To prepare for your baby: Take childbirth and breastfeeding classes. Visit the hospital and tour the maternity area. Buy a rear-facing car seat. Learn how to install it in your car. General instructions Avoid cat litter boxes and soil used by cats. These things carry germs that can cause harm to your pregnancy and your baby. Do not drink alcohol, smoke, vape, or use products with nicotine  or tobacco in them. If you need help quitting, talk with your provider. Keep all follow-up visits for your third trimester. Your provider will do more exams and tests during this trimester. Write down your questions. Take them to your prenatal visits. Your provider also will: Talk with you about  your overall health. Give you advice or refer you to specialists who can help with different needs, including: Mental health and counseling. Foods and healthy eating. Ask for help if you need help with food. Where to find more information American Pregnancy Association: americanpregnancy.org [employer]ologists: acog.org Office on [employer] Health:  [employer] Contact a health care provider if: You have a headache that does not go away when you take medicine. You have any of these problems: You can't eat or drink. You have nausea and vomiting. You have watery poop (diarrhea) for 2 days or more. You have pain when you pee, or your pee smells bad. You have been sick for 2 days or more and aren't getting better. Contact your provider right away if: You have any of these coming from your vagina: Abnormal discharge. Bad-smelling fluid. Bleeding. Your baby is moving less than usual. You have signs of labor: You have any contractions, belly cramping, or have pain in your pelvis or lower back before 37 weeks of pregnancy (preterm labor). You have regular contractions that are less than 5 minutes apart. Your water breaks. You have symptoms of high blood pressure or preeclampsia. These include: A severe, throbbing headache that does not go away. Sudden or extreme swelling of your face, hands, legs, or feet. Vision problems: You see spots. You have blurry vision. Your eyes are sensitive to light. If you can't reach your provider, go to an urgent care or emergency room. Get help right away if: You faint, become confused, or can't think clearly. You have chest pain or trouble breathing. You have any kind of injury, such as from a fall or a car crash. These symptoms may be an emergency. Call 911 right away. Do not wait to see if the symptoms will go away. Do not drive yourself to the hospital. This information is not intended to replace advice given to you by your health care provider. Make sure you discuss any questions you have with your health care provider. Document Revised: 07/09/2023 Document Reviewed: 02/06/2023 Elsevier Patient Education  2024 Elsevier Inc.Nonstress Test: What to Expect A nonstress test, also called an NST, is done during pregnancy to check your baby's heartbeat. The procedure can help to show if your baby  is healthy. It may be done if: Your due date has passed. Your pregnancy is high risk. Your baby is moving less than normal. You've lost a previous pregnancy. Your baby is growing slowly. There's too much or too little fluid around your baby. The NST may be done in the third trimester to find out if it's best for your baby to be born early. During an NST, your baby's heartbeat is watched for at least 20 minutes. If the baby is healthy, the heart rate will go up when the baby moves and will return to normal when the baby rests. This should happen at least twice during the test. Tell a health care provider about: Any allergies you have. Any medical problems you have. All medicines you take. These include vitamins, herbs, eye drops, and creams. Any surgeries you've had. Any past pregnancies you've had. What are the risks? There are no risks to you or your baby from a nonstress test. This procedure shouldn't be painful or uncomfortable. What happens before? Eat a meal right before the test or as told by your health care team. Food may help the baby to move. Use the restroom right before the test. What happens during a nonstress test?  Two monitors will be placed on your belly. One will check your baby's heart rate, and the other will check for contractions. You may be asked to lie down on your side or to sit up. You may be given a button to press when you feel your baby move. If your baby seems to be sleeping, you may be asked to drink some juice or soda, eat a snack, or change positions. These steps may vary. Ask what you can expect. What can I expect after? Your team will talk with you about the results and tell you the next steps. If your team gave you any diet or activity instructions, make sure to follow them. Keep all follow-up visits. This is important to check on your health and the health of your baby. This information is not intended to replace advice given to you by your health  care provider. Make sure you discuss any questions you have with your health care provider. Document Revised: 10/01/2023 Document Reviewed: 10/01/2023 Elsevier Patient Education  2025 Elsevier Inc.Group B Streptococcus Infection During Pregnancy Group B Streptococcus (GBS) is a type of bacteria that is often found in healthy people. It is commonly found in the rectum, vagina, and intestines. In people who are healthy and not pregnant, the bacteria rarely cause serious illness or complications. However, women who test positive for GBS during pregnancy can pass the bacteria to the baby during childbirth. This can cause serious infection in the baby after birth. Women with GBS may also have infections during their pregnancy or soon after childbirth. The infections include urinary tract infections (UTIs) or infections of the uterus. GBS also increases a woman's risk of complications during pregnancy, such as early labor or delivery, miscarriage, or stillbirth. Routine testing for GBS is recommended for all pregnant women. What are the causes? This condition is caused by bacteria called Streptococcus agalactiae. What increases the risk? You may have a higher risk for GBS infection during pregnancy if you had one during a past pregnancy. What are the signs or symptoms? In most cases, GBS infection does not cause symptoms in pregnant women. If symptoms exist, they may include: Labor that starts before the 37th week of pregnancy. A UTI or bladder infection. This may cause a fever, frequent urination, or pain and burning during urination. Fever during labor. There can also be a rapid heartbeat in the mother or baby. Rare but serious symptoms of a GBS infection in women include: Blood infection (septicemia). This may cause fever, chills, or confusion. Lung infection (pneumonia). This may cause fever, chills, cough, rapid breathing, chest pain, or difficulty breathing. Bone, joint, skin, or soft tissue  infection. How is this diagnosed? You may be screened for GBS between week 35 and week 37 of pregnancy. If you have symptoms of preterm labor, you may be screened earlier. This condition is diagnosed based on lab test results from: A swab of fluid from the vagina and rectum. A urine sample. How is this treated? This condition is treated with antibiotic medicine. Antibiotic medicine may be given: To you when you go into labor, or as soon as your water breaks. The medicines will continue until after you give birth. If you are having a cesarean delivery, you do not need antibiotics unless your water has broken. To your baby, if he or she requires treatment. Your health care provider will check your baby to decide if he or she needs antibiotics to prevent a serious infection. Follow these instructions at home: Take over-the-counter  and prescription medicines only as told by your health care provider. Take your antibiotic medicine as told by your health care provider. Do not stop taking the antibiotic even if you start to feel better. Keep all pre-birth (prenatal) visits and follow-up visits as told by your health care provider. This is important. Contact a health care provider if: You have pain or burning when you urinate. You have to urinate more often than usual. You have a fever or chills. You develop a bad-smelling vaginal discharge. Get help right away if: Your water breaks. You go into labor. You have severe pain in your abdomen. You have difficulty breathing. You have chest pain. These symptoms may represent a serious problem that is an emergency. Do not wait to see if the symptoms will go away. Get medical help right away. Call your local emergency services (911 in the [place] Do not drive yourself to the hospital. Summary GBS is a type of bacteria that is common in healthy people. During pregnancy, colonization with GBS can cause serious complications for you or your baby. Your health  care provider will screen you between 35 and 37 weeks of pregnancy to determine if you are colonized with GBS. If you are colonized with GBS during pregnancy, your health care provider will recommend antibiotics through an IV during labor. After delivery, your baby will be evaluated for complications related to potential GBS infection and may require antibiotics to prevent a serious infection. This information is not intended to replace advice given to you by your health care provider. Make sure you discuss any questions you have with your health care provider. Document Revised: 09/22/2022 Document Reviewed: 09/22/2022 Elsevier Patient Education  2024 [employer].

## 2024-07-16 LAB — CULTURE, BETA STREP (GROUP B ONLY): Strep Gp B Culture: NEGATIVE

## 2024-07-19 ENCOUNTER — Other Ambulatory Visit

## 2024-07-19 ENCOUNTER — Ambulatory Visit: Admitting: Certified Nurse Midwife

## 2024-07-19 VITALS — BP 147/88 | HR 98 | Wt 209.8 lb

## 2024-07-19 DIAGNOSIS — Z3480 Encounter for supervision of other normal pregnancy, unspecified trimester: Secondary | ICD-10-CM | POA: Diagnosis not present

## 2024-07-19 DIAGNOSIS — Z3A36 36 weeks gestation of pregnancy: Secondary | ICD-10-CM | POA: Diagnosis not present

## 2024-07-19 DIAGNOSIS — Z3A35 35 weeks gestation of pregnancy: Secondary | ICD-10-CM

## 2024-07-19 DIAGNOSIS — Z113 Encounter for screening for infections with a predominantly sexual mode of transmission: Secondary | ICD-10-CM

## 2024-07-19 DIAGNOSIS — Z3689 Encounter for other specified antenatal screening: Secondary | ICD-10-CM

## 2024-07-19 DIAGNOSIS — Z3685 Encounter for antenatal screening for Streptococcus B: Secondary | ICD-10-CM

## 2024-07-19 DIAGNOSIS — O133 Gestational [pregnancy-induced] hypertension without significant proteinuria, third trimester: Secondary | ICD-10-CM | POA: Diagnosis not present

## 2024-07-19 NOTE — Addendum Note (Signed)
 Addended by: TAMEA ANNALEE DEL on: 07/19/2024 01:24 PM   Modules accepted: Orders

## 2024-07-19 NOTE — Assessment & Plan Note (Addendum)
 BP mild range today, asymptomatic. US  on 9/23 BPP 8/8. EFW 68%le, AC 80%ile, AFI 18  Continue weekly visits with NST & labs. Pre-eclampsia precautions reviewed. IOL already scheduled 8/12. Labs collected.

## 2024-07-19 NOTE — Assessment & Plan Note (Signed)
 Reviewed labor warning signs and expectations for birth. Instructed to call office or come to hospital with persistent headache, vision changes, regular contractions, leaking of fluid, decreased fetal movement or vaginal bleeding.

## 2024-07-20 ENCOUNTER — Ambulatory Visit: Payer: Self-pay | Admitting: Certified Nurse Midwife

## 2024-07-20 LAB — COMPREHENSIVE METABOLIC PANEL WITH GFR
ALT: 11 IU/L (ref 0–32)
AST: 16 IU/L (ref 0–40)
Albumin: 3.2 g/dL — ABNORMAL LOW (ref 4.0–5.0)
Alkaline Phosphatase: 122 IU/L — ABNORMAL HIGH (ref 41–116)
BUN/Creatinine Ratio: 11 (ref 9–23)
BUN: 5 mg/dL — ABNORMAL LOW (ref 6–20)
Bilirubin Total: <0.2 mg/dL (ref 0.0–1.2)
CO2: 16 mmol/L — ABNORMAL LOW (ref 20–29)
Calcium: 8.8 mg/dL (ref 8.7–10.2)
Chloride: 105 mmol/L (ref 96–106)
Creatinine, Ser: 0.46 mg/dL — ABNORMAL LOW (ref 0.57–1.00)
Globulin, Total: 1.7 g/dL (ref 1.5–4.5)
Glucose: 84 mg/dL (ref 70–99)
Potassium: 4 mmol/L (ref 3.5–5.2)
Sodium: 135 mmol/L (ref 134–144)
Total Protein: 4.9 g/dL — ABNORMAL LOW (ref 6.0–8.5)
eGFR: 136 mL/min/1.73 (ref >59)

## 2024-07-20 LAB — PROTEIN / CREATININE RATIO, URINE
Creatinine, Urine: 136.6 mg/dL
Protein, Ur: 18.1 mg/dL
Protein/Creat Ratio: 133 mg/g{creat} (ref 0–200)

## 2024-07-20 LAB — CBC
Hematocrit: 35 % (ref 34.0–46.6)
Hemoglobin: 11.1 g/dL (ref 11.1–15.9)
MCH: 28.5 pg (ref 26.6–33.0)
MCHC: 31.7 g/dL (ref 31.5–35.7)
MCV: 90 fL (ref 79–97)
Platelets: 200 x10E3/uL (ref 150–450)
RBC: 3.89 x10E6/uL (ref 3.77–5.28)
RDW: 13.2 % (ref 11.7–15.4)
WBC: 10.7 x10E3/uL (ref 3.4–10.8)

## 2024-07-25 ENCOUNTER — Telehealth: Admitting: Physician Assistant

## 2024-07-25 DIAGNOSIS — J0140 Acute pansinusitis, unspecified: Secondary | ICD-10-CM

## 2024-07-25 MED ORDER — FLUTICASONE PROPIONATE 50 MCG/ACT NA SUSP: 1.0000 | Freq: Every day | NASAL | 6 refills | Status: DC

## 2024-07-25 NOTE — Progress Notes (Signed)
 We are sorry that you are not feeling well.  Here is how we plan to help!  Based on what you have shared with me it looks like you have sinusitis.  Sinusitis is inflammation and infection in the sinus cavities of the head.  Based on your presentation I believe you most likely have Acute Viral Sinusitis.  This is an infection most likely caused by a virus. There is not specific treatment for viral sinusitis other than to help you with the symptoms until the infection runs its course.   Saline nasal spray help and can safely be used as often as needed for congestion, I have prescribed: Fluticasone  nasal spray two sprays in each nostril once a day.  Given your allergies and current pregnancy status, treatment options are limited.  Over the counter anti-histamines, such as Claritin, benadryl  zyrtec  are generally safe to take during pregnancy and will help with runny nose, itchy eyes.   Some authorities believe that zinc sprays or the use of Echinacea may shorten the course of your symptoms.  Sinus infections are not as easily transmitted as other respiratory infection, however we still recommend that you avoid close contact with loved ones, especially the very young and elderly.  Remember to wash your hands thoroughly throughout the day as this is the number one way to prevent the spread of infection!  Home Care: Only take medications as instructed by your medical team. Do not take these medications with alcohol. A steam or ultrasonic humidifier can help congestion.  You can place a towel over your head and breathe in the steam from hot water coming from a faucet. Avoid close contacts especially the very young and the elderly. Cover your mouth when you cough or sneeze. Always remember to wash your hands.  Get Help Right Away If: You develop worsening fever or sinus pain. You develop a severe head ache or visual changes. Your symptoms persist after you have completed your treatment  plan.  Make sure you Understand these instructions. Will watch your condition. Will get help right away if you are not doing well or get worse.   Your e-visit answers were reviewed by a board certified advanced clinical practitioner to complete your personal care plan.  Depending on the condition, your plan could have included both over the counter or prescription medications.  If there is a problem please reply  once you have received a response from your provider.  Your safety is important to us .  If you have drug allergies check your prescription carefully.    You can use MyChart to ask questions about today's visit, request a non-urgent call back, or ask for a work or school excuse for 24 hours related to this e-Visit. If it has been greater than 24 hours you will need to follow up with your provider, or enter a new e-Visit to address those concerns.  You will get an e-mail in the next two days asking about your experience.  I hope that your e-visit has been valuable and will speed your recovery. Thank you for using e-visits.  I have spent 5 minutes in review of e-visit questionnaire, review and updating patient chart, medical decision making and response to patient.   Lillian Tigges, PA-C

## 2024-07-26 ENCOUNTER — Other Ambulatory Visit

## 2024-07-26 ENCOUNTER — Ambulatory Visit (INDEPENDENT_AMBULATORY_CARE_PROVIDER_SITE_OTHER): Admitting: Certified Nurse Midwife

## 2024-07-26 ENCOUNTER — Telehealth: Admitting: Physician Assistant

## 2024-07-26 VITALS — BP 130/84 | HR 88 | Wt 205.3 lb

## 2024-07-26 DIAGNOSIS — Z3A37 37 weeks gestation of pregnancy: Secondary | ICD-10-CM | POA: Diagnosis not present

## 2024-07-26 DIAGNOSIS — O133 Gestational [pregnancy-induced] hypertension without significant proteinuria, third trimester: Secondary | ICD-10-CM

## 2024-07-26 DIAGNOSIS — B9689 Other specified bacterial agents as the cause of diseases classified elsewhere: Secondary | ICD-10-CM

## 2024-07-26 LAB — POCT URINALYSIS DIPSTICK OB
Bilirubin, UA: NEGATIVE
Blood, UA: NEGATIVE
Glucose, UA: NEGATIVE
Ketones, UA: NEGATIVE
Nitrite, UA: NEGATIVE
Spec Grav, UA: 1.02 (ref 1.010–1.025)
Urobilinogen, UA: 0.2 U/dL
pH, UA: 6 (ref 5.0–8.0)

## 2024-07-26 MED ORDER — AMOXICILLIN 875 MG PO TABS: 875.0000 mg | ORAL_TABLET | Freq: Two times a day (BID) | ORAL | 0 refills | Status: DC

## 2024-07-26 NOTE — Patient Instructions (Signed)
 Nonstress Test: What to Expect A nonstress test, also called an NST, is done during pregnancy to check your baby's heartbeat. The procedure can help to show if your baby is healthy. It may be done if: Your due date has passed. Your pregnancy is high risk. Your baby is moving less than normal. You've lost a previous pregnancy. Your baby is growing slowly. There's too much or too little fluid around your baby. The NST may be done in the third trimester to find out if it's best for your baby to be born early. During an NST, your baby's heartbeat is watched for at least 20 minutes. If the baby is healthy, the heart rate will go up when the baby moves and will return to normal when the baby rests. This should happen at least twice during the test. Tell a health care provider about: Any allergies you have. Any medical problems you have. All medicines you take. These include vitamins, herbs, eye drops, and creams. Any surgeries you've had. Any past pregnancies you've had. What are the risks? There are no risks to you or your baby from a nonstress test. This procedure shouldn't be painful or uncomfortable. What happens before? Eat a meal right before the test or as told by your health care team. Food may help the baby to move. Use the restroom right before the test. What happens during a nonstress test?  Two monitors will be placed on your belly. One will check your baby's heart rate, and the other will check for contractions. You may be asked to lie down on your side or to sit up. You may be given a button to press when you feel your baby move. If your baby seems to be sleeping, you may be asked to drink some juice or soda, eat a snack, or change positions. These steps may vary. Ask what you can expect. What can I expect after? Your team will talk with you about the results and tell you the next steps. If your team gave you any diet or activity instructions, make sure to follow them. Keep all  follow-up visits. This is important to check on your health and the health of your baby. This information is not intended to replace advice given to you by your health care provider. Make sure you discuss any questions you have with your health care provider. Document Revised: 10/01/2023 Document Reviewed: 10/01/2023 Elsevier Patient Education  2025 [employer].

## 2024-07-26 NOTE — Progress Notes (Signed)
    Return Prenatal Note   Subjective   28 y.o. G2P1001 at 101w3d presents for this follow-up prenatal visit.  Patient doing well. Feeling lots of pressure. Feeling good fetal movement. Has induction scheduled for Sunday  Patient reports: increased mucous like discharge and bloody show.  Movement: Present Contractions: Irritability  Objective   Flow sheet Vitals: Pulse Rate: (!) 101 BP: (!) 140/90 Repeat 130/80 Total weight gain: 20 lb 4.8 oz (9.208 kg)  General Appearance  No acute distress, well appearing, and well nourished Pulmonary   Normal work of breathing Neurologic   Alert and oriented to person, place, and time Psychiatric   Mood and affect within normal limits  Fetus A Non-Stress Test Interpretation for 07/26/24  Indication: GHTN   Fetal heart rate130 Accelerations present Decelerations absent Moderate variability  Ctx absent   Susan Klein 12-17-1995 [redacted]w[redacted]d   Plan  28 y.o. G2P1001 at [redacted]w[redacted]d presents for follow-up OB visit. Reviewed prenatal record including previous visit note.  No problem-specific Assessment & Plan notes found for this encounter.      Orders Placed This Encounter  Procedures   POC Urinalysis Dipstick OB   Fetal nonstress test    Standing Status:   Future    Expiration Date:   07/26/2025   No follow-ups on file.   No future appointments.   For next visit:  Sunday for Induction at [facility], CNM

## 2024-07-26 NOTE — Progress Notes (Signed)
 E-Visit for Sinus Problems  We are sorry that you are not feeling well.  Here is how we plan to help!  Based on what you have shared with me it looks like you have sinusitis.  Sinusitis is inflammation and infection in the sinus cavities of the head.  Based on your presentation I believe you most likely have Acute Bacterial Sinusitis.  This is an infection caused by bacteria and is treated with antibiotics. I have prescribed Amoxicillin  875 mg twice daily for 7 days.  You may use plain Mucinex. Talk to your pharmacist about acceptable OTC cough medication during pregnancy. Saline nasal spray help and can safely be used as often as needed for congestion.  If you develop worsening sinus pain, fever or notice severe headache and vision changes, or if symptoms are not better after completion of antibiotic, please schedule an appointment with a health care provider.    Sinus infections are not as easily transmitted as other respiratory infection, however we still recommend that you avoid close contact with loved ones, especially the very young and elderly.  Remember to wash your hands thoroughly throughout the day as this is the number one way to prevent the spread of infection!  Home Care: Only take medications as instructed by your medical team. Complete the entire course of an antibiotic. Do not take these medications with alcohol. A steam or ultrasonic humidifier can help congestion.  You can place a towel over your head and breathe in the steam from hot water coming from a faucet. Avoid close contacts especially the very young and the elderly. Cover your mouth when you cough or sneeze. Always remember to wash your hands.  Get Help Right Away If: You develop worsening fever or sinus pain. You develop a severe head ache or visual changes. Your symptoms persist after you have completed your treatment plan.  Make sure you Understand these instructions. Will watch your condition. Will get  help right away if you are not doing well or get worse.  Your e-visit answers were reviewed by a board certified advanced clinical practitioner to complete your personal care plan.  Depending on the condition, your plan could have included both over the counter or prescription medications.  If there is a problem please reply  once you have received a response from your provider.  Your safety is important to us .  If you have drug allergies check your prescription carefully.    You can use MyChart to ask questions about today's visit, request a non-urgent call back, or ask for a work or school excuse for 24 hours related to this e-Visit. If it has been greater than 24 hours you will need to follow up with your provider, or enter a new e-Visit to address those concerns.  You will get an e-mail in the next two days asking about your experience.  I hope that your e-visit has been valuable and will speed your recovery. Thank you for using e-visits.  I have spent 5 minutes in review of e-visit questionnaire, review and updating patient chart, medical decision making and response to patient.   Elsie Velma Lunger, PA-C

## 2024-07-27 DIAGNOSIS — Z3482 Encounter for supervision of other normal pregnancy, second trimester: Secondary | ICD-10-CM | POA: Diagnosis not present

## 2024-07-27 DIAGNOSIS — Z3483 Encounter for supervision of other normal pregnancy, third trimester: Secondary | ICD-10-CM | POA: Diagnosis not present

## 2024-07-28 ENCOUNTER — Other Ambulatory Visit: Payer: Self-pay

## 2024-07-28 ENCOUNTER — Observation Stay
Admission: EM | Admit: 2024-07-28 | Discharge: 2024-07-28 | Disposition: A | Discharge status: Home / Self Care | Attending: Certified Nurse Midwife | Admitting: Certified Nurse Midwife

## 2024-07-28 ENCOUNTER — Encounter: Payer: Self-pay | Admitting: Obstetrics

## 2024-07-28 DIAGNOSIS — Z3A37 37 weeks gestation of pregnancy: Secondary | ICD-10-CM | POA: Diagnosis not present

## 2024-07-28 DIAGNOSIS — O26893 Other specified pregnancy related conditions, third trimester: Secondary | ICD-10-CM

## 2024-07-28 DIAGNOSIS — R109 Unspecified abdominal pain: Secondary | ICD-10-CM | POA: Diagnosis not present

## 2024-07-28 DIAGNOSIS — O4703 False labor before 37 completed weeks of gestation, third trimester: Secondary | ICD-10-CM | POA: Diagnosis not present

## 2024-07-28 DIAGNOSIS — Z3480 Encounter for supervision of other normal pregnancy, unspecified trimester: Secondary | ICD-10-CM

## 2024-07-28 DIAGNOSIS — O139 Gestational [pregnancy-induced] hypertension without significant proteinuria, unspecified trimester: Principal | ICD-10-CM

## 2024-07-28 NOTE — OB Triage Provider Note (Signed)
      L&D OB Triage Note  SUBJECTIVE Susan Klein is a 28 y.o. G49P1001 female at [redacted]w[redacted]d, EDD Estimated Date of Delivery: 08/13/24 who presented to triage with complaints of contractions and some blood in her discharge. She denies loss of fluid and active vaginal bleeding. She notes she see blood when she wipes in the mucus /discharge. She is feeling good fetal movement    OB History  Gravida Para Term Preterm AB Living  2 1 1  0 0 1  SAB IAB Ectopic Multiple Live Births  0 0 0 0 1    # Outcome Date GA Lbr Len/2nd Weight Sex Type Anes PTL Lv  2 Current           1 Term 10/22/17 [redacted]w[redacted]d 07:04 / 01:13 3240 g F Vag-Spont EPI  LIV     Name: Susan Klein     Apgar1: 8  Apgar5: 9    Medications Prior to Admission  Medication Sig Dispense Refill Last Dose/Taking   amoxicillin  (AMOXIL ) 875 MG tablet Take 1 tablet (875 mg total) by mouth 2 (two) times daily. 14 tablet 0 07/28/2024   aspirin  81 MG chewable tablet Chew 81 mg by mouth daily.   07/28/2024   Prenatal Vit-Fe Fumarate-FA (PRENATAL VITAMIN) 27-0.8 MG TABS Take 1 tablet by mouth daily. 90 tablet 3 07/28/2024   fluticasone  (FLONASE ) 50 MCG/ACT nasal spray Place 1 spray into both nostrils daily. (Patient not taking: Reported on 07/28/2024) 16 g 6 Not Taking     OBJECTIVE  Nursing Evaluation:   BP (!) 148/77 (BP Location: Right Arm)   Pulse 97   Temp 98.4 F (36.9 C) (Oral)   Resp 18   Ht 5' 3 (1.6 m)   Wt 93.1 kg   LMP 11/07/2023 (Exact Date)   SpO2 98%   BMI 36.37 kg/m    Findings:            NST was performed and has been reviewed by me.  NST INTERPRETATION: Category I  Mode: External (EFM d/c'd per verbal order from CNM, tracing reactive) Baseline Rate (A): 130 bpm Variability: Moderate Accelerations: 15 x 15 Decelerations: None     Contraction Frequency (min): 4-7  ASSESSMENT Impression:  1.  Pregnancy:  G2P1001 at [redacted]w[redacted]d , EDD Estimated Date of Delivery: 08/13/24 2.  Reassuring fetal and maternal status 3.  No  cervical change  PLAN 1. Current condition and above findings reviewed.  Reassuring fetal and maternal condition. 2. Discharge home with standard labor precautions given to return to L&D or call the office for problems. 3. Continue routine prenatal care.  Susan Klein, CNM

## 2024-07-28 NOTE — Discharge Instructions (Signed)
LABOR: When contractions begin, you should start to time them from the beginning of one contraction to the beginning of the next.  When contractions are 5-10 minutes apart or less and have been regular for at least an hour, you should call your health care provider. ° °Notify your doctor if any of the following occur: °1. Bleeding from the vagina 7. Sudden, constant, or occasional abdominal pain  °2. Pain or burning when urinating 8. Sudden gushing of fluid from the vagina (with or without continued leaking)  °3. Chills or fever 9. Fainting spells, "black outs" or loss of consciousness  °4. Increase in vaginal discharge 10. Severe or continued nausea or vomiting  °5. Pelvic pressure (sudden increase) 11. Blurring of vision or spots before the eyes  °6. Baby moving less than usual 12. Leaking of fluid  ° ° °FETAL KICK COUNT: °Lie on your left side for one hour after a meal, and count the number of times your baby kicks. If it is less than 5 times, get up, move around and drink some juice. Repeat the test 30 minutes later. If it is still less than 5 kicks in an hour, notify your doctor. °

## 2024-07-28 NOTE — OB Triage Note (Signed)
 Patient arrived in triage with c/o bleeding noted in discharge throughout the day as well as cramping. Patient reports noting blood in discharge on Monday, had OB visit on Tuesday with cervical exam. Reports fetal movement noted, but not as much as usual. No leaking of fluid. Cramping felt in lower abdomen, intermittent, rates 3/10. EFM applied and assessing. Initial FHT 158. Patient reports recently being treated for sinus infection. GHTN noted this pregnancy. Initial BP 148/77 and patient reports this is normal for her. Plan to monitor EFM and notify CNM of patient's arrival.

## 2024-07-29 ENCOUNTER — Telehealth: Payer: Self-pay

## 2024-07-29 NOTE — Telephone Encounter (Signed)
 Patient evaluated on L & D.

## 2024-07-31 ENCOUNTER — Inpatient Hospital Stay: Admitting: General Practice

## 2024-07-31 ENCOUNTER — Inpatient Hospital Stay
Admission: EM | Admit: 2024-07-31 | Discharge: 2024-08-02 | DRG: 807 | Disposition: A | Discharge status: Home / Self Care | Attending: Family | Admitting: Family

## 2024-07-31 ENCOUNTER — Other Ambulatory Visit: Payer: Self-pay

## 2024-07-31 ENCOUNTER — Encounter: Payer: Self-pay | Admitting: Certified Nurse Midwife

## 2024-07-31 DIAGNOSIS — O134 Gestational [pregnancy-induced] hypertension without significant proteinuria, complicating childbirth: Secondary | ICD-10-CM | POA: Diagnosis not present

## 2024-07-31 DIAGNOSIS — Z3A38 38 weeks gestation of pregnancy: Secondary | ICD-10-CM | POA: Diagnosis not present

## 2024-07-31 DIAGNOSIS — O9962 Diseases of the digestive system complicating childbirth: Secondary | ICD-10-CM | POA: Diagnosis present

## 2024-07-31 DIAGNOSIS — E66811 Obesity, class 1: Secondary | ICD-10-CM | POA: Diagnosis present

## 2024-07-31 DIAGNOSIS — O139 Gestational [pregnancy-induced] hypertension without significant proteinuria, unspecified trimester: Principal | ICD-10-CM | POA: Diagnosis present

## 2024-07-31 DIAGNOSIS — K219 Gastro-esophageal reflux disease without esophagitis: Secondary | ICD-10-CM | POA: Diagnosis present

## 2024-07-31 DIAGNOSIS — Z419 Encounter for procedure for purposes other than remedying health state, unspecified: Secondary | ICD-10-CM | POA: Diagnosis not present

## 2024-07-31 DIAGNOSIS — O9902 Anemia complicating childbirth: Secondary | ICD-10-CM | POA: Diagnosis not present

## 2024-07-31 DIAGNOSIS — O99214 Obesity complicating childbirth: Secondary | ICD-10-CM | POA: Diagnosis not present

## 2024-07-31 DIAGNOSIS — O133 Gestational [pregnancy-induced] hypertension without significant proteinuria, third trimester: Secondary | ICD-10-CM

## 2024-07-31 DIAGNOSIS — O26893 Other specified pregnancy related conditions, third trimester: Secondary | ICD-10-CM | POA: Diagnosis not present

## 2024-07-31 DIAGNOSIS — E669 Obesity, unspecified: Secondary | ICD-10-CM

## 2024-07-31 LAB — URINALYSIS, ROUTINE W REFLEX MICROSCOPIC
Bacteria, UA: NONE SEEN
Bilirubin Urine: NEGATIVE
Glucose, UA: NEGATIVE mg/dL
Ketones, ur: NEGATIVE mg/dL
Nitrite: NEGATIVE
Protein, ur: NEGATIVE mg/dL
Specific Gravity, Urine: 1.017 (ref 1.005–1.030)
WBC, UA: >50 WBC/hpf (ref 0–5)
pH: 6 (ref 5.0–8.0)

## 2024-07-31 LAB — TYPE AND SCREEN
ABO/RH(D): O POS
Antibody Screen: NEGATIVE

## 2024-07-31 LAB — COMPREHENSIVE METABOLIC PANEL WITH GFR
ALT: 14 U/L (ref 0–44)
AST: 25 U/L (ref 15–41)
Albumin: 2.6 g/dL — ABNORMAL LOW (ref 3.5–5.0)
Alkaline Phosphatase: 115 U/L (ref 38–126)
Anion gap: 11 (ref 5–15)
BUN: 8 mg/dL (ref 6–20)
CO2: 20 mmol/L — ABNORMAL LOW (ref 22–32)
Calcium: 8.8 mg/dL — ABNORMAL LOW (ref 8.9–10.3)
Chloride: 107 mmol/L (ref 98–111)
Creatinine, Ser: 0.6 mg/dL (ref 0.44–1.00)
GFR, Estimated: >60 mL/min (ref >60)
Glucose, Bld: 95 mg/dL (ref 70–99)
Potassium: 3.2 mmol/L — ABNORMAL LOW (ref 3.5–5.1)
Sodium: 138 mmol/L (ref 135–145)
Total Bilirubin: 0.4 mg/dL (ref 0.0–1.2)
Total Protein: 6 g/dL — ABNORMAL LOW (ref 6.5–8.1)

## 2024-07-31 LAB — CBC
HCT: 33.7 % — ABNORMAL LOW (ref 36.0–46.0)
HCT: 30.3 % — ABNORMAL LOW (ref 36.0–46.0)
Hemoglobin: 11.1 g/dL — ABNORMAL LOW (ref 12.0–15.0)
Hemoglobin: 10.1 g/dL — ABNORMAL LOW (ref 12.0–15.0)
MCH: 29.1 pg (ref 26.0–34.0)
MCH: 29.2 pg (ref 26.0–34.0)
MCHC: 32.9 g/dL (ref 30.0–36.0)
MCHC: 33.3 g/dL (ref 30.0–36.0)
MCV: 88.5 fL (ref 80.0–100.0)
MCV: 87.6 fL (ref 80.0–100.0)
Platelets: 146 K/uL — ABNORMAL LOW (ref 150–400)
Platelets: 194 K/uL (ref 150–400)
RBC: 3.81 MIL/uL — ABNORMAL LOW (ref 3.87–5.11)
RBC: 3.46 MIL/uL — ABNORMAL LOW (ref 3.87–5.11)
RDW: 14.2 % (ref 11.5–15.5)
RDW: 14.3 % (ref 11.5–15.5)
WBC: 10.7 K/uL — ABNORMAL HIGH (ref 4.0–10.5)
WBC: 13.8 K/uL — ABNORMAL HIGH (ref 4.0–10.5)
nRBC: 0 % (ref 0.0–0.2)
nRBC: 0 % (ref 0.0–0.2)

## 2024-07-31 LAB — URINE DRUG SCREEN, QUALITATIVE (ARMC ONLY)
Amphetamines, Ur Screen: NOT DETECTED
Barbiturates, Ur Screen: NOT DETECTED
Benzodiazepine, Ur Scrn: NOT DETECTED
Cannabinoid 50 Ng, Ur ~~LOC~~: NOT DETECTED
Cocaine Metabolite,Ur ~~LOC~~: NOT DETECTED
MDMA (Ecstasy)Ur Screen: NOT DETECTED
Methadone Scn, Ur: NOT DETECTED
Opiate, Ur Screen: NOT DETECTED
Phencyclidine (PCP) Ur S: NOT DETECTED
Tricyclic, Ur Screen: NOT DETECTED

## 2024-07-31 LAB — PROTEIN / CREATININE RATIO, URINE
Creatinine, Urine: 116 mg/dL
Protein Creatinine Ratio: 0.17 mg/mg{creat} — ABNORMAL HIGH (ref 0.00–0.15)
Total Protein, Urine: 20 mg/dL

## 2024-07-31 MED ORDER — OXYTOCIN-SODIUM CHLORIDE 30-0.9 UT/500ML-% IV SOLN
1.0000 m[IU]/min | INTRAVENOUS | Status: DC
  Filled 2024-07-31: qty 500

## 2024-07-31 MED ORDER — OXYCODONE-ACETAMINOPHEN 5-325 MG PO TABS: 1.0000 | ORAL_TABLET | ORAL | Status: DC | PRN

## 2024-07-31 MED ORDER — MISOPROSTOL 200 MCG PO TABS
ORAL_TABLET | ORAL | Status: AC
  Filled 2024-07-31: qty 4

## 2024-07-31 MED ORDER — LACTATED RINGERS IV SOLN: INTRAVENOUS | Status: DC

## 2024-07-31 MED ORDER — DIPHENHYDRAMINE HCL 25 MG PO CAPS: 25.0000 mg | ORAL_CAPSULE | Freq: Four times a day (QID) | ORAL | Status: DC | PRN

## 2024-07-31 MED ORDER — LABETALOL HCL 5 MG/ML IV SOLN: 20.0000 mg | INTRAVENOUS | Status: DC | PRN

## 2024-07-31 MED ORDER — HYDROXYZINE HCL 25 MG PO TABS: 50.0000 mg | ORAL_TABLET | Freq: Four times a day (QID) | ORAL | Status: DC | PRN

## 2024-07-31 MED ORDER — ONDANSETRON HCL 4 MG/2ML IJ SOLN: 4.0000 mg | Freq: Four times a day (QID) | INTRAMUSCULAR | Status: DC | PRN

## 2024-07-31 MED ORDER — CALCIUM CARBONATE ANTACID 500 MG PO CHEW: 2.0000 | CHEWABLE_TABLET | ORAL | Status: DC | PRN

## 2024-07-31 MED ORDER — HYDRALAZINE HCL 20 MG/ML IJ SOLN: 10.0000 mg | INTRAMUSCULAR | Status: DC | PRN

## 2024-07-31 MED ORDER — LIDOCAINE HCL (PF) 1 % IJ SOLN
INTRAMUSCULAR | Status: AC
  Filled 2024-07-31: qty 30

## 2024-07-31 MED ORDER — LABETALOL HCL 5 MG/ML IV SOLN: 80.0000 mg | INTRAVENOUS | Status: DC | PRN

## 2024-07-31 MED ORDER — LABETALOL HCL 5 MG/ML IV SOLN: 40.0000 mg | INTRAVENOUS | Status: DC | PRN

## 2024-07-31 MED ORDER — MISOPROSTOL 25 MCG QUARTER TABLET: 50.0000 ug | ORAL_TABLET | ORAL | Status: DC | PRN

## 2024-07-31 MED ORDER — PHENYLEPHRINE 80 MCG/ML (10ML) SYRINGE FOR IV PUSH (FOR BLOOD PRESSURE SUPPORT): 80.0000 ug | PREFILLED_SYRINGE | INTRAVENOUS | Status: DC | PRN

## 2024-07-31 MED ORDER — WITCH HAZEL-GLYCERIN EX PADS
MEDICATED_PAD | CUTANEOUS | Status: DC | PRN
Start: 2024-07-31 — End: 2024-08-02
  Filled 2024-07-31: qty 100

## 2024-07-31 MED ORDER — LIDOCAINE-EPINEPHRINE (PF) 1.5 %-1:200000 IJ SOLN
INTRAMUSCULAR | Status: DC | PRN
  Administered 2024-07-31: 3 mL via PERINEURAL

## 2024-07-31 MED ORDER — SOD CITRATE-CITRIC ACID 500-334 MG/5ML PO SOLN: 30.0000 mL | ORAL | Status: DC | PRN

## 2024-07-31 MED ORDER — COCONUT OIL OIL: 1.0000 | TOPICAL_OIL | Status: DC | PRN

## 2024-07-31 MED ORDER — OXYTOCIN-SODIUM CHLORIDE 30-0.9 UT/500ML-% IV SOLN: 2.5000 [IU]/h | INTRAVENOUS | Status: DC | PRN

## 2024-07-31 MED ORDER — FENTANYL-BUPIVACAINE-NACL 0.5-0.125-0.9 MG/250ML-% EP SOLN
12.0000 mL/h | EPIDURAL | Status: DC | PRN
  Administered 2024-07-31: 12 mL/h via EPIDURAL
  Filled 2024-07-31: qty 250

## 2024-07-31 MED ORDER — AMMONIA AROMATIC IN INHA
RESPIRATORY_TRACT | Status: AC
  Filled 2024-07-31: qty 10

## 2024-07-31 MED ORDER — PRENATAL MULTIVITAMIN CH
1.0000 | ORAL_TABLET | Freq: Every day | ORAL | Status: DC
  Administered 2024-08-01 – 2024-08-02 (×2): 1 via ORAL
  Filled 2024-07-31 (×2): qty 1

## 2024-07-31 MED ORDER — SIMETHICONE 80 MG PO CHEW: 80.0000 mg | CHEWABLE_TABLET | ORAL | Status: DC | PRN

## 2024-07-31 MED ORDER — ACETAMINOPHEN 325 MG PO TABS: 650.0000 mg | ORAL_TABLET | ORAL | Status: DC | PRN

## 2024-07-31 MED ORDER — ACETAMINOPHEN 500 MG PO TABS
1000.0000 mg | ORAL_TABLET | Freq: Four times a day (QID) | ORAL | Status: DC | PRN
Start: 2024-07-31 — End: 2024-07-31

## 2024-07-31 MED ORDER — DOCUSATE SODIUM 100 MG PO CAPS
100.0000 mg | ORAL_CAPSULE | Freq: Two times a day (BID) | ORAL | Status: DC
  Administered 2024-07-31 – 2024-08-02 (×4): 100 mg via ORAL
  Filled 2024-07-31 (×5): qty 1

## 2024-07-31 MED ORDER — IBUPROFEN 600 MG PO TABS
600.0000 mg | ORAL_TABLET | Freq: Four times a day (QID) | ORAL | Status: DC
  Administered 2024-07-31 – 2024-08-02 (×7): 600 mg via ORAL
  Filled 2024-07-31 (×8): qty 1

## 2024-07-31 MED ORDER — LACTATED RINGERS IV SOLN: 500.0000 mL | Freq: Once | INTRAVENOUS | Status: DC

## 2024-07-31 MED ORDER — TERBUTALINE SULFATE 1 MG/ML IJ SOLN: 0.2500 mg | Freq: Once | INTRAMUSCULAR | Status: DC | PRN

## 2024-07-31 MED ORDER — BENZOCAINE-MENTHOL 20-0.5 % EX AERO
1.0000 | INHALATION_SPRAY | CUTANEOUS | Status: DC | PRN
  Administered 2024-07-31: 1 via TOPICAL
  Filled 2024-07-31: qty 56

## 2024-07-31 MED ORDER — OXYTOCIN BOLUS FROM INFUSION
333.0000 mL | Freq: Once | INTRAVENOUS | Status: AC
  Administered 2024-07-31: 333 mL via INTRAVENOUS

## 2024-07-31 MED ORDER — SODIUM CHLORIDE 0.9 % IV SOLN
INTRAVENOUS | Status: DC | PRN
  Administered 2024-07-31 (×2): 5 mL via EPIDURAL

## 2024-07-31 MED ORDER — NIFEDIPINE ER OSMOTIC RELEASE 30 MG PO TB24
30.0000 mg | ORAL_TABLET | Freq: Every day | ORAL | Status: DC
  Administered 2024-07-31 – 2024-08-01 (×2): 30 mg via ORAL
  Filled 2024-07-31 (×2): qty 1

## 2024-07-31 MED ORDER — DIPHENHYDRAMINE HCL 50 MG/ML IJ SOLN: 12.5000 mg | INTRAMUSCULAR | Status: DC | PRN

## 2024-07-31 MED ORDER — LIDOCAINE HCL (PF) 1 % IJ SOLN
30.0000 mL | INTRAMUSCULAR | Status: DC | PRN
Start: 2024-07-31 — End: 2024-07-31

## 2024-07-31 MED ORDER — EPHEDRINE 5 MG/ML INJ: 10.0000 mg | INTRAVENOUS | Status: DC | PRN

## 2024-07-31 MED ORDER — LACTATED RINGERS IV SOLN: 500.0000 mL | INTRAVENOUS | Status: DC | PRN

## 2024-07-31 MED ORDER — OXYTOCIN 10 UNIT/ML IJ SOLN
INTRAMUSCULAR | Status: AC
  Filled 2024-07-31: qty 2

## 2024-07-31 MED ORDER — OXYTOCIN-SODIUM CHLORIDE 30-0.9 UT/500ML-% IV SOLN: 2.5000 [IU]/h | INTRAVENOUS | Status: DC

## 2024-07-31 MED ORDER — FENTANYL CITRATE (PF) 100 MCG/2ML IJ SOLN: 50.0000 ug | INTRAMUSCULAR | Status: DC | PRN

## 2024-07-31 NOTE — Progress Notes (Signed)
 Labor Progress Note   ASSESSMENT/PLAN   Susan Klein 28 y.o.   G2P1001  at [redacted]w[redacted]d here for labor and GHTN.  FWB:  - Fetal well being assessed: Category 1        GBS: - GBS negative  LABOR: -  Active labor, doing well. - Discussed options with Susan Klein. She would like to proceed with epidural and AROM. We reviewed the risks and benefits. - Anticipate SVD   Labor Progress Cervical Exam  Last 2 exams Dil Eff Sta Exam Time  6 70 -1 1009  5 80 -2 0608    Principal Problem:   Gestational hypertension   SUBJECTIVE/OBJECTIVE   SUBJECTIVE:  Susan Klein is tired. She is feeling painful contractions.  She denies HA, visual changes, and epigastric pain.  OBJECTIVE: Vital Signs: Patient Vitals for the past 12 hrs:  BP Temp Temp src Pulse Resp Height Weight  07/31/24 0835 (!) 153/80 -- -- 66 -- -- --  07/31/24 0805 (!) 155/88 -- -- 68 -- -- --  07/31/24 0735 131/76 -- -- 71 -- -- --  07/31/24 0705 (!) 112/95 -- -- 84 -- -- --  07/31/24 0635 (!) 158/90 -- -- 82 16 -- --  07/31/24 0605 (!) 145/111 -- -- 81 20 -- --  07/31/24 0550 (!) 140/79 -- -- 92 18 -- --  07/31/24 0525 (!) 153/88 -- -- 100 -- -- --  07/31/24 0523 (!) 153/88 97.8 F (36.6 C) Oral 99 20 5' 3 (1.6 m) 93.1 kg    Last SVE:  Dilation: 6 Effacement (%): 70 Cervical Position: Middle Station: -1 Presentation: Vertex Exam by:: EMERSON Canny, CNM  FHR:   - Baseline: 135 - Variability:  moderate - Accels: present - Decels: none Category 1  UTERINE ACTIVITY:  Contractions: q 3-5 minutes  [name] CHRISTELLA Canny, CNM

## 2024-07-31 NOTE — Anesthesia Procedure Notes (Signed)
 Epidural Patient location during procedure: OB Start time: 07/31/2024 11:26 AM End time: 07/31/2024 11:34 AM  Staffing Anesthesiologist: Leavy Ned, MD Performed: anesthesiologist   Preanesthetic Checklist Completed: patient identified, IV checked, site marked, risks and benefits discussed, surgical consent, monitors and equipment checked, pre-op evaluation and timeout performed  Epidural Patient position: sitting Prep: Betadine Patient monitoring: heart rate, continuous pulse ox and blood pressure Approach: midline Location: L3-L4 Injection technique: LOR saline  Needle:  Needle type: Tuohy  Needle gauge: 18 G Needle length: 9 cm and 9 Needle insertion depth: 5 cm Catheter type: closed end flexible Catheter size: 20 Guage Catheter at skin depth: 10 cm Test dose: negative and 1.5% lidocaine  with Epi 1:200 K  Assessment Sensory level: T4 Events: blood not aspirated, no cerebrospinal fluid, injection not painful, no injection resistance, no paresthesia and negative IV test  Additional Notes 1 attempt Pt. Evaluated and documentation done after procedure finished. Patient identified. Risks/Benefits/Options discussed with patient including but not limited to bleeding, infection, nerve damage, paralysis, failed block, incomplete pain control, headache, blood pressure changes, nausea, vomiting, reactions to medication both or allergic, itching and postpartum back pain. Confirmed with bedside nurse the patient's most recent platelet count. Confirmed with patient that they are not currently taking any anticoagulation, have any bleeding history or any family history of bleeding disorders. Patient expressed understanding and wished to proceed. All questions were answered. Sterile technique was used throughout the entire procedure. Please see nursing notes for vital signs. Test dose was given through epidural catheter and negative prior to continuing to dose epidural or start infusion.  Warning signs of high block given to the patient including shortness of breath, tingling/numbness in hands, complete motor block, or any concerning symptoms with instructions to call for help. Patient was given instructions on fall risk and not to get out of bed. All questions and concerns addressed with instructions to call with any issues or inadequate analgesia.    Patient tolerated the insertion well without immediate complications. Reason for block:procedure for pain

## 2024-07-31 NOTE — Progress Notes (Signed)
 Labor Progress Note   ASSESSMENT/PLAN   DELANA MANGANELLO 28 y.o.   G2P1001 with GHTN at [redacted]w[redacted]d here for labor.  FWB:  - Fetal well being assessed: Category 1        GBS: - GBS negative  LABOR: -  Active labor, doing well. - Now fully dilated - Will begin pushing - Pain Management: Epidural - Anticipate SVD   Labor Progress Cervical Exam  Last 3 exams Dil Eff Sta Exam Time  10 100 Plus 1 1513  6 70 -1 1212  6 70 -1 1009    Principal Problem:   Gestational hypertension   SUBJECTIVE/OBJECTIVE   SUBJECTIVE:     OBJECTIVE: Vital Signs: Patient Vitals for the past 12 hrs:  BP Temp Temp src Pulse Resp SpO2 Height Weight  07/31/24 1330 -- -- -- -- -- 94 % -- --  07/31/24 1325 -- -- -- -- -- 94 % -- --  07/31/24 1320 -- -- -- -- -- 95 % -- --  07/31/24 1315 -- -- -- -- -- 95 % -- --  07/31/24 1310 (!) 148/78 -- -- 71 -- 95 % -- --  07/31/24 1305 -- -- -- -- -- 95 % -- --  07/31/24 1300 -- -- -- -- -- 95 % -- --  07/31/24 1255 (!) 165/63 -- -- 79 -- 95 % -- --  07/31/24 1250 -- -- -- -- -- 95 % -- --  07/31/24 1245 -- -- -- -- -- 95 % -- --  07/31/24 1240 (!) 148/72 -- -- 66 -- 96 % -- --  07/31/24 1235 -- -- -- -- -- 95 % -- --  07/31/24 1230 -- -- -- -- -- 96 % -- --  07/31/24 1225 (!) 152/80 -- -- 86 -- 96 % -- --  07/31/24 1220 -- -- -- -- -- 96 % -- --  07/31/24 1215 -- -- -- -- -- 95 % -- --  07/31/24 1210 (!) 143/77 -- -- 98 -- 96 % -- --  07/31/24 1205 (!) 152/79 -- -- 90 -- 95 % -- --  07/31/24 1200 (!) 148/72 -- -- 97 -- 96 % -- --  07/31/24 1155 (!) 155/86 -- -- (!) 102 -- 96 % -- --  07/31/24 1150 (!) 145/76 -- -- (!) 110 -- 96 % -- --  07/31/24 1145 (!) 141/72 -- -- (!) 101 -- 96 % -- --  07/31/24 1143 -- 98.1 F (36.7 C) Oral -- (!) 21 -- -- --  07/31/24 1142 (!) 146/74 -- -- (!) 107 -- -- -- --  07/31/24 1140 -- -- -- -- -- 97 % -- --  07/31/24 1137 (!) 150/86 -- -- 87 -- -- -- --  07/31/24 1135 (!) 146/88 -- -- 90 -- 99 % -- --  07/31/24 1132 (!)  155/88 -- -- 96 -- -- -- --  07/31/24 1130 -- -- -- -- -- 99 % -- --  07/31/24 1127 (!) 165/88 -- -- 82 -- -- -- --  07/31/24 1122 (!) 160/85 -- -- 87 -- -- -- --  07/31/24 1115 (!) 159/99 -- -- 71 -- -- -- --  07/31/24 1005 (!) 153/88 -- -- 76 -- -- -- --  07/31/24 0935 (!) 152/82 -- -- 64 -- -- -- --  07/31/24 0905 (!) 155/86 -- -- 75 -- -- -- --  07/31/24 0835 (!) 153/80 -- -- 66 -- -- -- --  07/31/24 0805 (!) 155/88 -- -- 46 -- -- -- --  07/31/24 0735 131/76 -- -- 71 -- -- -- --  07/31/24 0730 -- 98.3 F (36.8 C) Oral -- 18 -- -- --  07/31/24 0705 (!) 112/95 -- -- 84 -- -- -- --  07/31/24 0635 (!) 158/90 -- -- 82 16 -- -- --  07/31/24 0605 (!) 145/111 -- -- 81 20 -- -- --  07/31/24 0550 (!) 140/79 -- -- 92 18 -- -- --  07/31/24 0525 (!) 153/88 -- -- 100 -- -- -- --  07/31/24 0523 (!) 153/88 97.8 F (36.6 C) Oral 99 20 -- 5' 3 (1.6 m) 93.1 kg    Last SVE:  Dilation: 10 Dilation Complete Date: 07/31/24 Dilation Complete Time: 1513 Effacement (%): 100 Cervical Position: Middle Station: Plus 1 Presentation: Vertex Exam by:: EMERSON Canny, CNM -  , Rupture Date: 07/31/24, Rupture Time: 1213,    FHR:   - Baseline: 125 - Variability: moderate - Accels: present - Decels: early Category 1  UTERINE ACTIVITY:  Contractions: q 2-4 min  [name] CHRISTELLA Canny, CNM
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
## 2024-07-31 NOTE — Progress Notes (Signed)
 Labor Progress Note   ASSESSMENT/PLAN   Susan Klein 28 y.o.   G2P1001  at [redacted]w[redacted]d here for labor with GHTN.  FWB:  - Fetal well being assessed: Category 1        GBS: - GBS negative  LABOR: - Active labor, doing well. - AROM performed with Mabry's consent. Copious clear fluid. - Pain Management: Epidural - Anticipate SVD   Labor Progress Cervical Exam  Last 3 exams Dil Eff Sta Exam Time  6 70 -1 - AROM 1212  6 70 -1 1009  5 80 -2 0608    Principal Problem:   Gestational hypertension   SUBJECTIVE/OBJECTIVE   SUBJECTIVE:  Temprence is comfortable with her epidural. She denies HA, visual changes, and epigastric pain. She had 2 severe range BPs 5 min apart while receiving her epidural and none since.    OBJECTIVE: Vital Signs: Patient Vitals for the past 12 hrs:  BP Temp Temp src Pulse Resp SpO2 Height Weight  07/31/24 1143 -- 98.1 F (36.7 C) Oral -- (!) 21 -- -- --  07/31/24 1142 (!) 146/74 -- -- (!) 107 -- -- -- --  07/31/24 1140 -- -- -- -- -- 97 % -- --  07/31/24 1137 (!) 150/86 -- -- 87 -- -- -- --  07/31/24 1135 (!) 146/88 -- -- 90 -- 99 % -- --  07/31/24 1132 (!) 155/88 -- -- 96 -- -- -- --  07/31/24 1130 -- -- -- -- -- 99 % -- --  07/31/24 1127 (!) 165/88 -- -- 82 -- -- -- --  07/31/24 1122 (!) 160/85 -- -- 87 -- -- -- --  07/31/24 1115 (!) 159/99 -- -- 71 -- -- -- --  07/31/24 1005 (!) 153/88 -- -- 76 -- -- -- --  07/31/24 0935 (!) 152/82 -- -- 64 -- -- -- --  07/31/24 0905 (!) 155/86 -- -- 75 -- -- -- --  07/31/24 0835 (!) 153/80 -- -- 66 -- -- -- --  07/31/24 0805 (!) 155/88 -- -- 68 -- -- -- --  07/31/24 0735 131/76 -- -- 71 -- -- -- --  07/31/24 0730 -- 98.3 F (36.8 C) Oral -- 18 -- -- --  07/31/24 0705 (!) 112/95 -- -- 84 -- -- -- --  07/31/24 0635 (!) 158/90 -- -- 82 16 -- -- --  07/31/24 0605 (!) 145/111 -- -- 81 20 -- -- --  07/31/24 0550 (!) 140/79 -- -- 92 18 -- -- --  07/31/24 0525 (!) 153/88 -- -- 100 -- -- -- --  07/31/24 0523 (!)  153/88 97.8 F (36.6 C) Oral 99 20 -- 5' 3 (1.6 m) 93.1 kg    Last SVE:  Dilation: 6 Effacement (%): 70 Cervical Position: Middle Station: -1 Presentation: Vertex Exam by:: Geraline Halberstadt CNM -  , Rupture Date: 07/31/24, Rupture Time: 1213,    FHR:   - Baseline: 130 - Variability: moderate - Accels: present - Decels: early Category 1  UTERINE ACTIVITY:  Contractions: q 2-5 min  [name] CHRISTELLA Canny, CNM

## 2024-07-31 NOTE — Progress Notes (Addendum)
First push, small crown noted
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
## 2024-08-01 LAB — COMPREHENSIVE METABOLIC PANEL WITH GFR
ALT: 14 U/L (ref 0–44)
AST: 26 U/L (ref 15–41)
Albumin: 2.2 g/dL — ABNORMAL LOW (ref 3.5–5.0)
Alkaline Phosphatase: 93 U/L (ref 38–126)
Anion gap: 13 (ref 5–15)
BUN: 7 mg/dL (ref 6–20)
CO2: 20 mmol/L — ABNORMAL LOW (ref 22–32)
Calcium: 8.4 mg/dL — ABNORMAL LOW (ref 8.9–10.3)
Chloride: 108 mmol/L (ref 98–111)
Creatinine, Ser: 0.57 mg/dL (ref 0.44–1.00)
GFR, Estimated: >60 mL/min (ref >60)
Glucose, Bld: 71 mg/dL (ref 70–99)
Potassium: 3.7 mmol/L (ref 3.5–5.1)
Sodium: 141 mmol/L (ref 135–145)
Total Bilirubin: <0.2 mg/dL (ref 0.0–1.2)
Total Protein: 5.2 g/dL — ABNORMAL LOW (ref 6.5–8.1)

## 2024-08-01 LAB — CBC
HCT: 29.6 % — ABNORMAL LOW (ref 36.0–46.0)
Hemoglobin: 9.4 g/dL — ABNORMAL LOW (ref 12.0–15.0)
MCH: 28.5 pg (ref 26.0–34.0)
MCHC: 31.8 g/dL (ref 30.0–36.0)
MCV: 89.7 fL (ref 80.0–100.0)
Platelets: 167 K/uL (ref 150–400)
RBC: 3.3 MIL/uL — ABNORMAL LOW (ref 3.87–5.11)
RDW: 14.3 % (ref 11.5–15.5)
WBC: 9.9 K/uL (ref 4.0–10.5)
nRBC: 0 % (ref 0.0–0.2)

## 2024-08-01 LAB — RPR: RPR Ser Ql: NONREACTIVE

## 2024-08-01 MED ORDER — LABETALOL HCL 200 MG PO TABS
200.0000 mg | ORAL_TABLET | Freq: Two times a day (BID) | ORAL | Status: DC
  Administered 2024-08-02 (×2): 200 mg via ORAL
  Filled 2024-08-01 (×2): qty 1

## 2024-08-01 NOTE — Lactation Note (Signed)
 This note was copied from a baby's chart. Lactation Consultation Note  Patient Name: Susan Klein Unijb'd Date: 08/01/2024 Age:26 hours Reason for consult: Initial assessment;Early term 37-38.6wks;Other (Comment) (Discharge Education)   Maternal Data Does the patient have breastfeeding experience prior to this delivery?: Yes How long did the patient breastfeed?: 14 months  Initial assessment w/ a 50hr old baby and parents.  This was a SVD.  Patient w/ hx of gHTN, and recurrent major depressive disorder. Mom stated that she breastfed her first baby for 14months.   Feeding Mother's Current Feeding Choice: Breast Milk  Interventions Interventions: Breast feeding basics reviewed;Education;CDC milk storage guidelines  LC provided education on the following;  milk production expectations, hunger cues, day 1/2 wet/dirty diapers, hand expression, cluster feeding, benefits of STS and arousing infant for a feeding.  Lactation informed patient of feeding infant at least 8 or more times w/in a 24hr period but not exceeding 3hrs. Patient verbalized understanding.   Discharge Discharge Education: Warning signs for feeding baby;Engorgement and breast care;Outpatient recommendation Pump: Personal;Hands [place]teen Toops;DEBP;Manual WIC Program: Yes  Education on engorgement prevention/treatment was discussed as well as breastmilk storage guidelines.  LC provided patient with a handout on breastmilk storage guidelines from [facility]. [facility] outpatient lactation services phone number written on the white board in the room.  Patient verbalized understanding.  LC also provided education from the postpartum book about warning signs to look for in a poor feeding.    Consult Status Consult Status: PRN    Krystalynn Ridgeway S Johnnye Sandford 08/01/2024, 1:52 PM

## 2024-08-01 NOTE — Anesthesia Postprocedure Evaluation (Signed)
 Anesthesia Post Note  Patient: Susan Klein  Procedure(s) Performed: AN AD HOC LABOR EPIDURAL  Patient location during evaluation: Mother Baby Anesthesia Type: Epidural Level of consciousness: oriented and awake and alert Pain management: pain level controlled Vital Signs Assessment: post-procedure vital signs reviewed and stable Respiratory status: spontaneous breathing and respiratory function stable Cardiovascular status: blood pressure returned to baseline and stable Postop Assessment: no headache, no backache, no apparent nausea or vomiting and able to ambulate Anesthetic complications: no   No notable events documented.   Last Vitals:  Vitals:   07/31/24 2344 08/01/24 0350  BP: (!) 145/84 (!) 145/68  Pulse: 92 76  Resp: 18 18  Temp: 37 C 37.1 C  SpO2: 98% 98%    Last Pain:  Vitals:   08/01/24 0536  TempSrc:   PainSc: 0-No pain                 [place] LABOR

## 2024-08-01 NOTE — Progress Notes (Signed)
 Subjective:   SOFYA MOUSTAFA had a NSVB on 07/31/2024. Her labor was complicated by [facility]. Has had routine postpartum care.  Eating, hydrating, and voiding regularly without difficulty. Since delivery has not had BM. She is breast feeding. Reports mild/moderate vaginal bleeding, denies passing large blood clots. Has had cramping abdomen pain relieved with tylenol /ibuprofen . Plans to use Unsure for contraception. Denies anxiety/depression symptoms. Endorses good support from partner and family.   Objective:  Vital signs in last 24 hours: Temp:  [98 F (36.7 C)-99.1 F (37.3 C)] 98 F (36.7 C) (10/13 0825) Pulse Rate:  [62-110] 79 (10/13 0825) Resp:  [17-21] 20 (10/13 0825) BP: (128-165)/(57-99) 141/79 (10/13 0825) SpO2:  [94 %-99 %] 99 % (10/13 0825)    General: NAD Pulmonary: no increased work of breathing Breasts: soft, non-tender, nipples without breakdown Abdomen: soft, non-tender Fundus: firm, midline, at umbilicus Lochia: light rubra, no clots Perineum: no erythema or foul odor discharge, minimal edema, laceration well approximated  Extremities: no edema, no erythema, no tenderness  Results for orders placed or performed during the hospital encounter of 07/31/24 (from the past 72 hours)  Type and screen     Status: None   Collection Time: 07/31/24  5:46 AM  Result Value Ref Range   ABO/RH(D) O POS    Antibody Screen NEG    Sample Expiration      08/03/2024,2359 Performed at [facility], [address] [place], [place] [address]   CBC     Status: Abnormal   Collection Time: 07/31/24  5:49 AM  Result Value Ref Range   WBC 10.7 (H) 4.0 - 10.5 K/uL   RBC 3.81 (L) 3.87 - 5.11 MIL/uL   Hemoglobin 11.1 (L) 12.0 - 15.0 g/dL   HCT 66.2 (L) 63.9 - 53.9 %   MCV 88.5 80.0 - 100.0 fL   MCH 29.1 26.0 - 34.0 pg   MCHC 32.9 30.0 - 36.0 g/dL   RDW 85.7 88.4 - 84.4 %   Platelets 146 (L) 150 - 400 K/uL   nRBC 0.0 0.0 - 0.2 %    Comment: Performed at [facility], [address] [place], [place] [address]  Comprehensive metabolic panel     Status: Abnormal   Collection Time: 07/31/24  5:49 AM  Result Value Ref Range   Sodium 138 135 - 145 mmol/L   Potassium 3.2 (L) 3.5 - 5.1 mmol/L   Chloride 107 98 - 111 mmol/L   CO2 20 (L) 22 - 32 mmol/L   Glucose, Bld 95 70 - 99 mg/dL    Comment: Glucose reference range applies only to samples taken after fasting for at least 8 hours.   BUN 8 6 - 20 mg/dL   Creatinine, Ser 9.39 0.44 - 1.00 mg/dL   Calcium 8.8 (L) 8.9 - 10.3 mg/dL   Total Protein 6.0 (L) 6.5 - 8.1 g/dL   Albumin 2.6 (L) 3.5 - 5.0 g/dL   AST 25 15 - 41 U/L   ALT 14 0 - 44 U/L   Alkaline Phosphatase 115 38 - 126 U/L   Total Bilirubin 0.4 0.0 - 1.2 mg/dL   GFR, Estimated >39 >39 mL/min    Comment: (NOTE) Calculated using the CKD-EPI Creatinine Equation (2021)    Anion gap 11 5 - 15    Comment: Performed at [facility], [address] [place], [place] [address]  Protein / creatinine ratio, urine     Status: Abnormal   Collection Time: 07/31/24  5:59 AM  Result Value Ref Range   Creatinine, Urine 116 mg/dL   Total Protein, Urine 20 mg/dL    Comment: NO NORMAL RANGE ESTABLISHED FOR THIS TEST   Protein Creatinine Ratio 0.17 (H) 0.00 - 0.15 mg/mg[Cre]    Comment: Performed at [facility], [address] [place], [place] [address]  Urinalysis, Routine w reflex microscopic -Urine, Clean Catch     Status: Abnormal   Collection Time: 07/31/24  5:59 AM  Result Value Ref Range   Color, Urine YELLOW (A) YELLOW   APPearance HAZY (A) CLEAR   Specific Gravity, Urine 1.017 1.005 - 1.030   pH 6.0 5.0 - 8.0   Glucose, UA NEGATIVE NEGATIVE mg/dL   Hgb urine dipstick SMALL (A) NEGATIVE   Bilirubin Urine NEGATIVE NEGATIVE   Ketones, ur NEGATIVE NEGATIVE mg/dL   Protein, ur NEGATIVE NEGATIVE mg/dL   Nitrite NEGATIVE NEGATIVE   Leukocytes,Ua MODERATE (A) NEGATIVE   RBC / HPF 21-50 0 - 5 RBC/hpf   WBC, UA >50 0 -  5 WBC/hpf   Bacteria, UA NONE SEEN NONE SEEN   Squamous Epithelial / HPF 6-10 0 - 5 /HPF   Mucus PRESENT    Non Squamous Epithelial PRESENT (A) NONE SEEN    Comment: Performed at [facility], [address] [place], [place] [address]  Urine Drug Screen, Qualitative ([facility] only)     Status: None   Collection Time: 07/31/24  5:59 AM  Result Value Ref Range   Tricyclic, Ur Screen NONE DETECTED NONE DETECTED   Amphetamines, Ur Screen NONE DETECTED NONE DETECTED   MDMA (Ecstasy)Ur Screen NONE DETECTED NONE DETECTED   Cocaine Metabolite,Ur [place] NONE DETECTED NONE DETECTED   Opiate, Ur Screen NONE DETECTED NONE DETECTED   Phencyclidine (PCP) Ur S NONE DETECTED NONE DETECTED   Cannabinoid 50 Ng, Ur [place] NONE DETECTED NONE DETECTED   Barbiturates, Ur Screen NONE DETECTED NONE DETECTED   Benzodiazepine, Ur Scrn NONE DETECTED NONE DETECTED   Methadone Scn, Ur NONE DETECTED NONE DETECTED    Comment: (NOTE) Tricyclics + metabolites, urine    Cutoff 1000 ng/mL Amphetamines + metabolites, urine  Cutoff 1000 ng/mL MDMA (Ecstasy), urine              Cutoff 500 ng/mL Cocaine Metabolite, urine          Cutoff 300 ng/mL Opiate + metabolites, urine        Cutoff 300 ng/mL Phencyclidine (PCP), urine         Cutoff 25 ng/mL Cannabinoid, urine                 Cutoff 50 ng/mL Barbiturates + metabolites, urine  Cutoff 200 ng/mL Benzodiazepine, urine              Cutoff 200 ng/mL Methadone, urine                   Cutoff 300 ng/mL  The urine drug screen provides only a preliminary, unconfirmed analytical test result and should not be used for non-medical purposes. Clinical consideration and professional judgment should be applied to any positive drug screen result due to possible interfering substances. A more specific alternate chemical method must be used in order to obtain a confirmed analytical result. Gas chromatography / mass spectrometry (GC/MS) is the preferred confirm atory  method. Performed at [facility], [address], [place], [place] [address]   CBC     Status: Abnormal   Collection Time: 07/31/24  9:17 PM  Result Value Ref Range   WBC 13.8 (H) 4.0 - 10.5 K/uL   RBC 3.46 (L) 3.87 - 5.11 MIL/uL   Hemoglobin 10.1 (L) 12.0 - 15.0 g/dL   HCT 69.6 (L) 63.9 - 53.9 %   MCV 87.6 80.0 - 100.0 fL   MCH 29.2 26.0 - 34.0 pg   MCHC 33.3 30.0 - 36.0 g/dL   RDW 85.6 88.4 - 84.4 %   Platelets 194 150 - 400 K/uL   nRBC 0.0 0.0 - 0.2 %    Comment: Performed at [facility], [address] [place], [place] [address]  CBC     Status: Abnormal   Collection Time: 08/01/24  8:20 AM  Result Value Ref Range   WBC 9.9 4.0 - 10.5 K/uL   RBC 3.30 (L) 3.87 - 5.11 MIL/uL   Hemoglobin 9.4 (L) 12.0 - 15.0 g/dL   HCT 70.3 (L) 63.9 - 53.9 %   MCV 89.7 80.0 - 100.0 fL   MCH 28.5 26.0 - 34.0 pg   MCHC 31.8 30.0 - 36.0 g/dL   RDW 85.6 88.4 - 84.4 %   Platelets 167 150 - 400 K/uL   nRBC 0.0 0.0 - 0.2 %    Comment: Performed at [facility], [address] [place], [place] [address]  Comprehensive metabolic panel     Status: Abnormal   Collection Time: 08/01/24  8:20 AM  Result Value Ref Range   Sodium 141 135 - 145 mmol/L   Potassium 3.7 3.5 - 5.1 mmol/L   Chloride 108 98 - 111 mmol/L   CO2 20 (L) 22 - 32 mmol/L   Glucose, Bld 71 70 - 99 mg/dL    Comment: Glucose reference range applies only to samples taken after fasting for at least 8 hours.   BUN 7 6 - 20 mg/dL   Creatinine, Ser 9.42 0.44 - 1.00 mg/dL   Calcium 8.4 (L) 8.9 - 10.3 mg/dL   Total Protein 5.2 (L) 6.5 - 8.1 g/dL   Albumin 2.2 (L) 3.5 - 5.0 g/dL   AST 26 15 - 41 U/L   ALT 14 0 - 44 U/L   Alkaline Phosphatase 93 38 - 126 U/L   Total Bilirubin <0.2 0.0 - 1.2 mg/dL   GFR, Estimated >39 >39 mL/min    Comment: (NOTE) Calculated using the CKD-EPI Creatinine Equation (2021)    Anion gap 13 5 - 15    Comment: Performed at [facility], [address],  [place], [place] [address]     Assessment:   28 y.o. [id number] postpartum day 1 s/p spontaneous vaginal delivery on 07/31/2024 Breast Anemia secondary to acute blood loss, is not significant for admission- none Vital signs Reviewed and stable, BP remains mild range despite initiation of Nifedipine Pain well controlled  Plan:    PO Fe Blood Type --/--/O POS (10/12 0546) / Rubella 3.58 (04/21 1537) / Varicella Immune Rhogam: not indicated Tdap: Given prenatally Varicella:not indicated Rubella: not indicated Flu: Given prenatally Feeding plan breast, lactation support Encouraged to continue breastfeeding, BF education on latch, position changes, cluster feeding, hunger cues, lactogenesis II, milk supply Education given regarding options for contraception, as well as compatibility with breast feeding if applicable.  Patient plans undecided for contraception. Continued routine postpartum care  Counseled on normal uterine involution and vaginal bleeding postpartum Continue to monitor blood pressure, consider increase in Nifedipine if BP remains mild range 24h after first dose of Nifedipine. Anticipate discharge home tomorrow  Harlene LITTIE Cisco, CNM [facility] 08/01/2024, 9:40 AM

## 2024-08-02 ENCOUNTER — Encounter: Admitting: Certified Nurse Midwife

## 2024-08-02 ENCOUNTER — Telehealth: Payer: Self-pay | Admitting: Obstetrics

## 2024-08-02 ENCOUNTER — Other Ambulatory Visit

## 2024-08-02 MED ORDER — LABETALOL HCL 200 MG PO TABS: 200.0000 mg | ORAL_TABLET | Freq: Two times a day (BID) | ORAL | 3 refills | Status: DC

## 2024-08-02 NOTE — Discharge Summary (Signed)
 Postpartum Discharge Summary  Date of Service updated 08/02/2024     Patient Name: Susan Klein DOB: 1995/12/10 MRN: [id number]  Date of admission: 07/31/2024 Delivery date:07/31/2024 Delivering provider: JUSTINO [name] HERO Date of discharge: 08/02/2024  Admitting diagnosis: Gestational hypertension [O13.9] Intrauterine pregnancy: [redacted]w[redacted]d     Secondary diagnosis:  Principal Problem:   Gestational hypertension Active Problems:   Postpartum care following vaginal delivery   Vaginal delivery  Additional problems: none    Discharge diagnosis: Term Pregnancy Delivered and Gestational Hypertension                                              Post partum procedures:none Augmentation: N/A Complications: None  Hospital course: Onset of Labor With Vaginal Delivery      28 y.o. yo [id number] at [redacted]w[redacted]d was admitted in Active Labor on 07/31/2024. Labor course was uncomplicated   Membrane Rupture Time/Date: 12:13 PM,07/31/2024  Delivery Method:Vaginal, Spontaneous Operative Delivery:N/A Episiotomy: None Lacerations:  1st degree Patient had a postpartum course uncomplicated .  She is ambulating, tolerating a regular diet, passing flatus, and urinating well. Patient is discharged home in stable condition on 08/02/24.  Newborn Data: Birth date:07/31/2024 Birth time:3:53 PM Gender:Female Living status:Living Apgars:9 ,9  Weight:3500 g  Magnesium Sulfate received: No BMZ received: No Rhophylac:N/A MMR:N/A T-DaP:Given prenatally Flu: given prenatally  RSV Vaccine received: Yes Transfusion:No Immunizations administered: Immunization History  Administered Date(s) Administered    sv, Bivalent, Protein Subunit Rsvpref,pf (Abrysvo) 07/05/2024   DTaP 12/14/1998, 03/04/1999, 07/15/1999, 01/14/2000, 04/18/2004   HIB (PRP-OMP) 12/14/1998, 03/04/1999, 07/15/1999, 01/14/2000   Hepatitis A 06/03/2010, 11/24/2011   Hepatitis B 07/15/1999, 01/14/2000, 04/13/2000   Hpv-Unspecified  06/03/2010, 11/24/2011, 01/13/2013   IPV 12/14/1998, 03/04/1999, 10/16/1999, 04/18/2004   Influenza, Seasonal, Injecte, Preservative Fre 07/05/2024   Influenza,inj,Quad PF,6+ Mos 08/06/2017, 07/19/2019, 08/10/2020, 07/01/2022   MMR 10/16/1999, 04/18/2004   MenQuadfi_Meningococcal Groups ACYW Conjugate 06/03/2010, 07/27/2015   Pneumococcal-Unspecified 10/16/1999, 01/14/2000   Td 06/03/2010   Tdap 10/24/2017, 05/23/2024   Varicella 10/16/1999, 06/03/2010    Physical exam  Vitals:   08/01/24 1935 08/01/24 2337 08/02/24 0330 08/02/24 0810  BP: (!) 155/77 (!) 158/80 130/67 (!) 141/88  Pulse: 93 90 75 88  Resp: 18 19 17 16   Temp: 98.9 F (37.2 C) 98.5 F (36.9 C) 98.3 F (36.8 C) 98.1 F (36.7 C)  TempSrc: Oral Oral Oral Oral  SpO2: 99% 98% 99% 99%  Weight:      Height:       General: alert, cooperative, and no distress Lochia: appropriate Uterine Fundus: firm@u_1  Incision: N/A DVT Evaluation: No evidence of DVT seen on physical exam. No cords or calf tenderness. No significant calf/ankle edema. Labs: Lab Results  Component Value Date   WBC 9.9 08/01/2024   HGB 9.4 (L) 08/01/2024   HCT 29.6 (L) 08/01/2024   MCV 89.7 08/01/2024   PLT 167 08/01/2024      Latest Ref Rng & Units 08/01/2024    8:20 AM  CMP  Glucose 70 - 99 mg/dL 71   BUN 6 - 20 mg/dL 7   Creatinine 9.55 - 8.99 mg/dL 9.42   Sodium 864 - 854 mmol/L 141   Potassium 3.5 - 5.1 mmol/L 3.7   Chloride 98 - 111 mmol/L 108   CO2 22 - 32 mmol/L 20   Calcium 8.9 - 10.3 mg/dL 8.4  Total Protein 6.5 - 8.1 g/dL 5.2   Total Bilirubin 0.0 - 1.2 mg/dL <9.7   Alkaline Phos 38 - 126 U/L 93   AST 15 - 41 U/L 26   ALT 0 - 44 U/L 14    Edinburgh Score:    07/31/2024    8:25 PM  Edinburgh Postnatal Depression Scale Screening Tool  I have been able to laugh and see the funny side of things. 0  I have looked forward with enjoyment to things. 0  I have blamed myself unnecessarily when things went wrong. 1  I have  been anxious or worried for no good reason. 0  I have felt scared or panicky for no good reason. 0  Things have been getting on top of me. 0  I have been so unhappy that I have had difficulty sleeping. 0  I have felt sad or miserable. 0  I have been so unhappy that I have been crying. 0  The thought of harming myself has occurred to me. 0  Edinburgh Postnatal Depression Scale Total 1      After visit meds:  Allergies as of 08/02/2024       Reactions   Guaifenesin & Derivatives    Mucinex [guaifenesin Er] Hives        Medication List     STOP taking these medications    amoxicillin  875 MG tablet Commonly known as: AMOXIL    aspirin  81 MG chewable tablet       TAKE these medications    fluticasone  50 MCG/ACT nasal spray Commonly known as: FLONASE  Place 1 spray into both nostrils daily.   labetalol  200 MG tablet Commonly known as: NORMODYNE  Take 1 tablet (200 mg total) by mouth 2 (two) times daily.   Prenatal Vitamin 27-0.8 MG Tabs Take 1 tablet by mouth daily.         Discharge home in stable condition Infant Feeding: Bottle and Breast Infant Disposition:home with mother Discharge instruction: per After Visit Summary and Postpartum booklet. Activity: Advance as tolerated. Pelvic rest for 6 weeks.  Diet: routine diet Anticipated Birth Control: Unsure, declines at this time  Postpartum Appointment:2-3 days Additional Postpartum F/U: BP check 2-3 days Future Appointments:No future appointments. Follow up Visit:  Follow-up Information     Justino [name] HERO, CNM. Schedule an appointment as soon as possible for a visit.   Specialty: Obstetrics Why: BP check Video visit in 2 weeks Office visit n 6 weeks Contact information: [address] [place] [address] [phone]                     08/02/2024 Zelda Hummer, CNM

## 2024-08-02 NOTE — Telephone Encounter (Signed)
-----   Message from Zelda Hummer sent at 08/02/2024  9:18 AM EDT ----- She need BP check this Friday. @ week video visit and 6 week ppv with Missy.   Thanks  Zelda

## 2024-08-02 NOTE — Telephone Encounter (Signed)
 Contacted the patient via phone, left message for the patient to call back to confirm scheduled appointments.

## 2024-08-02 NOTE — Discharge Instructions (Signed)

## 2024-08-02 NOTE — Lactation Note (Signed)
 This note was copied from a baby's chart. Lactation Consultation Note  Patient Name: Susan Klein Date: 08/02/2024 Age:26 hours Reason for consult: Follow-up assessment   Maternal Data Follow up assessment w/ parents and infant.  Mom stated that they gave infant some formula because they felt infant was not receiving enough milk from mom.  Mom expressed she does believe he was cluster feeding.   Feeding Mother's Current Feeding Choice: Breast Milk  Interventions Interventions: Education  LC reviewed cluster feeding and gave parents time to ask questions if they had any concerns.  Consult Status Consult Status: Complete    Ricky RAMAN Erline Siddoway 08/02/2024, 1:43 PM

## 2024-08-02 NOTE — Plan of Care (Signed)
 Patient discharged home with family.  Discharge instructions, when to follow up, and prescriptions reviewed with patient.  Patient verbalized understanding. Patient will be escorted out by auxiliary.   Elyn Sharps, RN 08/02/24 @1335 

## 2024-08-05 ENCOUNTER — Ambulatory Visit (INDEPENDENT_AMBULATORY_CARE_PROVIDER_SITE_OTHER)

## 2024-08-05 VITALS — BP 137/81 | HR 79 | Resp 16 | Ht 63.0 in | Wt 193.0 lb

## 2024-08-05 DIAGNOSIS — Z0130 Encounter for examination of blood pressure without abnormal findings: Secondary | ICD-10-CM

## 2024-08-05 NOTE — Progress Notes (Signed)
    NURSE VISIT NOTE  Subjective:    Patient ID: Susan Klein, female    DOB: 1996/08/13, 28 y.o.   MRN: [id number]  HPI Patient is a 28 y.o. [id number] female who presents for BP check per order from [name] Canny, CNM.   Patient reports compliance with prescribed BP medications: yes Labetalol  200 mgtwice daily Last dose of BP medication: 10/17 at 7:20 am  BP Readings from Last 3 Encounters:  08/05/24 137/81  08/02/24 (!) 142/79  07/28/24 (!) 148/77   Pulse Readings from Last 3 Encounters:  08/05/24 79  08/02/24 94  07/28/24 97    Objective:    BP 137/81   Pulse 79   Resp 16   Ht 5' 3 (1.6 m)   Wt 193 lb (87.5 kg)   BMI 34.19 kg/m   Assessment:   1. Blood pressure check      Plan:   Per [name] Canny, CNM:  Continue current treatment regimen. Report any reading >140/90 or with any associated symptoms. Return to clinic as scheduled.  Patient verbalized understanding of instructions.    Camelia Fetters, CMA [facility]

## 2024-08-05 NOTE — Patient Instructions (Signed)
 High Blood Pressure After Pregnancy: What to Know High blood pressure after pregnancy, or postpartum hypertension, is blood pressure that is higher than normal after childbirth. It's most common within 1 to 2 days after delivery, but it can happen later. Sometimes, it can happen up to 12 weeks or more after pregnancy. Some people have to have medical treatment to control high blood pressure and prevent serious complications. What are the causes? The cause of this condition is not well understood. In some cases, the cause may not be known. Certain conditions may increase your risk. These include: Hypertension that existed before pregnancy (chronic hypertension). Hypertension that happens as a result of pregnancy (gestational hypertension). Hypertensive disorders during pregnancy or seizures in women who have high blood pressure during pregnancy. These conditions are called pre-eclampsia and eclampsia. A condition in which the liver, platelets, and red blood cells are damaged during pregnancy (HELLP syndrome). Obesity. Diabetes. What increases the risk? If you had blood pressure problems during pregnancy, you're more likely to get this condition after giving birth. However, you can still have high blood pressure after delivery even if you didn't have problems during pregnancy. What are the signs or symptoms? Signs and symptoms may include: Headaches. These may be mild, moderate, or severe. They may also be steady, constant, or sudden (thunderclap headache). Vision changes, such as blurry vision, flashing lights, or seeing spots. Nausea and vomiting. Pain in the upper right side of your abdomen. Shortness of breath or trouble breathing. Swelling in your face or hands. A decrease in the amount of urine that you pass. You may not have any signs or symptoms. How is this diagnosed? This condition may be diagnosed based on the results of a physical exam, blood pressure measurements, and blood and pee  (urine) tests. You may also have other tests, such as a CT scan or an MRI, to check for other problems. How is this treated? If blood pressure is high enough to require treatment, your options may include: Medicines to reduce blood pressure (antihypertensives). Tell your health care provider if you are breastfeeding or if you plan to breastfeed. There are many antihypertensive medicines that are safe to take while breastfeeding. Treating medical conditions that are causing hypertension. Treating the complications of hypertension, such as seizures, stroke, or kidney problems. Your health care provider will also continue to closely watch your blood pressure. Follow these instructions at home: Learn your goal blood pressure Two numbers make up your blood pressure. The first number is called systolic pressure. The second is called diastolic pressure. An example of a blood pressure reading is "120 over 80" (or 120/80). Ask your health care provider what your goal blood pressure is. Know how to take your blood pressure To check your blood pressure, follow the instructions in the manual that came with your blood pressure monitor. This includes any instructions on what to do before taking your blood pressure. Record your blood pressure readings Follow your health care provider's instructions on how to record your blood pressure readings. Your health care provider may ask you to: Get one reading in the morning (a.m.) before you take any medicines. Get one reading in the evening (p.m.) before supper. Take at least 2 readings with each blood pressure check. This makes sure the results are correct. Wait 1-2 minutes between measurements.  General instructions Take over-the-counter and prescription medicines only as told by your health care provider. Do not use any products that contain nicotine or tobacco. These products include cigarettes, chewing  tobacco, and vaping devices, such as e-cigarettes. If you  need help quitting, ask your health care provider. Check your blood pressure as often as told by your health care provider. Return to your normal activities as told by your health care provider. Ask your health care provider what activities are safe for you. Keep all follow-up visits. Your health care provider will continue to closely watch your blood pressure. Contact a health care provider if: You have new symptoms, such as: A headache that does not get better. Dizziness. Vision changes. Nausea and vomiting. Get help right away if: You have trouble breathing. You have chest pain. You faint. You have any symptoms of a stroke. "BE FAST" is an easy way to remember the main warning signs of a stroke: B - Balance. Signs are dizziness, sudden trouble walking, or loss of balance. E - Eyes. Signs are trouble seeing or a sudden change in vision. F - Face. Signs are sudden weakness or numbness of the face, or the face or eyelid drooping on one side. A - Arms. Signs are weakness or numbness in an arm. This happens suddenly and usually on one side of the body. S - Speech. Signs are sudden trouble speaking, slurred speech, or trouble understanding what people say. T - Time. Time to call emergency services. Write down what time symptoms started. You have other signs of a stroke, such as: A sudden, severe headache with no known cause. Nausea or vomiting. Seizure. These symptoms may be an emergency. Get help right away. Call 911. Do not wait to see if the symptoms will go away. Do not drive yourself to the hospital. This information is not intended to replace advice given to you by your health care provider. Make sure you discuss any questions you have with your health care provider. Document Revised: 05/20/2023 Document Reviewed: 01/07/2022 Elsevier Patient Education  2024 [employer].

## 2024-08-17 ENCOUNTER — Telehealth (INDEPENDENT_AMBULATORY_CARE_PROVIDER_SITE_OTHER): Admitting: Obstetrics

## 2024-08-17 NOTE — Progress Notes (Signed)
   Virtual Visit via Video Note   I connected with Susan Klein  on 08/17/24 3:31 PM by a video enabled telemedicine application and verified that I am speaking with the correct person using two identifiers.   Location: Patient: home Provider: [facility]   I discussed the limitations of evaluation and management by telemedicine and the availability of in person appointments. The patient expressed understanding and agreed to proceed. Subjective:    Subjective  Susan Klein is a 28 y.o. female who presents for a postpartum visit. She is2.5 weeks  postpartum following a spontaneous vaginal delivery. I have reviewed the prenatal and intrapartum course and was present at the birth. The delivery was at [redacted]w[redacted]d .  Anesthesia: epidural.    Postpartum course has been normal. She has no pain and no other concerns. Baby's course has been normal. Baby is feeding by  breast. Bleeding is light. Bowel function is normal. Bladder function is normal.  .  Contraception method: undecided. Postpartum depression screening: negative EPDS 3. Reports occasional anxiety  She is still taking labetalol . Denies HA, visual changes, RUQ pain   Review of Systems Pertinent positives noted in HPI. Remainder of comprehensive ROS otherwise negative.     Objective:      Objective [] Expand by Default There were no vitals taken for this visit.  General:  alert, cooperative, and no distress        Assessment:    Normal postpartum course, mood stable.   Plan:      Plan -Anticipatory guidance about the postpartum period -Reviewed s/s of PPD and PPA -Discussed when to seek medical care -Office visit at 6 weeks postpartum  I discussed the assessment and treatment plan with the patient. The patient was provided an opportunity to ask questions and all were answered. The patient agreed with the plan and demonstrated an understanding of the instructions.   The patient was advised to call back or seek an  in-person evaluation if the symptoms worsen or if the condition fails to improve as anticipated.   I provided 7 minutes of non-face-to-face time during this encounter.     Shikira Folino M Kimbley Sprague, CNM

## 2024-08-22 ENCOUNTER — Ambulatory Visit (INDEPENDENT_AMBULATORY_CARE_PROVIDER_SITE_OTHER): Admitting: Certified Nurse Midwife

## 2024-08-22 NOTE — Progress Notes (Signed)
    Postpartum PROGRESS NOTE  Subjective:    Patient ID: Susan Klein, female    DOB: 08/21/1996, 28 y.o.   MRN: [id number]  HPI  Patient is a 28 y.o. G71P2002 female who presents for evaluation of postpartum prolapse. She became constipated three days ago and felt like there was something in her vagina. It is worse with bowel movements.   The following portions of the patient's history were reviewed and updated as appropriate: allergies, current medications, past family history, past medical history, past social history, past surgical history, and problem list.  Review of Systems Pertinent items are noted in HPI.   Objective:   Blood pressure 120/61, pulse 69, resp. rate 16, height 5' 3 (1.6 m), weight 179 lb 6.4 oz (81.4 kg), unknown if currently breastfeeding. Body mass index is 31.78 kg/m. General appearance: alert Pelvic: No rectal or bladder prolapse. No cervical prolapse.     Assessment:   1. Postpartum care and examination      Plan:   -Normal postpartum exam. Pelvic floor is a bit lower than expected but that may resolve before her 6 week visit. Will reassess then. Reviewed constipation. Will take daily colace and Miralax PRN.   Damien Parsley, CNM [facility]

## 2024-08-26 ENCOUNTER — Encounter: Payer: Self-pay | Admitting: Certified Nurse Midwife

## 2024-09-12 NOTE — Patient Instructions (Signed)

## 2024-09-12 NOTE — Progress Notes (Addendum)
 Post Partum Visit Note  Susan Klein is a 28 y.o. G32P2002 female who presents for a postpartum visit. She is 6 weeks postpartum following a normal spontaneous vaginal delivery.  I have reviewed the prenatal and intrapartum course. The delivery was at [redacted]w[redacted]d  Anesthesia: epidural. Postpartum course has been uncomplicated. She does report intermittent episodes of epigastric pain and vomiting. Baby is doing well. Baby is feeding by breast. Bleeding has stopped. Bowel function is normal. Bladder function is normal. Patient is sexually active. Contraception method is IUD. Postpartum depression screening: negative.  She is  currently only taking labetalol  once a day.    The following portions of the patient's history were reviewed and updated as appropriate: allergies, current medications, and problem list.  Review of Systems Pertinent items are noted in HPI.  Objective:  BP 123/82   Pulse 69   Resp 16   Wt 176 lb 3.2 oz (79.9 kg)   LMP 11/07/2023 (Exact Date)   BMI 31.21 kg/m    General:  alert, cooperative, and appears stated age   Breasts:  Pt declines exam  Lungs: clear to auscultation bilaterally  Heart:  regular rate and rhythm, S1, S2 normal, no murmur, click, rub or gallop  Abdomen: soft, non-tender; bowel sounds normal; no masses,  no organomegaly   Wound: N/A          Pelvic exam: normal external genitalia, vulva, vagina, cervix, uterus and adnexa. 2-cm string-like skin tag noted below urethra. No tenderness or lesions.  Procedure Note Consent was obtained prior to the procedure. A bimanual exam was performed to determine the position of the uterus. A sterile speculum was placed in the vagina, and the cervix was visualized. Betadine was applied to the cervix. A paracervical block was placed with an injection of 5mL 1% lidocaine  at 3 o'clock and 9 o'clock. An Susan Klein was placed on the anterior lip of the cervix, and gentle traction was applied to straighten and stabilize  it. The uterus was sounded to about 6  cm. The IUD was inserted to the appropriate depth and the insertion tool was removed. The strings were trimmed to about 3 cm. Bleeding was minimal. Susan Klein tolerated the procedure well. Post-procedure care and warning signs were reviewed with Susan Klein   Assessment:    1. Postpartum care following vaginal delivery  2. GHTN  3. IUD placement   Normal postpartum exam.   Plan:   Essential components of care per ACOG recommendations:  1.  Mood and well being: Patient with negative depression screening today. Reviewed local resources for support.  - Patient tobacco use? Yes    2. Infant care and feeding:  -Patient currently breastmilk feeding?  Yes  -Social determinants of health (SDOH) reviewed in EPIC. No concerns 3. Sexuality, contraception and birth spacing - Patient does not want a pregnancy in the next year.  - Reviewed reproductive life planning. Reviewed contraceptive methods based on pt preferences and effectiveness.  Patient desired IUD or IUS today.     4. Sleep and fatigue -Encouraged family/partner/community support of 4 hrs of uninterrupted sleep to help with mood and fatigue  5. Physical Recovery  - Discussed patients delivery and complications. She describes her labor as good. - Patient had a NSVD. Patient had a lacial laceration. Perineal healing reviewed. Patient expressed understanding - Patient has urinary incontinence? No. - Patient is safe to resume physical and sexual activity  6.  Health Maintenance - Last pap smear  Diagnosis  Date Value  Ref Range Status  12/24/2022   Final   - Negative for intraepithelial lesion or malignancy (NILM)   Pap smear not done at today's visit.  -Breast Cancer screening indicated? No.   7. Chronic Disease/Pregnancy Condition follow up: Hypertension -Will stop labetalol  and return to clinic for BP check in one week. - PCP follow up  RUQ US  ordered.  Susan Klein Susan Klein, CNM [place] [facility] [place], [facility]

## 2024-09-14 ENCOUNTER — Ambulatory Visit (INDEPENDENT_AMBULATORY_CARE_PROVIDER_SITE_OTHER): Admitting: Obstetrics

## 2024-09-14 ENCOUNTER — Encounter: Payer: Self-pay | Admitting: Obstetrics

## 2024-09-14 DIAGNOSIS — Z30430 Encounter for insertion of intrauterine contraceptive device: Secondary | ICD-10-CM | POA: Diagnosis not present

## 2024-09-14 DIAGNOSIS — R1011 Right upper quadrant pain: Secondary | ICD-10-CM

## 2024-09-14 MED ORDER — PARAGARD INTRAUTERINE COPPER IU IUD
1.0000 | INTRAUTERINE_SYSTEM | Freq: Once | INTRAUTERINE | Status: AC
  Administered 2024-09-14: 1 via INTRAUTERINE

## 2024-09-16 NOTE — Addendum Note (Signed)
 Addended by: JUSTINO SETTER on: 09/16/2024 10:57 PM   Modules accepted: Orders

## 2024-09-19 NOTE — Addendum Note (Signed)
 Addended by: JUSTINO SETTER on: 09/19/2024 02:14 PM   Modules accepted: Level of Service

## 2024-09-21 ENCOUNTER — Ambulatory Visit

## 2024-09-21 VITALS — BP 114/63 | HR 83 | Ht 63.0 in | Wt 178.3 lb

## 2024-09-21 DIAGNOSIS — Z0130 Encounter for examination of blood pressure without abnormal findings: Secondary | ICD-10-CM

## 2024-09-21 NOTE — Progress Notes (Signed)
    NURSE VISIT NOTE  Subjective:    Patient ID: CHAMARA DYCK, female    DOB: 1995/11/04, 28 y.o.   MRN: [id number]  HPI  Patient is a 28 y.o. G70P2002 female who presents for BP check per order from [name] Canny, CNM.   Patient reports compliance with prescribed BP medications: Per [name] Canny, CNM note on 09/14/24, patient will stop Labetalol  and return to clinic for bp check.  Last dose of BP medication: 09/13/24 at 9:30pm  BP Readings from Last 3 Encounters:  09/21/24 114/63  09/14/24 123/82  08/22/24 120/61   Pulse Readings from Last 3 Encounters:  09/21/24 83  09/14/24 69  08/22/24 69    Objective:    BP 114/63   Pulse 83   Ht 5' 3 (1.6 m)   Wt 178 lb 4.8 oz (80.9 kg)   Breastfeeding Unknown   BMI 31.58 kg/m   Assessment:   1. BP check      Plan:   Per Dr. Slater Rains, CNM:  Continue current plan and follow up with PCP if any reading  >140/90 or with any associated symptoms. Patient verbalized understanding of instructions.   Mathis LITTIE Getting, CMA

## 2024-09-21 NOTE — Patient Instructions (Signed)
 High Blood Pressure After Pregnancy: What to Know High blood pressure after pregnancy, or postpartum hypertension, is blood pressure that is higher than normal after childbirth. It's most common within 1 to 2 days after delivery, but it can happen later. Sometimes, it can happen up to 12 weeks or more after pregnancy. Some people have to have medical treatment to control high blood pressure and prevent serious complications. What are the causes? The cause of this condition is not well understood. In some cases, the cause may not be known. Certain conditions may increase your risk. These include: Hypertension that existed before pregnancy (chronic hypertension). Hypertension that happens as a result of pregnancy (gestational hypertension). Hypertensive disorders during pregnancy or seizures in women who have high blood pressure during pregnancy. These conditions are called pre-eclampsia and eclampsia. A condition in which the liver, platelets, and red blood cells are damaged during pregnancy (HELLP syndrome). Obesity. Diabetes. What increases the risk? If you had blood pressure problems during pregnancy, you're more likely to get this condition after giving birth. However, you can still have high blood pressure after delivery even if you didn't have problems during pregnancy. What are the signs or symptoms? Signs and symptoms may include: Headaches. These may be mild, moderate, or severe. They may also be steady, constant, or sudden (thunderclap headache). Vision changes, such as blurry vision, flashing lights, or seeing spots. Nausea and vomiting. Pain in the upper right side of your abdomen. Shortness of breath or trouble breathing. Swelling in your face or hands. A decrease in the amount of urine that you pass. You may not have any signs or symptoms. How is this diagnosed? This condition may be diagnosed based on the results of a physical exam, blood pressure measurements, and blood and pee  (urine) tests. You may also have other tests, such as a CT scan or an MRI, to check for other problems. How is this treated? If blood pressure is high enough to require treatment, your options may include: Medicines to reduce blood pressure (antihypertensives). Tell your health care provider if you are breastfeeding or if you plan to breastfeed. There are many antihypertensive medicines that are safe to take while breastfeeding. Treating medical conditions that are causing hypertension. Treating the complications of hypertension, such as seizures, stroke, or kidney problems. Your health care provider will also continue to closely watch your blood pressure. Follow these instructions at home: Learn your goal blood pressure Two numbers make up your blood pressure. The first number is called systolic pressure. The second is called diastolic pressure. An example of a blood pressure reading is "120 over 80" (or 120/80). Ask your health care provider what your goal blood pressure is. Know how to take your blood pressure To check your blood pressure, follow the instructions in the manual that came with your blood pressure monitor. This includes any instructions on what to do before taking your blood pressure. Record your blood pressure readings Follow your health care provider's instructions on how to record your blood pressure readings. Your health care provider may ask you to: Get one reading in the morning (a.m.) before you take any medicines. Get one reading in the evening (p.m.) before supper. Take at least 2 readings with each blood pressure check. This makes sure the results are correct. Wait 1-2 minutes between measurements.  General instructions Take over-the-counter and prescription medicines only as told by your health care provider. Do not use any products that contain nicotine or tobacco. These products include cigarettes, chewing  tobacco, and vaping devices, such as e-cigarettes. If you  need help quitting, ask your health care provider. Check your blood pressure as often as told by your health care provider. Return to your normal activities as told by your health care provider. Ask your health care provider what activities are safe for you. Keep all follow-up visits. Your health care provider will continue to closely watch your blood pressure. Contact a health care provider if: You have new symptoms, such as: A headache that does not get better. Dizziness. Vision changes. Nausea and vomiting. Get help right away if: You have trouble breathing. You have chest pain. You faint. You have any symptoms of a stroke. "BE FAST" is an easy way to remember the main warning signs of a stroke: B - Balance. Signs are dizziness, sudden trouble walking, or loss of balance. E - Eyes. Signs are trouble seeing or a sudden change in vision. F - Face. Signs are sudden weakness or numbness of the face, or the face or eyelid drooping on one side. A - Arms. Signs are weakness or numbness in an arm. This happens suddenly and usually on one side of the body. S - Speech. Signs are sudden trouble speaking, slurred speech, or trouble understanding what people say. T - Time. Time to call emergency services. Write down what time symptoms started. You have other signs of a stroke, such as: A sudden, severe headache with no known cause. Nausea or vomiting. Seizure. These symptoms may be an emergency. Get help right away. Call 911. Do not wait to see if the symptoms will go away. Do not drive yourself to the hospital. This information is not intended to replace advice given to you by your health care provider. Make sure you discuss any questions you have with your health care provider. Document Revised: 05/20/2023 Document Reviewed: 01/07/2022 Elsevier Patient Education  2024 [employer].

## 2024-09-23 ENCOUNTER — Ambulatory Visit: Admission: RE | Admit: 2024-09-23 | Discharge: 2024-09-23 | Attending: Obstetrics

## 2024-09-23 DIAGNOSIS — R1011 Right upper quadrant pain: Secondary | ICD-10-CM | POA: Diagnosis not present

## 2024-09-23 DIAGNOSIS — K8020 Calculus of gallbladder without cholecystitis without obstruction: Secondary | ICD-10-CM | POA: Diagnosis not present

## 2024-09-30 DIAGNOSIS — Z419 Encounter for procedure for purposes other than remedying health state, unspecified: Secondary | ICD-10-CM | POA: Diagnosis not present

## 2024-10-05 ENCOUNTER — Ambulatory Visit: Payer: Self-pay | Admitting: Obstetrics

## 2024-10-05 ENCOUNTER — Other Ambulatory Visit: Payer: Self-pay | Admitting: Obstetrics

## 2024-10-05 DIAGNOSIS — K8020 Calculus of gallbladder without cholecystitis without obstruction: Secondary | ICD-10-CM

## 2024-10-05 NOTE — Progress Notes (Signed)
 Cholelithiasis without acute cholecystitis. Referral placed to general surgery.  Susan Klein, CNM

## 2024-10-11 NOTE — Progress Notes (Deleted)
" ° ° °  GYNECOLOGY OFFICE ENCOUNTER NOTE  History:  28 y.o. [id number] here today for today for IUD string check; Paragard   IUD was placed  09/14/24. No complaints about the IUD, no concerning side effects.  The following portions of the patient's history were reviewed and updated as appropriate: allergies, current medications, past family history, past medical history, past social history, past surgical history and problem list. Last pap smear on 12/24/22 was normal, negative HRHPV.  Review of Systems:  Pertinent items are noted in HPI.  Objective:  unknown if currently breastfeeding.  Physical Exam CONSTITUTIONAL: Well-developed, well-nourished female in no acute distress.  NEUROLOGIC: Alert and oriented to person, place, and time. Normal reflexes, muscle tone coordination.  ABDOMEN: Soft, no distention noted.   PELVIC: Normal appearing external genitalia; normal appearing vaginal mucosa and cervix.  IUD strings visualized, about *** cm in length outside cervix. Done in the presence of a chaperone.  EXTREMITIES: Non-tender, no edema or cyanosis  Assessment & Plan:  Patient to keep IUD in place for up to 10 years; can come in for removal if she desires pregnancy earlier or for any concerning side effects.    Melissa Swanson CNM [facility] at [facility]

## 2024-10-14 ENCOUNTER — Ambulatory Visit: Admitting: Obstetrics

## 2024-10-14 DIAGNOSIS — Z30431 Encounter for routine checking of intrauterine contraceptive device: Secondary | ICD-10-CM

## 2024-10-24 ENCOUNTER — Ambulatory Visit (INDEPENDENT_AMBULATORY_CARE_PROVIDER_SITE_OTHER): Admitting: Surgery

## 2024-10-24 ENCOUNTER — Encounter: Payer: Self-pay | Admitting: Surgery

## 2024-10-24 VITALS — BP 143/81 | HR 92 | Temp 98.4°F | Ht 63.0 in | Wt 175.6 lb

## 2024-10-24 DIAGNOSIS — K8020 Calculus of gallbladder without cholecystitis without obstruction: Secondary | ICD-10-CM | POA: Diagnosis not present
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
## 2024-10-24 NOTE — Patient Instructions (Signed)
 You have requested to have your gallbladder removed. This will be done at [facility] with Dr. Desiderio.   If you are on any injectable weight loss medication, you will need to stop taking your GLP-1 injectable (weight loss) medications 8 days before your surgery to avoid any complications with anesthesia.    You will most likely be out of work 1-2 weeks for this surgery.  If you have FMLA or disability paperwork that needs filled out you may drop this off at our office or this can be faxed to [phone].   You will return after your post-op appointment with a lifting restriction for approximately 4 more weeks.   You will be able to eat anything you would like to following surgery. But, start by eating a bland diet and advance this as tolerated. The Gallbladder diet is below, please go as closely by this diet as possible prior to surgery to avoid any further attacks.   Please see the (blue)pre-care form that you have been given today. You will receive several phone calls from [facility] prior to your surgery date.   If you have any questions, please call our office.   Laparoscopic Cholecystectomy Laparoscopic cholecystectomy is surgery to remove the gallbladder. The gallbladder is located in the upper right part of the abdomen, behind the liver. It is a storage sac for bile, which is produced in the liver. Bile aids in the digestion and absorption of fats. Cholecystectomy is often done for inflammation of the gallbladder (cholecystitis). This condition is usually caused by a buildup of gallstones (cholelithiasis) in the gallbladder. Gallstones can block the flow of bile, and that can result in inflammation and pain. In severe cases, emergency surgery may be required. If emergency surgery is not required, you will have time to prepare for the procedure. Laparoscopic surgery is an alternative to open surgery. Laparoscopic surgery has a shorter recovery time. Your common bile duct may also  need to be examined during the procedure. If stones are found in the common bile duct, they may be removed. LET [facility] CARE PROVIDER KNOW ABOUT: Any allergies you have. All medicines you are taking, including vitamins, herbs, eye drops, creams, and over-the-counter medicines. Previous problems you or members of your family have had with the use of anesthetics. Any blood disorders you have. Previous surgeries you have had.  Any medical conditions you have. RISKS AND COMPLICATIONS Generally, this is a safe procedure. However, problems may occur, including: Infection. Bleeding. Allergic reactions to medicines. Damage to other structures or organs. A stone remaining in the common bile duct. A bile leak from the cyst duct that is clipped when your gallbladder is removed. The need to convert to open surgery, which requires a larger incision in the abdomen. This may be necessary if your surgeon thinks that it is not safe to continue with a laparoscopic procedure. BEFORE THE PROCEDURE Ask your health care provider about: Changing or stopping your regular medicines. This is especially important if you are taking diabetes medicines or blood thinners. Taking medicines such as aspirin and ibuprofen . These medicines can thin your blood. Do not take these medicines before your procedure if your health care provider instructs you not to. Follow instructions from your health care provider about eating or drinking restrictions. Let your health care provider know if you develop a cold or an infection before surgery. Plan to have someone take you home after the procedure. Ask your health care provider how your surgical site will be  marked or identified. You may be given antibiotic medicine to help prevent infection. PROCEDURE To reduce your risk of infection: Your health care team will wash or sanitize their hands. Your skin will be washed with soap. An IV tube may be inserted into one of your  veins. You will be given a medicine to make you fall asleep (general anesthetic). A breathing tube will be placed in your mouth. The surgeon will make several small cuts (incisions) in your abdomen. A thin, lighted tube (laparoscope) that has a tiny camera on the end will be inserted through one of the small incisions. The camera on the laparoscope will send a picture to a TV screen (monitor) in the operating room. This will give the surgeon a good view inside your abdomen. A gas will be pumped into your abdomen. This will expand your abdomen to give the surgeon more room to perform the surgery. Other tools that are needed for the procedure will be inserted through the other incisions. The gallbladder will be removed through one of the incisions. After your gallbladder has been removed, the incisions will be closed with stitches (sutures), staples, or skin glue. Your incisions may be covered with a bandage (dressing). The procedure may vary among health care providers and hospitals. AFTER THE PROCEDURE Your blood pressure, heart rate, breathing rate, and blood oxygen level will be monitored often until the medicines you were given have worn off. You will be given medicines as needed to control your pain.   This information is not intended to replace advice given to you by your health care provider. Make sure you discuss any questions you have with your health care provider.   Document Released: 10/06/2005 Document Revised: 06/27/2015 Document Reviewed: 05/18/2013 Elsevier Interactive Patient Education 2016 Else[employer] Inc.     Low-Fat Diet for Gallbladder Conditions A low-fat diet can be helpful if you have pancreatitis or a gallbladder condition. With these conditions, your pancreas and gallbladder have trouble digesting fats. A healthy eating plan with less fat will help rest your pancreas and gallbladder and reduce your symptoms. WHAT DO I NEED TO KNOW ABOUT THIS DIET? Eat a low-fat  diet. Reduce your fat intake to less than 20-30% of your total daily calories. This is less than 50-60 g of fat per day. Remember that you need some fat in your diet. Ask your dietician what your daily goal should be. Choose nonfat and low-fat healthy foods. Look for the words nonfat, low fat, or fat free. As a guide, look on the label and choose foods with less than 3 g of fat per serving. Eat only one serving. Avoid alcohol. Do not smoke. If you need help quitting, talk with your health care provider. Eat small frequent meals instead of three large heavy meals. WHAT FOODS CAN I EAT? Grains Include healthy grains and starches such as potatoes, wheat bread, fiber-rich cereal, and brown rice. Choose whole grain options whenever possible. In adults, whole grains should account for 45-65% of your daily calories.  Fruits and Vegetables Eat plenty of fruits and vegetables. Fresh fruits and vegetables add fiber to your diet. Meats and Other Protein Sources Eat lean meat such as chicken and pork. Trim any fat off of meat before cooking it. Eggs, fish, and beans are other sources of protein. In adults, these foods should account for 10-35% of your daily calories. Dairy Choose low-fat milk and dairy options. Dairy includes fat and protein, as well as calcium.  Fats and Oils Limit high-fat  foods such as fried foods, sweets, baked goods, sugary drinks.  Other Creamy sauces and condiments, such as mayonnaise, can add extra fat. Think about whether or not you need to use them, or use smaller amounts or low fat options. WHAT FOODS ARE NOT RECOMMENDED? High fat foods, such as: [employer]. Ice cream. French toast. Sweet rolls. Pizza. Cheese bread. Foods covered with batter, butter, creamy sauces, or cheese. Fried foods. Sugary drinks and desserts. Foods that cause gas or bloating   This information is not intended to replace advice given to you by your health care provider. Make sure you  discuss any questions you have with your health care provider.   Document Released: 10/11/2013 Document Reviewed: 10/11/2013 Elsevier Interactive Patient Education [employer]

## 2024-10-25 ENCOUNTER — Telehealth: Payer: Self-pay | Admitting: Surgery

## 2024-10-25 NOTE — Telephone Encounter (Signed)
 Patient has been advised of Pre-Admission date/time, and Surgery date at [facility].  Surgery Date: 11/03/24 Preadmission Testing Date: 10/31/24 (phone 1p-4p)  Patient informed of the scheduling process and surgery information given at time of office visit.  Patient has been made aware to call [phone], between 1-3:00pm the day before surgery, to find out what time to arrive for surgery.

## 2024-10-31 ENCOUNTER — Encounter
Admission: RE | Admit: 2024-10-31 | Discharge: 2024-10-31 | Disposition: A | Discharge status: Home / Self Care | Source: Ambulatory Visit | Attending: Surgery | Admitting: Surgery

## 2024-10-31 ENCOUNTER — Other Ambulatory Visit: Payer: Self-pay

## 2024-10-31 DIAGNOSIS — Z975 Presence of (intrauterine) contraceptive device: Secondary | ICD-10-CM

## 2024-10-31 HISTORY — DX: Nausea with vomiting, unspecified: R11.2

## 2024-10-31 NOTE — Patient Instructions (Addendum)
 Your procedure is scheduled on: Thursday 11/03/24 To find out your arrival time, please call [phone] between 1PM - 3PM on: Wednesday 11/02/24 Report to the Registration Desk on the 1st floor of the [facility]. If your arrival time is 6:00 am, do not arrive before that time as the [facility] entrance doors do not open until 6:00 am.  REMEMBER: Instructions that are not followed completely may result in serious medical risk, up to and including death; or upon the discretion of your surgeon and anesthesiologist your surgery may need to be rescheduled.  Do not eat food after midnight the night before surgery.  No gum chewing or hard candies.  You may however, drink CLEAR liquids up to 2 hours before you are scheduled to arrive for your surgery. Do not drink anything within 2 hours of your scheduled arrival time.  Clear liquids include: - water  - apple juice without pulp - gatorade (not RED colors) - black coffee or tea (Do NOT add milk or creamers to the coffee or tea) Do NOT drink anything that is not on this list.  One week prior to surgery: Stop Anti-inflammatories (NSAIDS) such as Advil , Aleve, Ibuprofen , Motrin , Naproxen, Naprosyn and Aspirin  based products such as Excedrin, Goody's Powder, BC Powder.  You may however, continue to take Tylenol  if needed for pain up until the day of surgery.  Stop ANY OVER THE COUNTER supplements and vitamins for at least 7 days until after surgery. You do not have to stop taking your stool softener.  Continue taking all of your other prescription medications up until the day of surgery.  ON THE DAY OF SURGERY ONLY TAKE THESE MEDICATIONS WITH SIPS OF WATER:  none  No Alcohol for 24 hours before or after surgery.  No Smoking including e-cigarettes for 24 hours before surgery.  No chewable tobacco products for at least 6 hours before surgery.  No nicotine  patches on the day of surgery.  Do not use any recreational drugs for at least  a week (preferably 2 weeks) before your surgery.  Please be advised that the combination of cocaine and anesthesia may have negative outcomes, up to and including death. If you test positive for cocaine, your surgery will be cancelled.  On the morning of surgery brush your teeth with toothpaste and water, you may rinse your mouth with mouthwash if you wish. Do not swallow any toothpaste or mouthwash.  Use CHG Soap or wipes as directed on instruction sheet. (You can pick this up at our office in the [facility], the building to the left of the [employer], [address] at [address]  Do not shave body hair from the neck down 48 hours before surgery.  Do not wear lotions, powders, or perfumes on the day of surgery  Wear comfortable clothing (specific to your surgery type) to the hospital.  Do not wear jewelry, make-up, hairpins, clips or nail polish.  For welded (permanent) jewelry: bracelets, anklets, waist bands, etc.  Please have this removed prior to surgery.  If it is not removed, there is a chance that hospital personnel will need to cut it off on the day of surgery.  Contact lenses, hearing aids and dentures may not be worn into surgery.  Do not bring valuables to the hospital. [facility] is not responsible for any missing/lost belongings or valuables.   Notify your doctor if there is any change in your medical condition (cold, fever, infection).  After  surgery, you can help prevent lung complications by doing breathing exercises.  Take deep breaths and cough every 1-2 hours. Your doctor may order a device called an Incentive Spirometer to help you take deep breaths.  When coughing or sneezing, hold a pillow firmly against your incision with both hands. This is called splinting. Doing this helps protect your incision. It also decreases belly discomfort.  If you are being discharged the day of surgery, you will not be allowed to drive home. You will  need a responsible individual to drive you home and stay with you for 24 hours after surgery.   Please call the Pre-admissions Testing Dept. at [phone] if you have any questions about these instructions.  Surgery Visitation Policy:  Patients having surgery or a procedure may have two visitors.  Children under the age of 64 must have an adult with them who is not the patient.  Merchandiser, Retail to address health-related social needs:  [email or web address]                                                                                                             Preparing for Surgery with CHLORHEXIDINE  GLUCONATE (CHG) Soap  Chlorhexidine  Gluconate (CHG) Soap  o An antiseptic cleaner that kills germs and bonds with the skin to continue killing germs even after washing  o Used for showering the night before surgery and morning of surgery  Before surgery, you can play an important role by reducing the number of germs on your skin.  CHG (Chlorhexidine  gluconate) soap is an antiseptic cleanser which kills germs and bonds with the skin to continue killing germs even after washing.  Please do not use if you have an allergy to CHG or antibacterial soaps. If your skin becomes reddened/irritated stop using the CHG.  1. Shower the NIGHT BEFORE SURGERY with CHG soap.  2. If you choose to wash your hair, wash your hair first as usual with your normal shampoo.  3. After shampooing, rinse your hair and body thoroughly to remove the shampoo.  4. Use CHG as you would any other liquid soap. You can apply CHG directly to the skin and wash gently with a clean washcloth.  5. Apply the CHG soap to your body only from the neck down. Do not use on open wounds or open sores. Avoid contact with your eyes, ears, mouth, and genitals (private parts). Wash face and genitals (private parts) with your normal soap.  6. Wash thoroughly, paying special attention to the area where your  surgery will be performed.  7. Thoroughly rinse your body with warm water.  8. Do not shower/wash with your normal soap after using and rinsing off the CHG soap.  9. Do not use lotions, oils, etc., after showering with CHG.  10. Pat yourself dry with a clean towel.  11. Wear clean pajamas to bed the night before surgery.  12. Place clean sheets on your bed the night of your shower and do not sleep with pets.  13. Do not apply any  deodorants/lotions/powders.  14. Please wear clean clothes to the hospital.  15. Remember to brush your teeth with your regular toothpaste.

## 2024-11-03 ENCOUNTER — Ambulatory Visit: Admitting: Anesthesiology

## 2024-11-03 ENCOUNTER — Encounter: Admission: RE | Disposition: A | Payer: Self-pay | Source: Home / Self Care | Attending: Surgery

## 2024-11-03 ENCOUNTER — Encounter: Payer: Self-pay | Admitting: Surgery

## 2024-11-03 ENCOUNTER — Other Ambulatory Visit: Payer: Self-pay

## 2024-11-03 ENCOUNTER — Ambulatory Visit
Admission: RE | Admit: 2024-11-03 | Discharge: 2024-11-03 | Disposition: A | Discharge status: Home / Self Care | Attending: Surgery | Admitting: Surgery

## 2024-11-03 DIAGNOSIS — Z79899 Other long term (current) drug therapy: Secondary | ICD-10-CM | POA: Insufficient documentation

## 2024-11-03 DIAGNOSIS — K8020 Calculus of gallbladder without cholecystitis without obstruction: Secondary | ICD-10-CM

## 2024-11-03 DIAGNOSIS — F32A Depression, unspecified: Secondary | ICD-10-CM | POA: Insufficient documentation

## 2024-11-03 DIAGNOSIS — Z975 Presence of (intrauterine) contraceptive device: Secondary | ICD-10-CM

## 2024-11-03 DIAGNOSIS — F419 Anxiety disorder, unspecified: Secondary | ICD-10-CM | POA: Insufficient documentation

## 2024-11-03 DIAGNOSIS — D649 Anemia, unspecified: Secondary | ICD-10-CM | POA: Insufficient documentation

## 2024-11-03 DIAGNOSIS — K219 Gastro-esophageal reflux disease without esophagitis: Secondary | ICD-10-CM | POA: Diagnosis not present

## 2024-11-03 DIAGNOSIS — K828 Other specified diseases of gallbladder: Secondary | ICD-10-CM | POA: Insufficient documentation

## 2024-11-03 DIAGNOSIS — K8010 Calculus of gallbladder with chronic cholecystitis without obstruction: Secondary | ICD-10-CM | POA: Diagnosis not present

## 2024-11-03 LAB — POCT PREGNANCY, URINE: Preg Test, Ur: NEGATIVE

## 2024-11-03 MED ORDER — BUPIVACAINE-EPINEPHRINE (PF) 0.5% -1:200000 IJ SOLN
INTRAMUSCULAR | Status: AC
  Filled 2024-11-03: qty 30

## 2024-11-03 MED ORDER — DEXAMETHASONE SOD PHOSPHATE PF 10 MG/ML IJ SOLN
INTRAMUSCULAR | Status: AC
  Filled 2024-11-03: qty 1

## 2024-11-03 MED ORDER — INDOCYANINE GREEN 25 MG IJ SOLR
1.2500 mg | INTRAMUSCULAR | Status: AC
  Administered 2024-11-03: 1.25 mg via INTRAVENOUS

## 2024-11-03 MED ORDER — BUPIVACAINE-EPINEPHRINE (PF) 0.5% -1:200000 IJ SOLN
INTRAMUSCULAR | Status: DC | PRN
  Administered 2024-11-03: 30 mL via PERINEURAL

## 2024-11-03 MED ORDER — HYDROMORPHONE HCL 1 MG/ML IJ SOLN
INTRAMUSCULAR | Status: AC
  Filled 2024-11-03: qty 1

## 2024-11-03 MED ORDER — DROPERIDOL 2.5 MG/ML IJ SOLN
INTRAMUSCULAR | Status: AC
  Filled 2024-11-03: qty 2

## 2024-11-03 MED ORDER — OXYCODONE HCL 5 MG PO TABS
ORAL_TABLET | ORAL | Status: AC
  Filled 2024-11-03: qty 1

## 2024-11-03 MED ORDER — ONDANSETRON HCL 4 MG/2ML IJ SOLN
INTRAMUSCULAR | Status: DC | PRN
  Administered 2024-11-03: 4 mg via INTRAVENOUS

## 2024-11-03 MED ORDER — FENTANYL CITRATE (PF) 100 MCG/2ML IJ SOLN
INTRAMUSCULAR | Status: AC
  Filled 2024-11-03: qty 2

## 2024-11-03 MED ORDER — KETOROLAC TROMETHAMINE 30 MG/ML IJ SOLN
INTRAMUSCULAR | Status: DC | PRN
  Administered 2024-11-03: 15 mg via INTRAVENOUS

## 2024-11-03 MED ORDER — LIDOCAINE HCL (PF) 2 % IJ SOLN
INTRAMUSCULAR | Status: AC
  Filled 2024-11-03: qty 5

## 2024-11-03 MED ORDER — ORAL CARE MOUTH RINSE: 15.0000 mL | Freq: Once | OROMUCOSAL | Status: AC

## 2024-11-03 MED ORDER — MIDAZOLAM HCL 2 MG/2ML IJ SOLN
INTRAMUSCULAR | Status: AC
  Filled 2024-11-03: qty 2

## 2024-11-03 MED ORDER — KETOROLAC TROMETHAMINE 30 MG/ML IJ SOLN
INTRAMUSCULAR | Status: AC
  Filled 2024-11-03: qty 1

## 2024-11-03 MED ORDER — SUCCINYLCHOLINE CHLORIDE 200 MG/10ML IV SOSY
PREFILLED_SYRINGE | INTRAVENOUS | Status: AC
  Filled 2024-11-03: qty 10

## 2024-11-03 MED ORDER — DEXAMETHASONE SOD PHOSPHATE PF 10 MG/ML IJ SOLN
INTRAMUSCULAR | Status: DC | PRN
  Administered 2024-11-03: 5 mg via INTRAVENOUS

## 2024-11-03 MED ORDER — LIDOCAINE HCL (CARDIAC) PF 100 MG/5ML IV SOSY
PREFILLED_SYRINGE | INTRAVENOUS | Status: DC | PRN
  Administered 2024-11-03: 80 mg via INTRAVENOUS

## 2024-11-03 MED ORDER — ORAL CARE MOUTH RINSE: 15.0000 mL | Freq: Once | OROMUCOSAL | Status: DC

## 2024-11-03 MED ORDER — 0.9 % SODIUM CHLORIDE (POUR BTL) OPTIME
TOPICAL | Status: DC | PRN
  Administered 2024-11-03: 500 mL

## 2024-11-03 MED ORDER — SUGAMMADEX SODIUM 200 MG/2ML IV SOLN
INTRAVENOUS | Status: DC | PRN
  Administered 2024-11-03: 200 mg via INTRAVENOUS

## 2024-11-03 MED ORDER — PHENYLEPHRINE 80 MCG/ML (10ML) SYRINGE FOR IV PUSH (FOR BLOOD PRESSURE SUPPORT)
PREFILLED_SYRINGE | INTRAVENOUS | Status: DC | PRN
  Administered 2024-11-03: 80 ug via INTRAVENOUS

## 2024-11-03 MED ORDER — ROCURONIUM BROMIDE 10 MG/ML (PF) SYRINGE
PREFILLED_SYRINGE | INTRAVENOUS | Status: AC
  Filled 2024-11-03: qty 10

## 2024-11-03 MED ORDER — ACETAMINOPHEN 500 MG PO TABS: 1000.0000 mg | ORAL_TABLET | Freq: Four times a day (QID) | ORAL | Status: AC | PRN

## 2024-11-03 MED ORDER — CHLORHEXIDINE GLUCONATE 0.12 % MT SOLN
15.0000 mL | Freq: Once | OROMUCOSAL | Status: AC
  Administered 2024-11-03: 15 mL via OROMUCOSAL

## 2024-11-03 MED ORDER — FENTANYL CITRATE (PF) 100 MCG/2ML IJ SOLN
INTRAMUSCULAR | Status: DC | PRN
  Administered 2024-11-03 (×2): 50 ug via INTRAVENOUS

## 2024-11-03 MED ORDER — CHLORHEXIDINE GLUCONATE 0.12 % MT SOLN
OROMUCOSAL | Status: AC
  Filled 2024-11-03: qty 15

## 2024-11-03 MED ORDER — CEFAZOLIN SODIUM-DEXTROSE 2-4 GM/100ML-% IV SOLN
INTRAVENOUS | Status: AC
  Filled 2024-11-03: qty 100

## 2024-11-03 MED ORDER — INDOCYANINE GREEN 25 MG IJ SOLR
INTRAMUSCULAR | Status: AC
  Filled 2024-11-03: qty 10

## 2024-11-03 MED ORDER — CHLORHEXIDINE GLUCONATE CLOTH 2 % EX PADS
6.0000 | MEDICATED_PAD | Freq: Once | CUTANEOUS | Status: AC
  Administered 2024-11-03: 6 via TOPICAL

## 2024-11-03 MED ORDER — OXYCODONE HCL 5 MG PO TABS
5.0000 mg | ORAL_TABLET | Freq: Once | ORAL | Status: AC | PRN
  Administered 2024-11-03: 5 mg via ORAL

## 2024-11-03 MED ORDER — GABAPENTIN 300 MG PO CAPS
300.0000 mg | ORAL_CAPSULE | ORAL | Status: AC
  Administered 2024-11-03: 300 mg via ORAL

## 2024-11-03 MED ORDER — PHENYLEPHRINE 80 MCG/ML (10ML) SYRINGE FOR IV PUSH (FOR BLOOD PRESSURE SUPPORT)
PREFILLED_SYRINGE | INTRAVENOUS | Status: AC
  Filled 2024-11-03: qty 10

## 2024-11-03 MED ORDER — CHLORHEXIDINE GLUCONATE CLOTH 2 % EX PADS: 6.0000 | MEDICATED_PAD | Freq: Once | CUTANEOUS | Status: DC

## 2024-11-03 MED ORDER — BUPIVACAINE LIPOSOME 1.3 % IJ SUSP: 10.0000 mL | Freq: Once | INTRAMUSCULAR | Status: DC

## 2024-11-03 MED ORDER — OXYCODONE HCL 5 MG/5ML PO SOLN: 5.0000 mg | Freq: Once | ORAL | Status: AC | PRN

## 2024-11-03 MED ORDER — PROPOFOL 10 MG/ML IV BOLUS
INTRAVENOUS | Status: DC | PRN
  Administered 2024-11-03: 160 mg via INTRAVENOUS

## 2024-11-03 MED ORDER — ROCURONIUM BROMIDE 100 MG/10ML IV SOLN
INTRAVENOUS | Status: DC | PRN
  Administered 2024-11-03: 60 mg via INTRAVENOUS
  Administered 2024-11-03: 20 mg via INTRAVENOUS

## 2024-11-03 MED ORDER — OXYCODONE HCL 5 MG PO TABS: 5.0000 mg | ORAL_TABLET | ORAL | 0 refills | Status: AC | PRN

## 2024-11-03 MED ORDER — IBUPROFEN 600 MG PO TABS: 600.0000 mg | ORAL_TABLET | Freq: Three times a day (TID) | ORAL | 1 refills | Status: AC | PRN

## 2024-11-03 MED ORDER — ACETAMINOPHEN 500 MG PO TABS
ORAL_TABLET | ORAL | Status: AC
  Filled 2024-11-03: qty 2

## 2024-11-03 MED ORDER — LACTATED RINGERS IV SOLN: INTRAVENOUS | Status: DC

## 2024-11-03 MED ORDER — PROPOFOL 10 MG/ML IV BOLUS
INTRAVENOUS | Status: AC
  Filled 2024-11-03: qty 20

## 2024-11-03 MED ORDER — MIDAZOLAM HCL (PF) 2 MG/2ML IJ SOLN
INTRAMUSCULAR | Status: DC | PRN
  Administered 2024-11-03: 2 mg via INTRAVENOUS

## 2024-11-03 MED ORDER — HYDROMORPHONE HCL 1 MG/ML IJ SOLN
0.2500 mg | INTRAMUSCULAR | Status: DC | PRN
  Administered 2024-11-03 (×2): 0.5 mg via INTRAVENOUS

## 2024-11-03 MED ORDER — ACETAMINOPHEN 500 MG PO TABS
1000.0000 mg | ORAL_TABLET | ORAL | Status: AC
  Administered 2024-11-03: 1000 mg via ORAL

## 2024-11-03 MED ORDER — CEFAZOLIN SODIUM-DEXTROSE 2-4 GM/100ML-% IV SOLN
2.0000 g | INTRAVENOUS | Status: AC
  Administered 2024-11-03: 2 g via INTRAVENOUS

## 2024-11-03 MED ORDER — GABAPENTIN 300 MG PO CAPS
ORAL_CAPSULE | ORAL | Status: AC
  Filled 2024-11-03: qty 1

## 2024-11-03 MED ORDER — CHLORHEXIDINE GLUCONATE 0.12 % MT SOLN: 15.0000 mL | Freq: Once | OROMUCOSAL | Status: DC

## 2024-11-03 MED ORDER — DEXMEDETOMIDINE HCL IN NACL 80 MCG/20ML IV SOLN
INTRAVENOUS | Status: DC | PRN
  Administered 2024-11-03: 14 ug via INTRAVENOUS
  Administered 2024-11-03: 12 ug via INTRAVENOUS

## 2024-11-03 MED ORDER — DROPERIDOL 2.5 MG/ML IJ SOLN
0.6250 mg | Freq: Once | INTRAMUSCULAR | Status: AC
  Administered 2024-11-03: 0.625 mg via INTRAVENOUS
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
## 2024-11-03 NOTE — Interval H&P Note (Signed)
 History and Physical Interval Note:  11/03/2024 10:01 AM  Susan Klein  has presented today for surgery, with the diagnosis of Symptomatic Cholelithiasis.  The various methods of treatment have been discussed with the patient and family. After consideration of risks, benefits and other options for treatment, the patient has consented to  Procedures with comments: CHOLECYSTECTOMY, ROBOT-ASSISTED, LAPAROSCOPIC (N/A) - With ICG as a surgical intervention.  The patient's history has been reviewed, patient examined, no change in status, stable for surgery.  I have reviewed the patient's chart and labs.  Questions were answered to the patient's satisfaction.     [name]

## 2024-11-03 NOTE — Anesthesia Procedure Notes (Cosign Needed)
 Procedure Name: Intubation Date/Time: 11/03/2024 10:21 AM  Performed by: Chesley Lendia CROME, MDPre-anesthesia Checklist: Patient identified and Emergency Drugs available Patient Re-evaluated:Patient Re-evaluated prior to induction Oxygen Delivery Method: Circle system utilized Preoxygenation: Pre-oxygenation with 100% oxygen Induction Type: IV induction Ventilation: Mask ventilation without difficulty Laryngoscope Size: McGrath and 3 Grade View: Grade I Tube type: Oral Tube size: 7.0 mm Number of attempts: 1 Airway Equipment and Method: Stylet and Oral airway Placement Confirmation: ETT inserted through vocal cords under direct vision, positive ETCO2 and breath sounds checked- equal and bilateral Secured at: 22 cm Tube secured with: Tape Dental Injury: Teeth and Oropharynx as per pre-operative assessment

## 2024-11-03 NOTE — Discharge Instructions (Signed)

## 2024-11-03 NOTE — Transfer of Care (Cosign Needed Addendum)
 Immediate Anesthesia Transfer of Care Note  Patient: Susan Klein  Procedure(s) Performed: CHOLECYSTECTOMY, ROBOT-ASSISTED, LAPAROSCOPIC  Patient Location: PACU  Anesthesia Type:General  Level of Consciousness: drowsy and patient cooperative  Airway & Oxygen Therapy: Patient Spontanous Breathing and Patient connected to face mask oxygen  Post-op Assessment: Report given to RN and Post -op Vital signs reviewed and stable  Post vital signs: Reviewed and stable  Last Vitals:  Vitals Value Taken Time  BP    Temp    Pulse 73 11/03/24 11:47  Resp    SpO2 100 % 11/03/24 11:47  Vitals shown include unfiled device data.  Last Pain:  Vitals:   11/03/24 0955  TempSrc: Temporal  PainSc: 0-No pain         Complications: No notable events documented.

## 2024-11-03 NOTE — Op Note (Signed)
" °  Procedure Date:  11/03/2024  Pre-operative Diagnosis:  Symptomatic cholelithiasis  Post-operative Diagnosis: Symptomatic cholelithiasis  Procedure:  Robotic assisted cholecystectomy with ICG FireFly cholangiogram  Surgeon:  Aloysius Sheree Plant, MD  Anesthesia:  General endotracheal  Estimated Blood Loss:  10 ml  Specimens:  gallbladder  Complications:  None  Indications for Procedure:  This is a 29 y.o. female who presents with abdominal pain and workup revealing symptomatic cholelithiasis.  The benefits, complications, treatment options, and expected outcomes were discussed with the patient. The risks of bleeding, infection, recurrence of symptoms, failure to resolve symptoms, bile duct damage, bile duct leak, retained common bile duct stone, bowel injury, and need for further procedures were all discussed with the patient and she was willing to proceed.  Description of Procedure: The patient was correctly identified in the preoperative area and brought into the operating room.  The patient was placed supine with VTE prophylaxis in place.  Appropriate time-outs were performed.  Anesthesia was induced and the patient was intubated.  Appropriate antibiotics were infused.  The abdomen was prepped and draped in a sterile fashion. An infraumbilical incision was made. A cutdown technique was used to enter the abdominal cavity without injury, and a 12 mm robotic port was inserted.  Pneumoperitoneum was obtained with appropriate opening pressures.  Three 8-mm ports were placed in the mid abdomen at the level of the umbilicus under direct visualization.  The DaVinci platform was docked, camera targeted, and instruments were placed under direct visualization.  The gallbladder was identified.  The fundus was grasped and retracted cephalad.  Adhesions were lysed bluntly and with electrocautery. The infundibulum was grasped and retracted laterally, exposing the peritoneum overlying the gallbladder.  This  was incised with electrocautery and extended on either side of the gallbladder.  FireFly cholangiogram was then obtained, and we were able to clearly identify the cystic duct and common bile duct.  The cystic duct and cystic artery were carefully dissected with combination of cautery and blunt dissection.  Both were clipped twice proximally and once distally, cutting in between.  The gallbladder was taken from the gallbladder fossa in a retrograde fashion with electrocautery. The gallbladder was placed in an Endocatch bag. The liver bed was inspected and any bleeding was controlled with electrocautery. The right upper quadrant was then inspected again revealing intact clips, no bleeding, and no ductal injury.    The 8 mm ports were removed under direct visualization and the 12 mm port was removed.  The Endocatch bag was brought out via the umbilical incision. The fascial opening was closed using 0 vicryl suture.  Local anesthetic was infused in all incisions and the incisions were closed with 4-0 Monocryl.  The wounds were cleaned and sealed with DermaBond.  The patient was emerged from anesthesia and extubated and brought to the recovery room for further management.  The patient tolerated the procedure well and all counts were correct at the end of the case.   Aloysius Sheree Plant, MD     "

## 2024-11-03 NOTE — Anesthesia Postprocedure Evaluation (Signed)
"   Anesthesia Post Note  Patient: [name]  Procedure(s) Performed: CHOLECYSTECTOMY, ROBOT-ASSISTED, LAPAROSCOPIC  Patient location during evaluation: PACU Anesthesia Type: General Level of consciousness: awake and alert Pain management: pain level controlled Vital Signs Assessment: post-procedure vital signs reviewed and stable Respiratory status: spontaneous breathing, nonlabored ventilation, respiratory function stable and patient connected to nasal cannula oxygen Cardiovascular status: blood pressure returned to baseline and stable Postop Assessment: no apparent nausea or vomiting Anesthetic complications: no   No notable events documented.   Last Vitals:  Vitals:   11/03/24 1300 11/03/24 1325  BP: (!) 141/90 121/78  Pulse: 75 70  Resp:  17  Temp:  36.6 C  SpO2: 97% 99%    Last Pain:  Vitals:   11/03/24 1325  TempSrc: Temporal  PainSc: 2                  [name]      "

## 2024-11-04 LAB — SURGICAL PATHOLOGY

## 2024-11-21 ENCOUNTER — Encounter: Admitting: Surgery

## 2024-12-07 ENCOUNTER — Encounter: Admitting: Surgery
